# Patient Record
Sex: Male | Born: 1960
Health system: Southern US, Community
[De-identification: ages and names within clinical notes are randomized; demographics above are authoritative.]

## PROBLEM LIST (undated history)

## (undated) DIAGNOSIS — F329 Major depressive disorder, single episode, unspecified: Secondary | ICD-10-CM

## (undated) DIAGNOSIS — E785 Hyperlipidemia, unspecified: Secondary | ICD-10-CM

## (undated) DIAGNOSIS — K2970 Gastritis, unspecified, without bleeding: Secondary | ICD-10-CM

## (undated) DIAGNOSIS — Z8619 Personal history of other infectious and parasitic diseases: Secondary | ICD-10-CM

## (undated) DIAGNOSIS — M7500 Adhesive capsulitis of unspecified shoulder: Secondary | ICD-10-CM

## (undated) DIAGNOSIS — F419 Anxiety disorder, unspecified: Secondary | ICD-10-CM

## (undated) DIAGNOSIS — F32A Depression, unspecified: Secondary | ICD-10-CM

## (undated) DIAGNOSIS — B009 Herpesviral infection, unspecified: Secondary | ICD-10-CM

## (undated) DIAGNOSIS — I10 Essential (primary) hypertension: Secondary | ICD-10-CM

## (undated) DIAGNOSIS — C4491 Basal cell carcinoma of skin, unspecified: Secondary | ICD-10-CM

## (undated) DIAGNOSIS — I5189 Other ill-defined heart diseases: Secondary | ICD-10-CM

## (undated) HISTORY — PX: UPPER GASTROINTESTINAL ENDOSCOPY: SHX188

## (undated) HISTORY — DX: Gastritis, unspecified, without bleeding: K29.70

## (undated) HISTORY — DX: Basal cell carcinoma of skin, unspecified: C44.91

## (undated) HISTORY — DX: Hyperlipidemia, unspecified: E78.5

## (undated) HISTORY — DX: Major depressive disorder, single episode, unspecified: F32.9

## (undated) HISTORY — DX: Herpesviral infection, unspecified: B00.9

## (undated) HISTORY — DX: Depression, unspecified: F32.A

## (undated) HISTORY — DX: Other ill-defined heart diseases: I51.89

## (undated) HISTORY — DX: Personal history of other infectious and parasitic diseases: Z86.19

## (undated) HISTORY — DX: Adhesive capsulitis of unspecified shoulder: M75.00

## (undated) HISTORY — PX: COLONOSCOPY: SHX174

## (undated) HISTORY — DX: Essential (primary) hypertension: I10

## (undated) HISTORY — DX: Anxiety disorder, unspecified: F41.9

---

## 1998-06-12 HISTORY — PX: KNEE SURGERY: SHX244

## 2004-06-12 DIAGNOSIS — C4491 Basal cell carcinoma of skin, unspecified: Secondary | ICD-10-CM

## 2004-06-12 HISTORY — DX: Basal cell carcinoma of skin, unspecified: C44.91

## 2009-05-05 ENCOUNTER — Emergency Department (HOSPITAL_COMMUNITY): Admission: EM | Admit: 2009-05-05 | Discharge: 2009-05-05 | Payer: Self-pay | Admitting: Emergency Medicine

## 2010-09-14 LAB — DIFFERENTIAL
Eosinophils Relative: 1 % (ref 0–5)
Lymphocytes Relative: 37 % (ref 12–46)
Lymphs Abs: 2.6 10*3/uL (ref 0.7–4.0)
Neutro Abs: 3.8 10*3/uL (ref 1.7–7.7)
Neutrophils Relative %: 54 % (ref 43–77)

## 2010-09-14 LAB — CBC
MCHC: 34.8 g/dL (ref 30.0–36.0)
MCV: 90.2 fL (ref 78.0–100.0)
RBC: 5.19 MIL/uL (ref 4.22–5.81)

## 2010-09-14 LAB — COMPREHENSIVE METABOLIC PANEL
AST: 21 U/L (ref 0–37)
BUN: 11 mg/dL (ref 6–23)
CO2: 25 mEq/L (ref 19–32)
Calcium: 9.5 mg/dL (ref 8.4–10.5)
Creatinine, Ser: 1.02 mg/dL (ref 0.4–1.5)
GFR calc Af Amer: >60 mL/min (ref >60)
GFR calc non Af Amer: >60 mL/min (ref >60)

## 2010-09-14 LAB — RAPID URINE DRUG SCREEN, HOSP PERFORMED
Barbiturates: NOT DETECTED
Benzodiazepines: POSITIVE — AB
Cocaine: NOT DETECTED
Opiates: NOT DETECTED

## 2010-09-14 LAB — URINALYSIS, ROUTINE W REFLEX MICROSCOPIC
Glucose, UA: NEGATIVE mg/dL
Nitrite: NEGATIVE
Specific Gravity, Urine: 1.009 (ref 1.005–1.030)
pH: 5.5 (ref 5.0–8.0)

## 2012-10-08 DIAGNOSIS — W57XXXA Bitten or stung by nonvenomous insect and other nonvenomous arthropods, initial encounter: Secondary | ICD-10-CM | POA: Insufficient documentation

## 2013-02-27 DIAGNOSIS — S6000XA Contusion of unspecified finger without damage to nail, initial encounter: Secondary | ICD-10-CM | POA: Insufficient documentation

## 2014-08-05 ENCOUNTER — Encounter: Payer: Self-pay | Admitting: Family

## 2014-08-05 ENCOUNTER — Ambulatory Visit (INDEPENDENT_AMBULATORY_CARE_PROVIDER_SITE_OTHER): Payer: BLUE CROSS/BLUE SHIELD | Admitting: Family

## 2014-08-05 VITALS — BP 142/96 | HR 84 | Temp 98.5°F | Resp 16 | Ht 71.5 in | Wt 202.0 lb

## 2014-08-05 DIAGNOSIS — I10 Essential (primary) hypertension: Secondary | ICD-10-CM

## 2014-08-05 DIAGNOSIS — F32A Depression, unspecified: Secondary | ICD-10-CM

## 2014-08-05 DIAGNOSIS — E785 Hyperlipidemia, unspecified: Secondary | ICD-10-CM

## 2014-08-05 DIAGNOSIS — G4733 Obstructive sleep apnea (adult) (pediatric): Secondary | ICD-10-CM

## 2014-08-05 DIAGNOSIS — R0902 Hypoxemia: Secondary | ICD-10-CM | POA: Insufficient documentation

## 2014-08-05 DIAGNOSIS — F329 Major depressive disorder, single episode, unspecified: Secondary | ICD-10-CM

## 2014-08-05 HISTORY — DX: Essential (primary) hypertension: I10

## 2014-08-05 NOTE — Progress Notes (Signed)
Subjective:    Patient ID: Francisco Lopez, male    DOB: [birth year], 55 y.o.   MRN: [id number]  HPI  Mr.  Lopez is a 54 yr old male who presents today to establish care.   Patient is currently maintained on the following medications for blood pressure: caduet Patient reports good compliance with blood pressure medications. Patient denies chest pain, shortness of breath or swelling. Last 3 blood pressure readings in our office are as follows: BP Readings from Last 3 Encounters:  08/05/14 142/96   Depression- maintained on seroquel/effexor. Reports that when he moved from [place] to [place].  His job was moved and ahe had to drive 297 miles ago. This was 8 years ago.  Had some depression at this time. Reports that he was on ambien for sleep but made him sleep walk.  Did not tolerate the ambien and was placed on seroquel for sleep.  He has been on these meds for about 5 years.  He reports that hs mood is good.  He is working with a Transport planner.  He is in the middle of a divorce after 30 years.  Thinks that his wife is schizophrenic.  Looks forward to doing things.  Brother committed suicide at age 53.  He reports that he had a bad childhood.  Pt left home at 16.  Pt denies past hx of SI.  Denies hx of manic episodes.    Hyperlipidemia- on caduet 5-20.   OSA- on cpap.  Reports that he has used CPAP off/on x 6-7 years. Reports that he had a sleep study and was told that he had some apnea.  Reports that his last sleep study was 10 yrs ago. Reports old machine.     Sees Francisco Lopez for skin screening. Has hx basal cell carcinoma  Review of Systems  Constitutional:       Weight: 202 lb (91.627 kg)  Was 195 1 year ag  HENT: Negative for hearing loss and rhinorrhea.   Eyes: Negative for visual disturbance.  Respiratory: Negative for cough and shortness of breath.   Cardiovascular: Negative for chest pain.  Gastrointestinal: Negative for nausea, vomiting and diarrhea.  Genitourinary: Negative  for dysuria.  Musculoskeletal:       Mild arthritis in hands  Skin: Negative for rash.  Neurological: Negative for headaches.  Hematological: Negative for adenopathy.  Psychiatric/Behavioral:       See HPI       Past Medical History  Diagnosis Date  . History of chicken pox   . Hypertension   . Hyperlipidemia   . Depression   . Basal cell carcinoma 2006    History   Social History  . Marital Status: Married    Spouse Name: N/A  . Number of Children: N/A  . Years of Education: N/A   Occupational History  . Not on file.   Social History Main Topics  . Smoking status: Never Smoker   . Smokeless tobacco: Never Used  . Alcohol Use: No  . Drug Use: No  . Sexual Activity: Not on file   Other Topics Concern  . 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 Knee surgery Left 2000    meninscus repair    Family History  Problem Relation Age  of Onset  . Diabetes Mother     type II  . Hypertension Father     No Known Allergies  No current outpatient prescriptions on file prior to visit.   No current facility-administered medications on file prior to visit.    BP 142/96 mmHg  Pulse 84  Temp(Src) 98.5 F (36.9 C) (Oral)  Resp 16  Ht 5' 11.5" (1.816 m)  Wt 202 lb (91.627 kg)  BMI 27.78 kg/m2  SpO2 98%    Objective:   Physical Exam  Constitutional: He is oriented to person, place, and time. He appears well-developed and well-nourished. No distress.  HENT:  Head: Normocephalic and atraumatic.  Right Ear: Tympanic membrane and ear canal normal.  Left Ear: Tympanic membrane and ear canal normal.  Mouth/Throat: No oropharyngeal exudate, posterior oropharyngeal edema or posterior oropharyngeal erythema.  Eyes: No scleral icterus.  Cardiovascular: Normal rate and regular rhythm.   No  murmur heard. Pulmonary/Chest: Effort normal and breath sounds normal. No respiratory distress. He has no wheezes. He has no rales.  Musculoskeletal: He exhibits no edema.  Lymphadenopathy:    He has no cervical adenopathy.  Neurological: He is alert and oriented to person, place, and time.  Skin: Skin is warm and dry.  Psychiatric: He has a normal mood and affect. His behavior is normal. Thought content normal.          Assessment & Plan:  Will request old records.

## 2014-08-05 NOTE — Assessment & Plan Note (Signed)
BP is slightly elevated. Please continue current dose of caduet. If BP still elevated next visit, consider adjusting BP meds.

## 2014-08-05 NOTE — Assessment & Plan Note (Signed)
Sounds like it was largely situational. He was only using seroquel prn sleep.  He would like to try to come off of seroquel. Advised pt to taper off as outlined in AVS and to contact us if he has any mood changes during/after taper.  Continue effexor for now. He ultimately wants to try to come off effexor as well. Will re-address next visit.

## 2014-08-05 NOTE — Patient Instructions (Addendum)
Decrease seroquel to 25mg  (1/2 tab) once daily for 2 weeks, then every other day for 2 weeks, then stop. Please follow up in 6 weeks for a complete physical.  Come fasting to this appointment.

## 2014-08-05 NOTE — Progress Notes (Signed)
Pre visit review using our clinic review tool, if applicable. No additional management support is needed unless otherwise documented below in the visit note. 

## 2014-08-05 NOTE — Assessment & Plan Note (Signed)
Will refer for sleep study.  Pt wants to know if it is still necessary for him to use cpap.

## 2014-08-05 NOTE — Assessment & Plan Note (Signed)
Brings FLP from his previous provider.  Total cholesterol 175, trigs 257.  Plan to continue caduet, repeat flp next visit.

## 2014-08-27 ENCOUNTER — Telehealth: Payer: Self-pay | Admitting: Family

## 2014-08-27 NOTE — Telephone Encounter (Signed)
Pre Visit letter sent  °

## 2014-09-15 ENCOUNTER — Telehealth: Payer: Self-pay | Admitting: *Deleted

## 2014-09-15 NOTE — Telephone Encounter (Signed)
lmovm

## 2014-09-16 ENCOUNTER — Ambulatory Visit (INDEPENDENT_AMBULATORY_CARE_PROVIDER_SITE_OTHER): Payer: BLUE CROSS/BLUE SHIELD | Admitting: Family

## 2014-09-16 ENCOUNTER — Encounter: Payer: Self-pay | Admitting: Family

## 2014-09-16 VITALS — BP 150/92 | HR 77 | Temp 98.0°F | Resp 16 | Ht 71.5 in | Wt 202.8 lb

## 2014-09-16 DIAGNOSIS — Z0000 Encounter for general adult medical examination without abnormal findings: Secondary | ICD-10-CM | POA: Insufficient documentation

## 2014-09-16 DIAGNOSIS — R3915 Urgency of urination: Secondary | ICD-10-CM

## 2014-09-16 DIAGNOSIS — N529 Male erectile dysfunction, unspecified: Secondary | ICD-10-CM

## 2014-09-16 DIAGNOSIS — I10 Essential (primary) hypertension: Secondary | ICD-10-CM | POA: Diagnosis not present

## 2014-09-16 HISTORY — DX: Male erectile dysfunction, unspecified: N52.9

## 2014-09-16 HISTORY — DX: Encounter for general adult medical examination without abnormal findings: Z00.00

## 2014-09-16 LAB — LIPID PANEL
CHOLESTEROL: 177 mg/dL (ref 0–200)
HDL: 46.2 mg/dL (ref >39.00)
NonHDL: 130.8
TRIGLYCERIDES: 224 mg/dL — AB (ref 0.0–149.0)
Total CHOL/HDL Ratio: 4
VLDL: 44.8 mg/dL — ABNORMAL HIGH (ref 0.0–40.0)

## 2014-09-16 LAB — TSH: TSH: 2.64 u[IU]/mL (ref 0.35–4.50)

## 2014-09-16 LAB — LDL CHOLESTEROL, DIRECT: Direct LDL: 111 mg/dL

## 2014-09-16 MED ORDER — AMLODIPINE-ATORVASTATIN 10-20 MG PO TABS: 1.0000 | ORAL_TABLET | Freq: Every day | ORAL | Status: DC

## 2014-09-16 NOTE — Progress Notes (Signed)
Pre visit review using our clinic review tool, if applicable. No additional management support is needed unless otherwise documented below in the visit note. 

## 2014-09-16 NOTE — Assessment & Plan Note (Signed)
?   BPH, will refer to urology.

## 2014-09-16 NOTE — Progress Notes (Signed)
Subjective:    Patient ID: [name], male    DOB: Jan 30, 1961, 54 y.o.   MRN: [id number]  HPI  Mr. Dunlevy is a 54 yr old male who presents today for cpx.  Patient presents today for complete physical.  Immunizations: up to date Diet: trying to eat a healthy diet Exercise:  Reports that he is trying to walk more.   Colonoscopy: 2013- was told normal.    Trying to wean off of seroquel for sleep.  He is cutting in half, still sleeping ok on this dose.    ED- has tried cialis and levitra without improvement- some issues holding his urine- urgency, then urinates and only has to go "a little bit."    HTN- reports + compliance with caduet BP Readings from Last 3 Encounters:  09/16/14 150/92  08/05/14 142/96      Review of Systems  Constitutional: Negative for unexpected weight change.  HENT: Negative for rhinorrhea.   Respiratory: Negative for cough and shortness of breath.   Cardiovascular: Negative for chest pain and leg swelling.  Gastrointestinal: Negative for diarrhea and constipation.       Occasional gerd  Genitourinary: Negative for dysuria.  Musculoskeletal:       Mild back pain  Skin: Negative for rash.  Neurological: Negative for headaches.  Hematological: Negative for adenopathy.  Psychiatric/Behavioral:       Denies depression/anxiety       Past Medical History  Diagnosis Date  . History of chicken pox   . Hypertension   . Hyperlipidemia   . Depression   . Basal cell carcinoma 2006    History   Social History  . Marital Status: Married    Spouse Name: N/A  . Number of Children: N/A  . Years of Education: N/A   Occupational History  . Not on file.   Social History Main Topics  . Smoking status: Never Smoker   . Smokeless tobacco: Never Used  . Alcohol Use: No  . Drug Use: No  . Sexual Activity: Not on file   Other Topics Concern  . 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 Knee surgery Left 2000    meninscus repair    Family History  Problem Relation Age of Onset  . Diabetes Mother     type II  . Hypertension Father     No Known Allergies  Current Outpatient Prescriptions on File Prior to Visit  Medication Sig Dispense Refill  . QUEtiapine (SEROQUEL) 50 MG tablet Take 25 mg by mouth daily.   2  . venlafaxine (EFFEXOR) 75 MG tablet Take 75 mg by mouth 2 (two) times daily.  12   No current facility-administered medications on file prior to visit.    BP 150/92 mmHg  Pulse 77  Temp(Src) 98 F (36.7 C) (Oral)  Resp 16  Ht 5' 11.5" (1.816 m)  Wt 202 lb 12.8 oz (91.989 kg)  BMI 27.89 kg/m2  SpO2 99%    Objective:   Physical Exam  Constitutional: He is oriented to person, place, and time. He appears well-developed and well-nourished. No distress.  HENT:  Head: Normocephalic and atraumatic.  Right Ear: Tympanic membrane and ear canal normal.  Left Ear: Tympanic membrane and ear canal normal.  Mouth/Throat:  No oropharyngeal exudate or posterior oropharyngeal edema.  Cardiovascular: Normal rate and regular rhythm.   No murmur heard. Pulmonary/Chest: Effort normal and breath sounds normal. No respiratory distress. He has no wheezes. He has no rales.  Abdominal: Soft. Bowel sounds are normal.  Musculoskeletal: He exhibits no edema.  Lymphadenopathy:    He has no cervical adenopathy.  Neurological: He is alert and oriented to person, place, and time.  Reflex Scores:      Patellar reflexes are 2+ on the right side and 2+ on the left side. Skin: Skin is warm and dry.  Psychiatric: He has a normal mood and affect. His behavior is normal. Thought content normal.          Assessment & Plan:

## 2014-09-16 NOTE — Patient Instructions (Addendum)
Complete lab work prior to leaving. Increase caduet dose to 10-20. You will be contacted about your referral to urology.  Please let us know if you have not heard back within 1 week about your referral. Please schedule a follow up appointment in 1 month.

## 2014-09-16 NOTE — Assessment & Plan Note (Addendum)
Discussed healthy diet, exercise.  Obtain routine lab work (had some labs including normal PSA completed 1/16 with another provider and these are reviewed).

## 2014-09-16 NOTE — Assessment & Plan Note (Signed)
Uncontrolled, increase caduet from 5-20 to 10-20.

## 2014-09-16 NOTE — Assessment & Plan Note (Signed)
No improvement with cialis or viagra.  Will refer to urology.

## 2014-09-17 ENCOUNTER — Encounter: Payer: Self-pay | Admitting: Family

## 2014-09-17 NOTE — Addendum Note (Signed)
Addended by: [name] on: 09/17/2014 09:46 PM   Modules accepted: Level of Service, SmartSet

## 2014-10-07 ENCOUNTER — Telehealth: Payer: Self-pay | Admitting: Family

## 2014-10-07 NOTE — Telephone Encounter (Signed)
Francisco Lopez, could you please arrange referral to Dr. Jonette Eva?

## 2014-10-07 NOTE — Telephone Encounter (Signed)
Caller name: Demetries Relation to pt: self Call back number: [phone] Pharmacy:  Reason for call:   Patient does not want to go to Dr. Dorina Hoyer. He is requesting referral to be placed to Dr. Jonette Eva in [facility]

## 2014-10-09 NOTE — Telephone Encounter (Signed)
Pt is schedule with Dr Nevada Crane on 10/19/14 @10am , pt aware

## 2014-10-19 ENCOUNTER — Ambulatory Visit: Payer: BLUE CROSS/BLUE SHIELD | Admitting: Family

## 2014-10-19 ENCOUNTER — Encounter: Payer: Self-pay | Admitting: Family

## 2014-10-19 ENCOUNTER — Ambulatory Visit (INDEPENDENT_AMBULATORY_CARE_PROVIDER_SITE_OTHER): Payer: BLUE CROSS/BLUE SHIELD | Admitting: Family

## 2014-10-19 ENCOUNTER — Telehealth: Payer: Self-pay | Admitting: Family

## 2014-10-19 VITALS — BP 140/86 | HR 87 | Temp 98.5°F | Resp 16 | Ht 71.5 in | Wt 193.8 lb

## 2014-10-19 DIAGNOSIS — I10 Essential (primary) hypertension: Secondary | ICD-10-CM

## 2014-10-19 DIAGNOSIS — N401 Enlarged prostate with lower urinary tract symptoms: Secondary | ICD-10-CM

## 2014-10-19 DIAGNOSIS — Z0000 Encounter for general adult medical examination without abnormal findings: Secondary | ICD-10-CM

## 2014-10-19 DIAGNOSIS — E785 Hyperlipidemia, unspecified: Secondary | ICD-10-CM

## 2014-10-19 DIAGNOSIS — N138 Other obstructive and reflux uropathy: Secondary | ICD-10-CM | POA: Insufficient documentation

## 2014-10-19 DIAGNOSIS — G4733 Obstructive sleep apnea (adult) (pediatric): Secondary | ICD-10-CM | POA: Diagnosis not present

## 2014-10-19 HISTORY — DX: Other obstructive and reflux uropathy: N13.8

## 2014-10-19 MED ORDER — VENLAFAXINE HCL 75 MG PO TABS: 75.0000 mg | ORAL_TABLET | Freq: Two times a day (BID) | ORAL | Status: DC

## 2014-10-19 NOTE — Progress Notes (Signed)
Pre visit review using our clinic review tool, if applicable. No additional management support is needed unless otherwise documented below in the visit note. 

## 2014-10-19 NOTE — Telephone Encounter (Signed)
Pt asked if he could have full panel of blood work done prior to next appt (sched for 01/19/15 2:00pm). Please follow up.

## 2014-10-19 NOTE — Assessment & Plan Note (Signed)
BP improved. Continue current meds.

## 2014-10-19 NOTE — Patient Instructions (Signed)
Continue Caduet 10-20.  Follow up in 3 months.

## 2014-10-19 NOTE — Telephone Encounter (Signed)
Left message for pt to return my call and clarify if there are any specific tests that he is wanting completed.

## 2014-10-19 NOTE — Progress Notes (Signed)
   Subjective:    Patient ID: Francisco Lopez, male    DOB: February 19, 1961, 54 y.o.   MRN: [id number]  HPI   Francisco Lopez is a 54 yr old male who presents today for follow up.  HTN-  On caduet 10-20.  Denies CP/SOB  Or swelling. Tolerating dose increase without difficulty.   BP Readings from Last 3 Encounters:  10/19/14 140/86  09/16/14 150/92  08/05/14 142/96   He is now off of seroquel which he has used for sleep.   Reports that he use nyquil prn. Sleeping ok.  Using a breath right strip which is really helping him. Has a sleep study tomorrow.      Review of Systems See HPI  Past Medical History  Diagnosis Date  . History of chicken pox   . Hypertension   . Hyperlipidemia   . Depression   . Basal cell carcinoma 2006    History   Social History  . Marital Status: Married    Spouse Name: N/A  . Number of Children: N/A  . Years of Education: N/A   Occupational History  . Not on file.   Social History Main Topics  . Smoking status: Never Smoker   . Smokeless tobacco: Never Used  . Alcohol Use: No  . Drug Use: No  . Sexual Activity: Not on file   Other Topics Concern  . 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 Knee surgery Left 2000    meninscus repair    Family History  Problem Relation Age of Onset  . Diabetes Mother     type II  . Hypertension Father     No Known Allergies  Current Outpatient Prescriptions on File Prior to Visit  Medication Sig Dispense Refill  . amlodipine-atorvastatin (CADUET) 10-20 MG per tablet Take 1 tablet by mouth daily. 30 tablet 3  . venlafaxine (EFFEXOR) 75 MG tablet Take 75 mg by mouth 2 (two) times daily.  12   No current facility-administered medications on file prior to visit.    BP 140/86 mmHg  Pulse 87  Temp(Src) 98.5 F (36.9 C) (Oral)   Resp 16  Ht 5' 11.5" (1.816 m)  Wt 193 lb 12.8 oz (87.907 kg)  BMI 26.66 kg/m2  SpO2 98%       Objective:   Physical Exam  Constitutional: He is oriented to person, place, and time. He appears well-developed and well-nourished. No distress.  HENT:  Head: Normocephalic and atraumatic.  Cardiovascular: Normal rate and regular rhythm.   No murmur heard. Pulmonary/Chest: Effort normal and breath sounds normal. No respiratory distress. He has no wheezes. He has no rales. He exhibits no tenderness.  Neurological: He is alert and oriented to person, place, and time.  Psychiatric: He has a normal mood and affect. His behavior is normal. Judgment and thought content normal.          Assessment & Plan:

## 2014-10-19 NOTE — Assessment & Plan Note (Addendum)
Await follow up sleep study. Sleeping ok, despite discontinuation of seroquel.

## 2014-10-20 ENCOUNTER — Ambulatory Visit (HOSPITAL_BASED_OUTPATIENT_CLINIC_OR_DEPARTMENT_OTHER): Payer: BLUE CROSS/BLUE SHIELD | Attending: Family

## 2014-10-20 VITALS — Ht 71.0 in | Wt 193.0 lb

## 2014-10-20 DIAGNOSIS — R5383 Other fatigue: Secondary | ICD-10-CM | POA: Insufficient documentation

## 2014-10-20 DIAGNOSIS — R0683 Snoring: Secondary | ICD-10-CM | POA: Diagnosis not present

## 2014-10-20 DIAGNOSIS — I10 Essential (primary) hypertension: Secondary | ICD-10-CM | POA: Insufficient documentation

## 2014-10-20 DIAGNOSIS — G4733 Obstructive sleep apnea (adult) (pediatric): Secondary | ICD-10-CM

## 2014-10-20 NOTE — Telephone Encounter (Signed)
Pt returned my call. States he is not currently having symptoms. Wants to have screening herpes, HIV and STD testing 1 week prior to his f/u in August. Please advise.

## 2014-10-20 NOTE — Telephone Encounter (Signed)
Orders have been placed.

## 2014-10-26 DIAGNOSIS — R0902 Hypoxemia: Secondary | ICD-10-CM | POA: Diagnosis not present

## 2014-10-27 ENCOUNTER — Telehealth: Payer: Self-pay | Admitting: Family

## 2014-10-27 NOTE — Sleep Study (Signed)
[place]   NAME: Francisco Lopez  DATE OF BIRTH: 1960-08-05  MEDICAL RECORD [id number]  LOCATION: [facility]   PHYSICIAN: Arnette Driggs V.   DATE OF STUDY: 10/20/14   SLEEP STUDY TYPE: Nocturnal Polysomnogram   REFERRING PHYSICIAN: Rigoberto Noel, MD   INDICATION FOR STUDY:  54 year old hypertensive with excessive daytime fatigue, loud snoring and unrefreshing sleep. At the time of this study ,they weighed 200 pounds with a height of 5 ft 11 inches and the BMI of 28, neck size of 16 inches. Epworth sleepiness score was 6   This nocturnal polysomnogram was performed with a sleep technologist in attendance. EEG, EOG,EMG and respiratory parameters recorded. Sleep stages, arousals, limb movements and respiratory data was scored according to criteria laid out by the American Academy of sleep medicine.   SLEEP ARCHITECTURE: Lights out was at 20-26 PM and lights on was at 523 AM. Total sleep time was 379 minutes with a sleep period time of 402 minutes and a sleep efficiency of 91 %. Sleep latency was 15 minutes with latency to REM sleep of 178 minutes and wake after sleep onset of 22 minutes. . Sleep stages as a percentage of total sleep time was N1 -7 %,N2- 82 % and REM sleep 10 % ( 38 minutes) . The longest period of REM sleep was around 4 AM.   AROUSAL DATA : There were 48  arousals with an arousal index of 7.5 events per hour. Most of these were spontaneous & 2 were associated with respiratory events  RESPIRATORY DATA: There were 1 obstructive apneas, 4 central apneas, 0 mixed apneas and 11 hypopneas with apnea -hypopnea index of 2.5 events per hour. There were 6 RERAs with an RDI of 3.5 events per hour. There was no relation to sleep stage or body position. Supine sleep was noted  MOVEMENT/PARASOMNIA: There were 23 PLMS with a PLM index of 3.6 events per hour. The PLM arousal index was 0 per hour.  OXYGEN DATA: The lowest desaturation was 87 %  during REM sleep and the desaturation index was 5 per hour.   CARDIAC DATA: The low heart rate was 32 beats per minute. The high heart rate recorded was an artifact. No arrhythmias were noted   DISCUSSION -Loud snoring was noted . He did not meet criteria for CPAP intervention. He was desensitized with a  medium fullface mask  IMPRESSION :  1.  No evidence of obstructive sleep apnea .  Mild oxygen desaturation  Was noted. 2. No evidence of cardiac arrhythmias,periodic limb movements or behavioral disturbance during sleep.  3. Sleep efficiency was acceptable  RECOMMENDATION:  1. No treatment required for this degree of sleep-disordered breathing. He should be evaluated for underlying cardio pulmonary disease to explain desaturations without respiratory events. 2. Patient should be cautioned against driving when sleepy  3. They should be asked to avoid medications with sedative side effects    Zenab Gronewold V.  Diplomate, [employer] of Sleep Medicine    ELECTRONICALLY SIGNED ON: 10/27/2014  [facility]  PH: [phone] FX: [phone]  [place]

## 2014-10-27 NOTE — Telephone Encounter (Signed)
Please let pt know that sleep study was negative for sleep apnea.

## 2014-10-27 NOTE — Telephone Encounter (Signed)
Left detailed message on pt's cell# re: below information and to call if any questions.

## 2014-12-08 ENCOUNTER — Ambulatory Visit: Payer: BLUE CROSS/BLUE SHIELD | Admitting: Urology

## 2015-01-13 ENCOUNTER — Other Ambulatory Visit: Payer: Self-pay | Admitting: Family

## 2015-01-19 ENCOUNTER — Ambulatory Visit: Payer: BLUE CROSS/BLUE SHIELD | Admitting: Family

## 2015-01-26 ENCOUNTER — Ambulatory Visit (INDEPENDENT_AMBULATORY_CARE_PROVIDER_SITE_OTHER): Payer: BLUE CROSS/BLUE SHIELD | Admitting: Family

## 2015-01-26 ENCOUNTER — Ambulatory Visit (HOSPITAL_BASED_OUTPATIENT_CLINIC_OR_DEPARTMENT_OTHER)
Admission: RE | Admit: 2015-01-26 | Discharge: 2015-01-26 | Disposition: A | Discharge status: Home / Self Care | Payer: BLUE CROSS/BLUE SHIELD | Source: Ambulatory Visit | Attending: Family | Admitting: Family

## 2015-01-26 ENCOUNTER — Encounter: Payer: Self-pay | Admitting: Family

## 2015-01-26 VITALS — BP 140/84 | HR 78 | Temp 99.0°F | Resp 16 | Ht 71.5 in | Wt 193.0 lb

## 2015-01-26 DIAGNOSIS — J029 Acute pharyngitis, unspecified: Secondary | ICD-10-CM

## 2015-01-26 DIAGNOSIS — I10 Essential (primary) hypertension: Secondary | ICD-10-CM | POA: Diagnosis not present

## 2015-01-26 DIAGNOSIS — Z0000 Encounter for general adult medical examination without abnormal findings: Secondary | ICD-10-CM

## 2015-01-26 DIAGNOSIS — R0902 Hypoxemia: Secondary | ICD-10-CM | POA: Diagnosis not present

## 2015-01-26 LAB — BASIC METABOLIC PANEL
BUN: 18 mg/dL (ref 6–23)
CALCIUM: 9.9 mg/dL (ref 8.4–10.5)
CO2: 27 mEq/L (ref 19–32)
Chloride: 105 mEq/L (ref 96–112)
Creatinine, Ser: 0.92 mg/dL (ref 0.40–1.50)
GFR: 91.08 mL/min (ref >60.00)
GLUCOSE: 111 mg/dL — AB (ref 70–99)
Potassium: 3.7 mEq/L (ref 3.5–5.1)
SODIUM: 138 meq/L (ref 135–145)

## 2015-01-26 LAB — POCT RAPID STREP A (OFFICE): Rapid Strep A Screen: NEGATIVE

## 2015-01-26 NOTE — Assessment & Plan Note (Signed)
Patient with mild oxygen desat on sleep study (87%). Will obtain a 2D echo to evaluate cardiac function.  Pt is a non-smoker without any hx of asthma symptoms. Check baseline chest x ray.

## 2015-01-26 NOTE — Patient Instructions (Signed)
Please complete lab work prior to leaving. You may use tylenol or motrin as needed for throat pain. We will call you with the results of your strep test and about scheduling your echocardiogram.

## 2015-01-26 NOTE — Progress Notes (Signed)
Subjective:    Patient ID: Francisco Lopez, male    DOB: 25-Jun-1960, 54 y.o.   MRN: [id number]  HPI  Francisco Lopez is a 54 yr old male who presents today for follow up.  1) HTN- maintained on Caduet.   BP Readings from Last 3 Encounters:  01/26/15 140/84  10/19/14 140/86  09/16/14 150/92   2)  Daytime snoring-  the patient underwent sleep study back in May.  Sleep study did not reveal OSA.  He was noted to have mild oxygen desaturation.  It was recommended that he undergo evaluation for underlying cardio pulmonary disease to discuss his desaturations without respiratory events.  Using breath right strips which help a lot with his breathing and snoring. Recently changed his shift and reports getting more sleep. He reports feeling less tired during the day.   3) Sore throat-  Started yesterday am. Denies fever.  Sore throat is moderate.   Review of Systems See HPI  Past Medical History  Diagnosis Date  . History of chicken pox   . Hypertension   . Hyperlipidemia   . Depression   . Basal cell carcinoma 2006    Social History   Social History  . Marital Status: Married    Spouse Name: N/A  . Number of Children: N/A  . Years of Education: N/A   Occupational History  . Not on file.   Social History Main Topics  . Smoking status: Never Smoker   . Smokeless tobacco: Never Used  . Alcohol Use: No  . Drug Use: No  . Sexual Activity: Not on file   Other Topics Concern  . 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 Knee surgery Left 2000    meninscus repair    Family History  Problem Relation Age of Onset  . Diabetes Mother     type II  . Hypertension Father     No Known Allergies  Current Outpatient Prescriptions on File Prior to Visit  Medication Sig Dispense Refill  .  amlodipine-atorvastatin (CADUET) 10-20 MG per tablet TAKE 1 TABLET BY MOUTH DAILY. 30 tablet 0  . tadalafil (CIALIS) 5 MG tablet Take 5 mg by mouth daily as needed.    . venlafaxine (EFFEXOR) 75 MG tablet Take 1 tablet (75 mg total) by mouth 2 (two) times daily. 60 tablet 5   No current facility-administered medications on file prior to visit.    BP 140/84 mmHg  Pulse 78  Temp(Src) 99 F (37.2 C) (Oral)  Resp 16  Ht 5' 11.5" (1.816 m)  Wt 193 lb (87.544 kg)  BMI 26.55 kg/m2  SpO2 97%       Objective:   Physical Exam  Constitutional: He is oriented to person, place, and time. He appears well-developed and well-nourished. No distress.  HENT:  Head: Normocephalic and atraumatic.  Bilateral TM's occluded by cerumen + oropharyngeal erythema R>L no exudate  Cardiovascular: Normal rate and regular rhythm.   No murmur heard. Pulmonary/Chest: Effort normal and breath sounds normal. No respiratory distress. He has no wheezes. He has no rales.  Musculoskeletal: He exhibits no edema.  Neurological: He is alert and oriented to person, place, and time.  Skin: Skin is warm and dry.  Psychiatric: He has a normal mood  and affect. His behavior is normal. Thought content normal.             Assessment & Plan:  Sore throat- ? Viral pharyngitis. Rapid step is neg. Will send Strep to lab to confirm. Advised supportive measures.

## 2015-01-26 NOTE — Progress Notes (Signed)
Pre visit review using our clinic review tool, if applicable. No additional management support is needed unless otherwise documented below in the visit note. 

## 2015-01-26 NOTE — Assessment & Plan Note (Signed)
BP stable on caduet, obtain bmet today.

## 2015-01-27 ENCOUNTER — Telehealth: Payer: Self-pay | Admitting: Family

## 2015-01-27 LAB — GC/CHLAMYDIA PROBE AMP, URINE
CHLAMYDIA, SWAB/URINE, PCR: NEGATIVE
GC Probe Amp, Urine: NEGATIVE

## 2015-01-27 LAB — STREP A DNA PROBE: GASP: NEGATIVE

## 2015-01-27 LAB — HIV ANTIBODY (ROUTINE TESTING W REFLEX): HIV: NONREACTIVE

## 2015-01-27 LAB — RPR

## 2015-01-27 LAB — HSV 2 ANTIBODY, IGG: HSV 2 Glycoprotein G Ab, IgG: 7.91 IV — ABNORMAL HIGH

## 2015-01-27 LAB — HSV 1 ANTIBODY, IGG: HSV 1 Glycoprotein G Ab, IgG: 6.69 IV — ABNORMAL HIGH

## 2015-01-27 NOTE — Telephone Encounter (Signed)
Relation to pt: self Call back number:[phone]   Reason for call:  Patient inquiring about strep test results taken 01/26/15.

## 2015-01-27 NOTE — Telephone Encounter (Signed)
Notified pt send out was normal. He reports that his throat is still a little sore but feeling better.

## 2015-01-28 ENCOUNTER — Encounter: Payer: Self-pay | Admitting: Family

## 2015-01-28 DIAGNOSIS — B009 Herpesviral infection, unspecified: Secondary | ICD-10-CM | POA: Insufficient documentation

## 2015-01-28 HISTORY — DX: Herpesviral infection, unspecified: B00.9

## 2015-01-28 LAB — HSV(HERPES SIMPLEX VRS) I + II AB-IGM: HERPES SIMPLEX VRS I-IGM AB (EIA): 0.54 {index}

## 2015-02-01 NOTE — Telephone Encounter (Signed)
Relation to pt: self  Call back number:[phone]  Reason for call:  Francisco Lopez he was told to call you Monday morning

## 2015-02-01 NOTE — Telephone Encounter (Signed)
Left a message for call back.  

## 2015-02-01 NOTE — Telephone Encounter (Signed)
Left message for pt to return my call.  Notes Recorded by [name], NP on 01/28/2015 at 5:04 PM Lab work shows + for herpes type 1 (oral) and herpes type 2 (genital) infection. These infections are not new. It is possible to pass to partner even without presence of sores. If he has sores he should let us know and we can send valtrex to clear them up sooner. Gonorrhea/chlamydia screening is negative. Syphilis, HIV and strep testing negative.

## 2015-02-02 NOTE — Telephone Encounter (Signed)
Notified pt and he voices understanding. 

## 2015-02-02 NOTE — Telephone Encounter (Signed)
Left message for pt to return my call and ask staff to place him on hold until I can get to the phone.

## 2015-02-02 NOTE — Telephone Encounter (Signed)
Routing message to Kelle Darting, [employer].

## 2015-02-02 NOTE — Telephone Encounter (Signed)
Pt is returning your call.   CB: [phone]

## 2015-02-03 ENCOUNTER — Ambulatory Visit (HOSPITAL_BASED_OUTPATIENT_CLINIC_OR_DEPARTMENT_OTHER)
Admission: RE | Admit: 2015-02-03 | Discharge: 2015-02-03 | Disposition: A | Discharge status: Home / Self Care | Payer: BLUE CROSS/BLUE SHIELD | Source: Ambulatory Visit | Attending: Family | Admitting: Family

## 2015-02-03 DIAGNOSIS — I071 Rheumatic tricuspid insufficiency: Secondary | ICD-10-CM | POA: Diagnosis not present

## 2015-02-03 DIAGNOSIS — E785 Hyperlipidemia, unspecified: Secondary | ICD-10-CM | POA: Diagnosis present

## 2015-02-03 DIAGNOSIS — R0902 Hypoxemia: Secondary | ICD-10-CM | POA: Diagnosis not present

## 2015-02-03 DIAGNOSIS — I340 Nonrheumatic mitral (valve) insufficiency: Secondary | ICD-10-CM | POA: Diagnosis not present

## 2015-02-03 DIAGNOSIS — I10 Essential (primary) hypertension: Secondary | ICD-10-CM | POA: Diagnosis not present

## 2015-02-03 DIAGNOSIS — I5189 Other ill-defined heart diseases: Secondary | ICD-10-CM | POA: Insufficient documentation

## 2015-02-03 DIAGNOSIS — I517 Cardiomegaly: Secondary | ICD-10-CM | POA: Insufficient documentation

## 2015-02-03 DIAGNOSIS — I358 Other nonrheumatic aortic valve disorders: Secondary | ICD-10-CM | POA: Diagnosis not present

## 2015-02-03 NOTE — Progress Notes (Signed)
  Echocardiogram 2D Echocardiogram has been performed.  Francisco Lopez 02/03/2015, 11:41 AM

## 2015-02-04 ENCOUNTER — Encounter: Payer: Self-pay | Admitting: Family

## 2015-02-04 DIAGNOSIS — I5189 Other ill-defined heart diseases: Secondary | ICD-10-CM

## 2015-02-04 HISTORY — DX: Other ill-defined heart diseases: I51.89

## 2015-02-12 ENCOUNTER — Other Ambulatory Visit: Payer: Self-pay | Admitting: Family

## 2015-02-12 NOTE — Telephone Encounter (Signed)
Pt has f/u 02/24/15. Refill caduet sent to pharmacy.

## 2015-02-24 ENCOUNTER — Ambulatory Visit (INDEPENDENT_AMBULATORY_CARE_PROVIDER_SITE_OTHER): Payer: BLUE CROSS/BLUE SHIELD | Admitting: Family

## 2015-02-24 ENCOUNTER — Telehealth: Payer: Self-pay | Admitting: Family

## 2015-02-24 ENCOUNTER — Encounter: Payer: Self-pay | Admitting: Family

## 2015-02-24 VITALS — BP 144/84 | HR 61 | Temp 98.4°F | Resp 18 | Ht 72.0 in | Wt 193.4 lb

## 2015-02-24 DIAGNOSIS — B009 Herpesviral infection, unspecified: Secondary | ICD-10-CM

## 2015-02-24 DIAGNOSIS — Z23 Encounter for immunization: Secondary | ICD-10-CM

## 2015-02-24 MED ORDER — VALACYCLOVIR HCL 500 MG PO TABS: 500.0000 mg | ORAL_TABLET | Freq: Every day | ORAL | Status: DC

## 2015-02-24 MED ORDER — VALACYCLOVIR HCL 500 MG PO TABS: 500.0000 mg | ORAL_TABLET | Freq: Two times a day (BID) | ORAL | Status: DC

## 2015-02-24 NOTE — Progress Notes (Signed)
   Subjective:    Patient ID: Francisco Lopez, male    DOB: [birth year], 54 y.o.   MRN: [id number]  HPI  Francisco Lopez is a 54 yr old male who presents today to discuss recent result of + HSV 1 and 2 and diastolic dysfunction noted on echo. He reports that he is separated from his wife of 52 years and he has a new sexual partner.    Review of Systems     Past Medical History  Diagnosis Date  . History of chicken pox   . Hypertension   . Hyperlipidemia   . Depression   . Basal cell carcinoma 2006  . Herpes simplex type 1 antibody positive 01/28/2015  . Herpes simplex type 2 infection 01/28/2015  . Diastolic dysfunction 3/73/4287    Per 2D echo 8/16    Social History   Social History  . Marital Status: Legally Separated    Spouse Name: N/A  . Number of Children: N/A  . Years of Education: N/A   Occupational History  . Not on file.   Social History Main Topics  . Smoking status: Never Smoker   . Smokeless tobacco: Never Used  . Alcohol Use: No  . Drug Use: No  . Sexual Activity: Not on file   Other Topics Concern  . 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 Knee surgery Left 2000    meninscus repair    Family History  Problem Relation Age of Onset  . Diabetes Mother     type II  . Hypertension Father     No Known Allergies  Current Outpatient Prescriptions on File Prior to Visit  Medication Sig Dispense Refill  . amlodipine-atorvastatin (CADUET) 10-20 MG per tablet TAKE 1 TABLET BY MOUTH DAILY. 30 tablet 3  . tadalafil (CIALIS) 5 MG tablet Take 5 mg by mouth daily as needed.    . venlafaxine (EFFEXOR) 75 MG tablet Take 1 tablet (75 mg total) by mouth 2 (two) times daily. 60 tablet 5   No current facility-administered medications on file prior to visit.    BP 144/84 mmHg  Pulse 61   Temp(Src) 98.4 F (36.9 C) (Oral)  Resp 18  Ht 6' (1.829 m)  Wt 193 lb 6.4 oz (87.726 kg)  BMI 26.22 kg/m2  SpO2 98%    Objective:   Physical Exam  Constitutional: He is oriented to person, place, and time. He appears well-developed and well-nourished. No distress.  HENT:  Head: Normocephalic and atraumatic.  Neurological: He is alert and oriented to person, place, and time.  Psychiatric: He has a normal mood and affect. His behavior is normal. Thought content normal.          Assessment & Plan:

## 2015-02-24 NOTE — Progress Notes (Signed)
Pre visit review using our clinic review tool, if applicable. No additional management support is needed unless otherwise documented below in the visit note. 

## 2015-02-24 NOTE — Telephone Encounter (Signed)
Left detailed message on cell# and to call and let us know that he received and understands message.

## 2015-02-24 NOTE — Patient Instructions (Signed)
Please start valtrex 500mg  once daily. Follow up in 6 months.

## 2015-02-24 NOTE — Telephone Encounter (Signed)
Please let pt know that rx for valtrex reads BID however he should only take once daily.  Thanks

## 2015-02-25 NOTE — Assessment & Plan Note (Addendum)
20 min spent with pt today.  >50% of this time was spent counseling pt on herpes, treatment, transmission suppression, and discussion of diastolic dysfunction. Discussed importance of safe sex.  Add valtrex 500mg  once daily for suppression/reduction of transmission.

## 2015-02-25 NOTE — Telephone Encounter (Signed)
Patient wanted to notify PCP he received message

## 2015-06-05 ENCOUNTER — Other Ambulatory Visit: Payer: Self-pay | Admitting: Family

## 2015-06-10 ENCOUNTER — Telehealth: Payer: Self-pay | Admitting: Family

## 2015-06-10 NOTE — Telephone Encounter (Signed)
Relation to [id number] Call back number:[phone] Pharmacy:  Reason for call:  Patient requesting a report stating he is no longer using cpap machine to submit to the [facility].. Patient is going to the [facility] tomorrow and would like to pick up letter no later then 1:30pm tomorrow. (Advised patient NP is on vacation).

## 2015-06-18 ENCOUNTER — Encounter: Payer: Self-pay | Admitting: Physician Assistant

## 2015-06-18 ENCOUNTER — Ambulatory Visit (INDEPENDENT_AMBULATORY_CARE_PROVIDER_SITE_OTHER): Payer: BLUE CROSS/BLUE SHIELD | Admitting: Physician Assistant

## 2015-06-18 VITALS — BP 138/82 | HR 87 | Temp 98.4°F | Ht 72.0 in | Wt 198.4 lb

## 2015-06-18 DIAGNOSIS — J029 Acute pharyngitis, unspecified: Secondary | ICD-10-CM | POA: Insufficient documentation

## 2015-06-18 LAB — POCT RAPID STREP A (OFFICE): RAPID STREP A SCREEN: NEGATIVE

## 2015-06-18 MED ORDER — AMOXICILLIN 500 MG PO CAPS: 500.0000 mg | ORAL_CAPSULE | Freq: Two times a day (BID) | ORAL | Status: DC

## 2015-06-18 NOTE — Progress Notes (Signed)
Patient presents to clinic today c/o 3 days of significant sore throat, bilaterally. Endorses PND but denies nasal congestion or sinus pressure. Denies cough or chest congestion. Endorses one night of low-grade fever. Denies recent travel or sick contact but has been around his grandchildren.  Past Medical History  Diagnosis Date  . History of chicken pox   . Hypertension   . Hyperlipidemia   . Depression   . Basal cell carcinoma 2006  . Herpes simplex type 1 antibody positive 01/28/2015  . Herpes simplex type 2 infection 01/28/2015  . Diastolic dysfunction 123456    Per 2D echo 8/16    Current Outpatient Prescriptions on File Prior to Visit  Medication Sig Dispense Refill  . amlodipine-atorvastatin (CADUET) 10-20 MG tablet TAKE 1 TABLET BY MOUTH DAILY. 30 tablet 2  . tadalafil (CIALIS) 5 MG tablet Take 5 mg by mouth daily as needed.    . valACYclovir (VALTREX) 500 MG tablet Take 1 tablet (500 mg total) by mouth daily. 30 tablet 5  . venlafaxine (EFFEXOR) 75 MG tablet Take 1 tablet (75 mg total) by mouth 2 (two) times daily. 60 tablet 5   No current facility-administered medications on file prior to visit.    No Known Allergies  Family History  Problem Relation Age of Onset  . Diabetes Mother     type II  . Hypertension Father     Social History   Social History  . Marital Status: Legally Separated    Spouse Name: N/A  . Number of Children: N/A  . Years of Education: N/A   Social History Main Topics  . Smoking status: Never Smoker   . Smokeless tobacco: Never Used  . Alcohol Use: No  . Drug Use: No  . Sexual Activity: Not Asked   Other Topics Concern  . 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  All other ROS are negative.  BP 138/82 mmHg  Pulse 87  Temp(Src) 98.4 F (36.9 C) (Oral)  Ht 6' (1.829 m)  Wt  198 lb 6.4 oz (89.994 kg)  BMI 26.90 kg/m2  SpO2 98%  Physical Exam  Constitutional: He is oriented to person, place, and time and well-developed, well-nourished, and in no distress.  HENT:  Head: Normocephalic and atraumatic.  Right Ear: Tympanic membrane normal.  Left Ear: Tympanic membrane normal.  Nose: Nose normal.  Mouth/Throat: Uvula is midline and mucous membranes are normal. Oropharyngeal exudate and posterior oropharyngeal erythema present.  Eyes: Conjunctivae are normal.  Cardiovascular: Normal rate, regular rhythm, normal heart sounds and intact distal pulses.   Pulmonary/Chest: Effort normal and breath sounds normal. No respiratory distress. He has no wheezes. He has no rales. He exhibits no tenderness.  Lymphadenopathy:       Head (right side): Tonsillar adenopathy present.       Head (left side): Tonsillar adenopathy present.    He has cervical adenopathy.       Right cervical: Superficial cervical adenopathy present.       Left cervical: Superficial cervical adenopathy present.  Neurological: He is alert and oriented to person, place, and time.  Skin: Skin is warm and dry. No rash noted.  Vitals reviewed.   Recent Results (from the past 2160 hour(s))  POCT rapid strep A     Status:  Normal   Collection Time: 06/18/15  2:07 PM  Result Value Ref Range   Rapid Strep A Screen Negative Negative    Assessment/Plan: Sore throat Rapid strep negative. However patient has had low-grade fever, adenopathy, exudate and tonsillar adenopathy and absence of cough. Will empirically treat for strep. Rx Amoxicillin. Supportive measures reviewed. Follow-up if symptoms are not resolving.

## 2015-06-18 NOTE — Patient Instructions (Signed)
Your rapid test is negative. However on your exam there are signs of strep throat. I am treating you empirically for this with Amoxicillin. Please take as directed with food. Stay well hydrated. Use tylenol for fever or throat pain. Salt-water gargles will be beneficial.  After you have been on medication for a few days, switch out your toothbrush.  Call or return to clinic if symptoms are not resolving.  Strep Throat Strep throat is an infection of the throat. It is caused by germs. Strep throat spreads from person to person because of coughing, sneezing, or close contact. HOME CARE Medicines  Take over-the-counter and prescription medicines only as told by your doctor.  Take your antibiotic medicine as told by your doctor. Do not stop taking the medicine even if you feel better.  Have family members who also have a sore throat or fever go to a doctor. Eating and Drinking  Do not share food, drinking cups, or personal items.  Try eating soft foods until your sore throat feels better.  Drink enough fluid to keep your pee (urine) clear or pale yellow. General Instructions  Rinse your mouth (gargle) with a salt-water mixture 3-4 times per day or as needed. To make a salt-water mixture, stir -1 tsp of salt into 1 cup of warm water.  Make sure that all people in your house wash their hands well.  Rest.  Stay home from school or work until you have been taking antibiotics for 24 hours.  Keep all follow-up visits as told by your doctor. This is important. GET HELP IF:  Your neck keeps getting bigger.  You get a rash, cough, or earache.  You cough up thick liquid that is green, yellow-brown, or bloody.  You have pain that does not get better with medicine.  Your problems get worse instead of getting better.  You have a fever. GET HELP RIGHT AWAY IF:  You throw up (vomit).  You get a very bad headache.  You neck hurts or it feels stiff.  You have chest pain or you are  short of breath.  You have drooling, very bad throat pain, or changes in your voice.  Your neck is swollen or the skin gets red and tender.  Your mouth is dry or you are peeing less than normal.  You keep feeling more tired or it is hard to wake up.  Your joints are red or they hurt.   This information is not intended to replace advice given to you by your health care provider. Make sure you discuss any questions you have with your health care provider.   Document Released: 11/15/2007 Document Revised: 02/17/2015 Document Reviewed: 09/21/2014 Elsevier Interactive Patient Education [employer].

## 2015-06-18 NOTE — Progress Notes (Signed)
Pre visit review using our clinic review tool, if applicable. No additional management support is needed unless otherwise documented below in the visit note. 

## 2015-06-18 NOTE — Assessment & Plan Note (Signed)
Rapid strep negative. However patient has had low-grade fever, adenopathy, exudate and tonsillar adenopathy and absence of cough. Will empirically treat for strep. Rx Amoxicillin. Supportive measures reviewed. Follow-up if symptoms are not resolving.

## 2015-06-23 ENCOUNTER — Encounter: Payer: Self-pay | Admitting: Family

## 2015-06-23 NOTE — Telephone Encounter (Signed)
See letter.

## 2015-06-23 NOTE — Telephone Encounter (Signed)
Left detailed message for pt to return my call and let us know if he is going to pick it up or does he have a fax # for me to send it to?

## 2015-06-30 ENCOUNTER — Encounter: Payer: Self-pay | Admitting: Physician Assistant

## 2015-06-30 ENCOUNTER — Ambulatory Visit (HOSPITAL_BASED_OUTPATIENT_CLINIC_OR_DEPARTMENT_OTHER)
Admission: RE | Admit: 2015-06-30 | Discharge: 2015-06-30 | Disposition: A | Discharge status: Home / Self Care | Payer: BLUE CROSS/BLUE SHIELD | Source: Ambulatory Visit | Attending: Physician Assistant | Admitting: Physician Assistant

## 2015-06-30 ENCOUNTER — Other Ambulatory Visit: Payer: Self-pay | Admitting: Family

## 2015-06-30 ENCOUNTER — Ambulatory Visit (INDEPENDENT_AMBULATORY_CARE_PROVIDER_SITE_OTHER): Payer: BLUE CROSS/BLUE SHIELD | Admitting: Physician Assistant

## 2015-06-30 VITALS — BP 156/91 | HR 78 | Temp 98.2°F | Ht 72.0 in | Wt 195.6 lb

## 2015-06-30 DIAGNOSIS — J00 Acute nasopharyngitis [common cold]: Secondary | ICD-10-CM | POA: Diagnosis not present

## 2015-06-30 DIAGNOSIS — B9689 Other specified bacterial agents as the cause of diseases classified elsewhere: Secondary | ICD-10-CM

## 2015-06-30 DIAGNOSIS — J208 Acute bronchitis due to other specified organisms: Principal | ICD-10-CM

## 2015-06-30 MED ORDER — LEVOFLOXACIN 750 MG PO TABS: 750.0000 mg | ORAL_TABLET | Freq: Every day | ORAL | Status: DC

## 2015-06-30 MED ORDER — BENZONATATE 100 MG PO CAPS: 100.0000 mg | ORAL_CAPSULE | Freq: Two times a day (BID) | ORAL | Status: DC | PRN

## 2015-06-30 NOTE — Assessment & Plan Note (Signed)
Giving lung rales and significant symptoms, concern for pneumonia. CXR will be obtained today. Giving recent antibiotic use and concern for pneumonia, will Rx Levaquin once daily for 7 days. Supportive measures reviewed. Rx Tessalon per cough. Follow-up 1 week.

## 2015-06-30 NOTE — Progress Notes (Signed)
Patient presents to clinic today c/o 10 days of gradually worsening chest congestion, productive cough and fatigue. Denies chest pain or SOB. Denies sinus pressure or sinus pain. Denies recent travel or sick contact.  Past Medical History  Diagnosis Date  . History of chicken pox   . Hypertension   . Hyperlipidemia   . Depression   . Basal cell carcinoma 2006  . Herpes simplex type 1 antibody positive 01/28/2015  . Herpes simplex type 2 infection 01/28/2015  . Diastolic dysfunction 123456    Per 2D echo 8/16    Current Outpatient Prescriptions on File Prior to Visit  Medication Sig Dispense Refill  . amlodipine-atorvastatin (CADUET) 10-20 MG tablet TAKE 1 TABLET BY MOUTH DAILY. 30 tablet 2  . tadalafil (CIALIS) 5 MG tablet Take 5 mg by mouth daily as needed.    . valACYclovir (VALTREX) 500 MG tablet Take 1 tablet (500 mg total) by mouth daily. 30 tablet 5  . venlafaxine (EFFEXOR) 75 MG tablet Take 1 tablet (75 mg total) by mouth 2 (two) times daily. 60 tablet 5   No current facility-administered medications on file prior to visit.    No Known Allergies  Family History  Problem Relation Age of Onset  . Diabetes Mother     type II  . Hypertension Father     Social History   Social History  . Marital Status: Legally Separated    Spouse Name: N/A  . Number of Children: N/A  . Years of Education: N/A   Social History Main Topics  . Smoking status: Never Smoker   . Smokeless tobacco: Never Used  . Alcohol Use: No  . Drug Use: No  . Sexual Activity: Not Asked   Other Topics Concern  . 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  All other ROS are negative.  BP 156/91 mmHg  Pulse 78  Temp(Src) 98.2 F (36.8 C) (Oral)  Ht 6' (1.829 m)  Wt 195 lb 9.6 oz (88.724 kg)  BMI 26.52 kg/m2  SpO2 97%  Physical Exam    Constitutional: He is oriented to person, place, and time and well-developed, well-nourished, and in no distress.  HENT:  Head: Normocephalic and atraumatic.  Right Ear: External ear normal.  Left Ear: External ear normal.  Mouth/Throat: Oropharynx is clear and moist. No oropharyngeal exudate.  Eyes: Conjunctivae are normal. Pupils are equal, round, and reactive to light.  Neck: Neck supple.  Cardiovascular: Normal rate, regular rhythm, normal heart sounds and intact distal pulses.   Pulmonary/Chest: Effort normal. No respiratory distress. He has no wheezes. He has rales. He exhibits no tenderness.  Neurological: He is alert and oriented to person, place, and time.  Skin: Skin is warm and dry.  Vitals reviewed.   Recent Results (from the past 2160 hour(s))  POCT rapid strep A     Status: Normal   Collection Time: 06/18/15  2:07 PM  Result Value Ref Range   Rapid Strep A Screen Negative Negative    Assessment/Plan: Acute bacterial bronchitis Giving lung rales and significant symptoms, concern for pneumonia. CXR will be obtained today. Giving recent antibiotic use and concern for pneumonia, will Rx Levaquin once daily for 7 days. Supportive measures reviewed. Rx Tessalon per cough. Follow-up 1 week.

## 2015-06-30 NOTE — Progress Notes (Signed)
Pre visit review using our clinic review tool, if applicable. No additional management support is needed unless otherwise documented below in the visit note. 

## 2015-06-30 NOTE — Patient Instructions (Signed)
Please go to the imaging department for an x-ray. I will call you with your results.  Take antibiotic (Levaquin) as directed.  Increase fluids.  Get plenty of rest. Use Mucinex for congestion. Tessalon as directed for cough. Take a daily probiotic (I recommend Align or Culturelle, but even Activia Yogurt may be beneficial).  A humidifier placed in the bedroom may offer some relief for a dry, scratchy throat of nasal irritation.  Read information below on acute bronchitis. Please call or return to clinic if symptoms are not improving.  Acute Bronchitis Bronchitis is when the airways that extend from the windpipe into the lungs get red, puffy, and painful (inflamed). Bronchitis often causes thick spit (mucus) to develop. This leads to a cough. A cough is the most common symptom of bronchitis. In acute bronchitis, the condition usually begins suddenly and goes away over time (usually in 2 weeks). Smoking, allergies, and asthma can make bronchitis worse. Repeated episodes of bronchitis may cause more lung problems.  HOME CARE  Rest.  Drink enough fluids to keep your pee (urine) clear or pale yellow (unless you need to limit fluids as told by your doctor).  Only take over-the-counter or prescription medicines as told by your doctor.  Avoid smoking and secondhand smoke. These can make bronchitis worse. If you are a smoker, think about using nicotine gum or skin patches. Quitting smoking will help your lungs heal faster.  Reduce the chance of getting bronchitis again by:  Washing your hands often.  Avoiding people with cold symptoms.  Trying not to touch your hands to your mouth, nose, or eyes.  Follow up with your doctor as told.  GET HELP IF: Your symptoms do not improve after 1 week of treatment. Symptoms include:  Cough.  Fever.  Coughing up thick spit.  Body aches.  Chest congestion.  Chills.  Shortness of breath.  Sore throat.  GET HELP RIGHT AWAY IF:   You have an  increased fever.  You have chills.  You have severe shortness of breath.  You have bloody thick spit (sputum).  You throw up (vomit) often.  You lose too much body fluid (dehydration).  You have a severe headache.  You faint.  MAKE SURE YOU:   Understand these instructions.  Will watch your condition.  Will get help right away if you are not doing well or get worse. Document Released: 11/15/2007 Document Revised: 01/29/2013 Document Reviewed: 11/19/2012 [facility] Patient Information 2015 [place], [place]. This information is not intended to replace advice given to you by your health care provider. Make sure you discuss any questions you have with your health care provider.

## 2015-09-21 ENCOUNTER — Other Ambulatory Visit: Payer: Self-pay

## 2015-09-21 MED ORDER — AMLODIPINE-ATORVASTATIN 10-20 MG PO TABS: 1.0000 | ORAL_TABLET | Freq: Every day | ORAL | Status: DC

## 2015-10-05 ENCOUNTER — Other Ambulatory Visit: Payer: Self-pay | Admitting: Family

## 2015-10-06 NOTE — Telephone Encounter (Signed)
30 day supply of effexor sent to pharmacy. Pt was due for 6 month follow up in March with Melissa. Please call pt to schedule f/u within 30 days.  Thanks!

## 2015-10-13 NOTE — Telephone Encounter (Signed)
LVM informing pt of the below. ALSO advised pt to schedule a follow up with in 30 days.

## 2015-10-22 DIAGNOSIS — L739 Follicular disorder, unspecified: Secondary | ICD-10-CM | POA: Diagnosis not present

## 2015-10-22 DIAGNOSIS — L570 Actinic keratosis: Secondary | ICD-10-CM | POA: Diagnosis not present

## 2015-10-22 DIAGNOSIS — L565 Disseminated superficial actinic porokeratosis (DSAP): Secondary | ICD-10-CM | POA: Diagnosis not present

## 2015-10-22 DIAGNOSIS — L821 Other seborrheic keratosis: Secondary | ICD-10-CM | POA: Diagnosis not present

## 2015-10-22 DIAGNOSIS — L738 Other specified follicular disorders: Secondary | ICD-10-CM | POA: Diagnosis not present

## 2015-10-22 DIAGNOSIS — Z85828 Personal history of other malignant neoplasm of skin: Secondary | ICD-10-CM | POA: Diagnosis not present

## 2015-10-25 ENCOUNTER — Encounter: Payer: Self-pay | Admitting: Family

## 2015-10-25 ENCOUNTER — Ambulatory Visit (INDEPENDENT_AMBULATORY_CARE_PROVIDER_SITE_OTHER): Payer: BLUE CROSS/BLUE SHIELD | Admitting: Family

## 2015-10-25 VITALS — BP 148/88 | HR 74 | Temp 98.7°F | Resp 16 | Ht 71.5 in | Wt 193.2 lb

## 2015-10-25 DIAGNOSIS — F329 Major depressive disorder, single episode, unspecified: Secondary | ICD-10-CM

## 2015-10-25 DIAGNOSIS — I10 Essential (primary) hypertension: Secondary | ICD-10-CM

## 2015-10-25 DIAGNOSIS — Z1159 Encounter for screening for other viral diseases: Secondary | ICD-10-CM | POA: Diagnosis not present

## 2015-10-25 DIAGNOSIS — E785 Hyperlipidemia, unspecified: Secondary | ICD-10-CM | POA: Diagnosis not present

## 2015-10-25 DIAGNOSIS — G4733 Obstructive sleep apnea (adult) (pediatric): Secondary | ICD-10-CM | POA: Diagnosis not present

## 2015-10-25 DIAGNOSIS — F32A Depression, unspecified: Secondary | ICD-10-CM

## 2015-10-25 DIAGNOSIS — Z8669 Personal history of other diseases of the nervous system and sense organs: Secondary | ICD-10-CM

## 2015-10-25 MED ORDER — VENLAFAXINE HCL 37.5 MG PO TABS: ORAL_TABLET | ORAL | Status: DC

## 2015-10-25 NOTE — Progress Notes (Signed)
Subjective:    Patient ID: Francisco Lopez, male    DOB: Oct 16, 1960, 55 y.o.   MRN: [id number]  HPI  Francisco Lopez is a 55 yr old male who presents today for follow up.  1) HTN- maintained on Caduet. Reports better bp at home.    BP Readings from Last 3 Encounters:  10/25/15 148/88  06/30/15 156/91  06/18/15 138/82   2) OSA- no longer uses CPAP since most recent sleep study showed no need for CPAP 10/27/14.  Feels good during the day.    3) Depression-  Maintained on effexor.  Reports that mood is good. Has been on for years and does not think he still needs the medication.  Would like trial off of medication.   Review of Systems See HPI  Past Medical History  Diagnosis Date  . History of chicken pox   . Hypertension   . Hyperlipidemia   . Depression   . Basal cell carcinoma 2006  . Herpes simplex type 1 antibody positive 01/28/2015  . Herpes simplex type 2 infection 01/28/2015  . Diastolic dysfunction 123456    Per 2D echo 8/16     Social History   Social History  . Marital Status: Legally Separated    Spouse Name: N/A  . Number of Children: N/A  . Years of Education: N/A   Occupational History  . Not on file.   Social History Main Topics  . Smoking status: Never Smoker   . Smokeless tobacco: Never Used  . Alcohol Use: No  . Drug Use: No  . Sexual Activity: Not on file   Other Topics Concern  . 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 Knee surgery Left 2000    meninscus repair    Family History  Problem Relation Age of Onset  . Diabetes Mother     type II  . Hypertension Father     No Known Allergies  Current Outpatient Prescriptions on File Prior to Visit  Medication Sig Dispense Refill  . amlodipine-atorvastatin (CADUET) 10-20 MG tablet Take 1 tablet by mouth daily. 30  tablet 0  . tadalafil (CIALIS) 5 MG tablet Take 5 mg by mouth daily as needed.    . valACYclovir (VALTREX) 500 MG tablet Take 1 tablet (500 mg total) by mouth daily. 30 tablet 5  . venlafaxine (EFFEXOR) 75 MG tablet TAKE 1 TABLET BY MOUTH TWICE A DAY 60 tablet 0   No current facility-administered medications on file prior to visit.    BP 148/88 mmHg  Pulse 74  Temp(Src) 98.7 F (37.1 C) (Oral)  Resp 16  Ht 5' 11.5" (1.816 m)  Wt 193 lb 3.2 oz (87.635 kg)  BMI 26.57 kg/m2  SpO2 99%       Objective:   Physical Exam  Constitutional: He is oriented to person, place, and time. He appears well-developed and well-nourished. No distress.  HENT:  Head: Normocephalic and atraumatic.  Eyes: No scleral icterus.  Cardiovascular: Normal rate and regular rhythm.   No murmur heard. Pulmonary/Chest: Effort normal and breath sounds normal. No respiratory distress. He has no wheezes. He has no rales.  Musculoskeletal: He exhibits no edema.  Neurological: He is alert and oriented to person, place, and time.  Skin: Skin is warm  and dry.  Psychiatric: He has a normal mood and affect. His behavior is normal. Thought content normal.          Assessment & Plan:

## 2015-10-25 NOTE — Progress Notes (Signed)
Pre visit review using our clinic review tool, if applicable. No additional management support is needed unless otherwise documented below in the visit note. 

## 2015-10-25 NOTE — Patient Instructions (Addendum)
Please schedule lab draw at the front desk. Schedule complete physical at the front desk.   Taper effexor dose per instructions. Call if you develop side effects/mood problems with taper.

## 2015-10-27 ENCOUNTER — Other Ambulatory Visit: Payer: Self-pay | Admitting: Family

## 2015-10-29 DIAGNOSIS — Z8669 Personal history of other diseases of the nervous system and sense organs: Secondary | ICD-10-CM

## 2015-10-29 HISTORY — DX: Personal history of other diseases of the nervous system and sense organs: Z86.69

## 2015-10-29 NOTE — Assessment & Plan Note (Signed)
Stable. Would like trial off of effexor. Advised pt on slow taper and follow up as outlined in AVS.

## 2015-10-29 NOTE — Assessment & Plan Note (Signed)
5/16 sleep study neg for sleep apnea.

## 2015-10-29 NOTE — Assessment & Plan Note (Signed)
BP stable on current medications.  Continue same. Obtain bmet.

## 2015-11-07 ENCOUNTER — Other Ambulatory Visit: Payer: Self-pay | Admitting: Family

## 2015-11-12 ENCOUNTER — Other Ambulatory Visit: Payer: Self-pay

## 2015-11-14 NOTE — Telephone Encounter (Signed)
Which medication ?

## 2015-11-15 ENCOUNTER — Other Ambulatory Visit: Payer: Self-pay

## 2015-11-16 DIAGNOSIS — H1013 Acute atopic conjunctivitis, bilateral: Secondary | ICD-10-CM | POA: Diagnosis not present

## 2015-11-16 DIAGNOSIS — H40033 Anatomical narrow angle, bilateral: Secondary | ICD-10-CM | POA: Diagnosis not present

## 2015-11-16 MED ORDER — VENLAFAXINE HCL 37.5 MG PO TABS: 37.5000 mg | ORAL_TABLET | Freq: Two times a day (BID) | ORAL | Status: DC

## 2015-11-16 NOTE — Telephone Encounter (Signed)
Effexor

## 2015-12-04 ENCOUNTER — Other Ambulatory Visit: Payer: Self-pay | Admitting: Family

## 2015-12-06 NOTE — Telephone Encounter (Signed)
He is supposed to be tapered off of the effexor, (see refill request) is he still taking? How is the taper going?

## 2015-12-07 NOTE — Telephone Encounter (Signed)
Left detailed message on cell# and call me back with response to below questions.

## 2015-12-07 NOTE — Telephone Encounter (Signed)
Spoke with pt. He states he just completed the 1 tablet twice a day for 2 weeks and needs quantity to complete the taper. Gave verbal to pharmacist, Threasa Beards for #10 tablets, Take 1 tablet daily for 1 week, then 1 tablet every other day for 1 week then stop. Pharmacy will discard previous rx for #15 (1 tablet twice a day with meal).  Notified pt.

## 2015-12-20 ENCOUNTER — Other Ambulatory Visit: Payer: Self-pay | Admitting: Family

## 2015-12-20 NOTE — Telephone Encounter (Signed)
Last filled: 02/24/15 Amt: 30, 5 Last OV:  10/25/15  Med filled.

## 2015-12-27 ENCOUNTER — Ambulatory Visit (INDEPENDENT_AMBULATORY_CARE_PROVIDER_SITE_OTHER): Payer: BLUE CROSS/BLUE SHIELD | Admitting: Family

## 2015-12-27 ENCOUNTER — Encounter: Payer: Self-pay | Admitting: Family

## 2015-12-27 VITALS — BP 122/86 | HR 78 | Temp 98.1°F | Resp 18 | Ht 72.0 in | Wt 197.0 lb

## 2015-12-27 DIAGNOSIS — E785 Hyperlipidemia, unspecified: Secondary | ICD-10-CM | POA: Diagnosis not present

## 2015-12-27 DIAGNOSIS — F32A Depression, unspecified: Secondary | ICD-10-CM

## 2015-12-27 DIAGNOSIS — Z1159 Encounter for screening for other viral diseases: Secondary | ICD-10-CM

## 2015-12-27 DIAGNOSIS — K219 Gastro-esophageal reflux disease without esophagitis: Secondary | ICD-10-CM | POA: Diagnosis not present

## 2015-12-27 DIAGNOSIS — I10 Essential (primary) hypertension: Secondary | ICD-10-CM

## 2015-12-27 DIAGNOSIS — F329 Major depressive disorder, single episode, unspecified: Secondary | ICD-10-CM

## 2015-12-27 HISTORY — DX: Gastro-esophageal reflux disease without esophagitis: K21.9

## 2015-12-27 LAB — HEPATITIS C ANTIBODY: HCV AB: NEGATIVE

## 2015-12-27 MED ORDER — OMEPRAZOLE 40 MG PO CPDR: 40.0000 mg | DELAYED_RELEASE_CAPSULE | Freq: Every day | ORAL | Status: DC

## 2015-12-27 NOTE — Assessment & Plan Note (Signed)
I suspect this is cause for cough. Trial of omeprazole. Counseled pt on GERD diet.

## 2015-12-27 NOTE — Progress Notes (Signed)
Subjective:    Patient ID: Francisco Lopez, male    DOB: 08/15/1960, 55 y.o.   MRN: [id number]  HPI  Francisco Lopez is a 55 yr old male who presents today for follow up.  1) Depression- pt weaned off of effexor. Reports that he is more irritable, but does not wish to restart the medication.  Last dose was 1 week ago.    2) HTN- maintained on caduet.  BP Readings from Last 3 Encounters:  12/27/15 122/86  10/25/15 148/88  06/30/15 156/91   3) Cough- reports intermittent coughing spells x 1.5 weeks.  He denies drainage.  Cough is dry. Does not feel sick.  He has had some heart burn, has used nexium prn.     Review of Systems    see HPI  Past Medical History  Diagnosis Date  . History of chicken pox   . Hypertension   . Hyperlipidemia   . Depression   . Basal cell carcinoma 2006  . Herpes simplex type 1 antibody positive 01/28/2015  . Herpes simplex type 2 infection 01/28/2015  . Diastolic dysfunction 123456    Per 2D echo 8/16     Social History   Social History  . Marital Status: Legally Separated    Spouse Name: N/A  . Number of Children: N/A  . Years of Education: N/A   Occupational History  . Not on file.   Social History Main Topics  . Smoking status: Never Smoker   . Smokeless tobacco: Never Used  . Alcohol Use: No  . Drug Use: No  . Sexual Activity: Not on file   Other Topics Concern  . 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 Knee surgery Left 2000    meninscus repair    Family History  Problem Relation Age of Onset  . Diabetes Mother     type II  . Hypertension Father     No Known Allergies  Current Outpatient Prescriptions on File Prior to Visit  Medication Sig Dispense Refill  . amlodipine-atorvastatin (CADUET) 10-20 MG tablet Take 1 tablet by mouth daily. 30  tablet 1  . tadalafil (CIALIS) 5 MG tablet Take 5 mg by mouth daily as needed.    . valACYclovir (VALTREX) 500 MG tablet Take 1 tablet (500 mg total) by mouth daily. 30 tablet 5  . valACYclovir (VALTREX) 500 MG tablet TAKE 1 TABLET TWICE A DAY 30 tablet 2   No current facility-administered medications on file prior to visit.    BP 122/86 mmHg  Pulse 78  Temp(Src) 98.1 F (36.7 C) (Oral)  Resp 18  Ht 6' (1.829 m)  Wt 197 lb (89.359 kg)  BMI 26.71 kg/m2  SpO2 97%    Objective:   Physical Exam  Constitutional: He is oriented to person, place, and time. He appears well-developed and well-nourished. No distress.  HENT:  Head: Normocephalic and atraumatic.  Cardiovascular: Normal rate and regular rhythm.   No murmur heard. Pulmonary/Chest: Effort normal and breath sounds normal. No respiratory distress. He has no wheezes. He has no rales.  Musculoskeletal: He exhibits no edema.  Neurological: He is alert and oriented to person, place, and time.  Skin: Skin is warm and dry.  Psychiatric: He has a normal mood and affect.  His behavior is normal. Thought content normal.          Assessment & Plan:

## 2015-12-27 NOTE — Patient Instructions (Signed)
Please complete lab work prior to leaving. Begin omeprazole (antacid) for reflux and to help your cough.

## 2015-12-27 NOTE — Addendum Note (Signed)
Addended by: Caffie Pinto on: 12/27/2015 04:51 PM   Modules accepted: Orders

## 2015-12-27 NOTE — Progress Notes (Signed)
Pre visit review using our clinic review tool, if applicable. No additional management support is needed unless otherwise documented below in the visit note. 

## 2015-12-27 NOTE — Assessment & Plan Note (Signed)
BP stable on caduet, continue same, obtain bmet

## 2015-12-27 NOTE — Assessment & Plan Note (Signed)
Mild irritability. He wishes to remain off of medication. Advised pt that hopefully his irritability will improve in the next few weeks.

## 2015-12-28 LAB — BASIC METABOLIC PANEL
BUN: 18 mg/dL (ref 6–23)
CALCIUM: 9.8 mg/dL (ref 8.4–10.5)
CO2: 29 mEq/L (ref 19–32)
Chloride: 104 mEq/L (ref 96–112)
Creatinine, Ser: 0.98 mg/dL (ref 0.40–1.50)
GFR: 84.39 mL/min (ref >60.00)
Glucose, Bld: 109 mg/dL — ABNORMAL HIGH (ref 70–99)
Potassium: 4.2 mEq/L (ref 3.5–5.1)
SODIUM: 140 meq/L (ref 135–145)

## 2015-12-28 LAB — LIPID PANEL
Cholesterol: 156 mg/dL (ref 0–200)
HDL: 35.9 mg/dL — AB (ref >39.00)
NonHDL: 119.95
TRIGLYCERIDES: 363 mg/dL — AB (ref 0.0–149.0)
Total CHOL/HDL Ratio: 4
VLDL: 72.6 mg/dL — ABNORMAL HIGH (ref 0.0–40.0)

## 2015-12-28 LAB — LDL CHOLESTEROL, DIRECT: Direct LDL: 97 mg/dL

## 2015-12-29 ENCOUNTER — Telehealth: Payer: Self-pay | Admitting: Family

## 2015-12-29 DIAGNOSIS — R739 Hyperglycemia, unspecified: Secondary | ICD-10-CM

## 2015-12-29 DIAGNOSIS — E785 Hyperlipidemia, unspecified: Secondary | ICD-10-CM

## 2015-12-29 MED ORDER — FENOFIBRATE 145 MG PO TABS: 145.0000 mg | ORAL_TABLET | Freq: Every day | ORAL | Status: DC

## 2015-12-29 NOTE — Telephone Encounter (Signed)
Please let pt know that hep c testing is negative. Sugar is mildly elevated (could you please ask lab to add on A1C- dx hyperglycemia?) Your triglycerides are mildly elevated. Please work on avoiding concentrated sweets, and limiting white carbs (rice/bread/pasta/potatoes). Instead substitute whole grain versions with reasonable portions. Also add fenofibrate once daily. Repeat FLP in 3 months.

## 2015-12-30 NOTE — Telephone Encounter (Signed)
Melissa- hgb a1c cannot be added. Do I need to bring him in soon for lab appt?

## 2015-12-30 NOTE — Telephone Encounter (Signed)
Yes please

## 2015-12-30 NOTE — Telephone Encounter (Signed)
Notified pt and he voices understanding. He scheduled lab appt for 01/05/16 at 1:30pm for hgb a1c. Pt states he will schedule 1 month f/u with PCP and 3 month lab appt after next week when he will know his schedule further. Future lab orders entered.

## 2015-12-30 NOTE — Telephone Encounter (Signed)
Left message for pt to return my call.

## 2015-12-30 NOTE — Telephone Encounter (Signed)
Patient returning your call best [phone]

## 2016-01-05 ENCOUNTER — Telehealth: Payer: Self-pay | Admitting: Family

## 2016-01-05 ENCOUNTER — Other Ambulatory Visit: Payer: Self-pay | Admitting: Family

## 2016-01-05 ENCOUNTER — Other Ambulatory Visit (INDEPENDENT_AMBULATORY_CARE_PROVIDER_SITE_OTHER): Payer: BLUE CROSS/BLUE SHIELD

## 2016-01-05 DIAGNOSIS — R739 Hyperglycemia, unspecified: Secondary | ICD-10-CM

## 2016-01-05 LAB — HEMOGLOBIN A1C: Hgb A1c MFr Bld: 5.5 % (ref 4.6–6.5)

## 2016-01-05 NOTE — Telephone Encounter (Signed)
Please see below BP readings and advise:  11/05/15 8am  126/73     5pm    134 70  8pm 127/79  11/06/15  7:30pm 124/73  HR 93  11/07/15  9am  113/73  HR 67  11/08/15  8:15pm 119/74  HR 73  11/09/15  3am  134/82  HR 74    5pm  136/83  HR 65  11/10/15  2:30am 139/91  HR 72   7:30pm 121/77  HR 78  11/11/15  2:30am 128/84  HR 69  5pm  136/77  HR 73  11/12/15  8pm  125/76  HR 74  11/13/15  8pm  134/74  HR 69  11/14/15     133/80    11/15/15    130/76  HR 60

## 2016-01-05 NOTE — Telephone Encounter (Signed)
BP and HR look good. Continue current medication.

## 2016-01-05 NOTE — Telephone Encounter (Signed)
Pt dropped off two small sheets with BP information for PCP to see.

## 2016-01-06 NOTE — Telephone Encounter (Signed)
Called pt, no answer. Sent MyChart message to pt and left message for patient to check MyChart for PCP feedback.

## 2016-02-07 ENCOUNTER — Ambulatory Visit (INDEPENDENT_AMBULATORY_CARE_PROVIDER_SITE_OTHER): Payer: BLUE CROSS/BLUE SHIELD | Admitting: Family

## 2016-02-07 ENCOUNTER — Encounter: Payer: Self-pay | Admitting: Family

## 2016-02-07 DIAGNOSIS — G5603 Carpal tunnel syndrome, bilateral upper limbs: Secondary | ICD-10-CM

## 2016-02-07 DIAGNOSIS — E785 Hyperlipidemia, unspecified: Secondary | ICD-10-CM | POA: Diagnosis not present

## 2016-02-07 DIAGNOSIS — G5600 Carpal tunnel syndrome, unspecified upper limb: Secondary | ICD-10-CM

## 2016-02-07 HISTORY — DX: Carpal tunnel syndrome, unspecified upper limb: G56.00

## 2016-02-07 MED ORDER — MELOXICAM 7.5 MG PO TABS: 7.5000 mg | ORAL_TABLET | Freq: Every day | ORAL | 0 refills | Status: DC

## 2016-02-07 NOTE — Assessment & Plan Note (Signed)
Advised pt on use of meloxicam and wrist splints hs and as able during the day.

## 2016-02-07 NOTE — Assessment & Plan Note (Signed)
BP mildly elevated however home readings are much better.  Continue caduet.

## 2016-02-07 NOTE — Progress Notes (Signed)
Pre visit review using our clinic review tool, if applicable. No additional management support is needed unless otherwise documented below in the visit note. 

## 2016-02-07 NOTE — Patient Instructions (Addendum)
Please complete purchase over the counter wrist braces to wear at night and as able while at home.  Begin meloxicam once daily for carpal tunnel syndrome in your arms/hands. Let me know if your symptoms worsen or if they do not improve.  Please return fasting for follow up triglyceride level.

## 2016-02-07 NOTE — Progress Notes (Signed)
Subjective:    Patient ID: Francisco Lopez, male    DOB: Dec 03, 1960, 55 y.o.   MRN: [id number]  HPI  Francisco Lopez is a 55 yr old male who presents today for follow up.  Cough- last visit we added trial of omeprazole for possible GERD related cough. Reports near resolution of cough- much better.   Hypertriglyceridemia- fenofibrate was began 1 month ago.  Lab Results  Component Value Date   CHOL 156 12/27/2015   HDL 35.90 (L) 12/27/2015   LDLDIRECT 97.0 12/27/2015   TRIG 363.0 (H) 12/27/2015   CHOLHDL 4 12/27/2015    Notes that he has some forearm aching, and tingling.  Only occurs after 10-15 minutes of walking.  Forearms get "cold to the touch." + Wrist soreness. 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     Review of Systems    see HPI  Past Medical History:  Diagnosis Date  . Basal cell carcinoma 2006  . Depression   . Diastolic dysfunction 123456   Per 2D echo 8/16  . Herpes simplex type 1 antibody positive 01/28/2015  . Herpes simplex type 2 infection 01/28/2015  . History of chicken pox   . Hyperlipidemia   . Hypertension      Social History   Social History  . Marital status: Legally Separated    Spouse name: N/A  . Number of children: N/A  . Years of education: N/A   Occupational History  . Not on file.   Social History Main Topics  . Smoking status: Never Smoker  . Smokeless tobacco: Never Used  . Alcohol use No  . Drug use: No  . Sexual activity: Not on file   Other Topics Concern  . 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 KNEE SURGERY Left 2000   meninscus repair    Family History  Problem Relation Age of Onset  . Diabetes Mother     type II  . Hypertension Father     No Known Allergies  Current Outpatient Prescriptions on File Prior to Visit  Medication Sig  Dispense Refill  . amlodipine-atorvastatin (CADUET) 10-20 MG tablet TAKE 1 TABLET BY MOUTH EVERY DAY 30 tablet 2  . fenofibrate (TRICOR) 145 MG tablet Take 1 tablet (145 mg total) by mouth daily. 30 tablet 2  . omeprazole (PRILOSEC) 40 MG capsule Take 1 capsule (40 mg total) by mouth daily. 30 capsule 3  . tadalafil (CIALIS) 5 MG tablet Take 5 mg by mouth daily as needed.    . valACYclovir (VALTREX) 500 MG tablet Take 1 tablet (500 mg total) by mouth daily. 30 tablet 5   No current facility-administered medications on file prior to visit.     BP (!) 145/75 (BP Location: Right Arm, Patient Position: Sitting, Cuff Size: Normal)   Pulse 71   Temp 98.7 F (37.1 C) (Oral)   Resp 16   Ht 6\' 2"  (1.88 m)   Wt 198 lb 6.4 oz (90 kg)   SpO2 98%   BMI 25.47 kg/m    Objective:   Physical Exam  Constitutional: He is oriented to person, place, and time. He appears well-developed and well-nourished. No distress.  HENT:  Head: Normocephalic and atraumatic.  Cardiovascular: Normal rate and regular rhythm.   No  murmur heard. Pulses:      Radial pulses are 2+ on the right side, and 2+ on the left side.  Pulmonary/Chest: Effort normal and breath sounds normal. No respiratory distress. He has no wheezes. He has no rales.  Musculoskeletal: He exhibits no edema.  Neurological: He is alert and oriented to person, place, and time.  + phalans bilaterally  Skin: Skin is warm and dry.  Psychiatric: He has a normal mood and affect. His behavior is normal. Thought content normal.          Assessment & Plan:

## 2016-02-07 NOTE — Assessment & Plan Note (Signed)
Asked pt to return fasting for lipid panel to re-assess trigs.

## 2016-03-18 ENCOUNTER — Other Ambulatory Visit: Payer: Self-pay | Admitting: Family

## 2016-04-15 ENCOUNTER — Other Ambulatory Visit: Payer: Self-pay | Admitting: Family

## 2016-04-16 ENCOUNTER — Other Ambulatory Visit: Payer: Self-pay | Admitting: Family

## 2016-04-17 MED ORDER — VALACYCLOVIR HCL 500 MG PO TABS: 500.0000 mg | ORAL_TABLET | Freq: Every day | ORAL | 5 refills | Status: DC

## 2016-04-17 NOTE — Telephone Encounter (Signed)
Received refill request of valtrex 500mg  twice a day. Current med list is for 1 tablet by mouth daily. Request denied and refill sent for once daily dosing.

## 2016-04-17 NOTE — Telephone Encounter (Signed)
Medication filled to pharmacy as requested.   

## 2016-04-21 ENCOUNTER — Other Ambulatory Visit: Payer: Self-pay | Admitting: Family

## 2016-05-23 ENCOUNTER — Other Ambulatory Visit: Payer: Self-pay | Admitting: Family

## 2016-05-31 ENCOUNTER — Ambulatory Visit (INDEPENDENT_AMBULATORY_CARE_PROVIDER_SITE_OTHER): Payer: BLUE CROSS/BLUE SHIELD | Admitting: Medical

## 2016-05-31 ENCOUNTER — Encounter: Payer: Self-pay | Admitting: Medical

## 2016-05-31 VITALS — BP 144/92 | HR 58 | Temp 97.8°F | Resp 16 | Ht 74.0 in | Wt 195.5 lb

## 2016-05-31 DIAGNOSIS — Z23 Encounter for immunization: Secondary | ICD-10-CM | POA: Diagnosis not present

## 2016-05-31 DIAGNOSIS — R040 Epistaxis: Secondary | ICD-10-CM | POA: Diagnosis not present

## 2016-05-31 DIAGNOSIS — J0100 Acute maxillary sinusitis, unspecified: Secondary | ICD-10-CM

## 2016-05-31 MED ORDER — BENZONATATE 100 MG PO CAPS: 100.0000 mg | ORAL_CAPSULE | Freq: Three times a day (TID) | ORAL | 0 refills | Status: DC | PRN

## 2016-05-31 MED ORDER — DOXYCYCLINE HYCLATE 100 MG PO TABS: 100.0000 mg | ORAL_TABLET | Freq: Two times a day (BID) | ORAL | 0 refills | Status: DC

## 2016-05-31 MED ORDER — FLUTICASONE PROPIONATE 50 MCG/ACT NA SUSP: 2.0000 | Freq: Every day | NASAL | 1 refills | Status: DC

## 2016-05-31 NOTE — Progress Notes (Signed)
Pre visit review using our clinic review tool, if applicable. No additional management support is needed unless otherwise documented below in the visit note/SLS  

## 2016-05-31 NOTE — Patient Instructions (Addendum)
You appear to have a sinus infection. I am prescribing doxycycline  antibiotic for the infection. To help with the nasal congestion I prescribed flonase nasal steroid. For any associated cough, I prescribed cough medicine.  Rest, hydrate, tylenol for fever.  I do want you to follow up to make sure  You are completely better and to check inside of nose once inflammation and infection resolved due to report of intermittent bleeding.  Follow up in 10-14  days or as needed.

## 2016-05-31 NOTE — Progress Notes (Signed)
Subjective:    Patient ID: Francisco Lopez, male    DOB: Mar 10, 1961, 55 y.o.   MRN: [id number]  HPI   Pt in with one month or so of nasal and chest congestion. He blows his nose and will get colored mucous with blood tinge to nose. Some intermittent nose bleed. Pt has used otc nasal spray. But not more than 2 days.   No fever, no chills, or sweats.  He get rare sinus infection.  Not chest congested.   Pt states entire month sinus pressure has been daily.  Review of Systems  Constitutional: Negative for chills, fatigue and fever.  HENT: Positive for congestion, nosebleeds, postnasal drip, sinus pain and sinus pressure. Negative for sore throat.        Rare nose bleed. Mostly blood tinged mucous.  Respiratory: Negative for cough, chest tightness, shortness of breath and wheezing.   Cardiovascular: Negative for chest pain and palpitations.  Musculoskeletal: Negative for back pain.  Skin: Negative for rash.  Neurological: Negative for dizziness, seizures, syncope, facial asymmetry, weakness and light-headedness.  Hematological: Negative for adenopathy. Does not bruise/bleed easily.  Psychiatric/Behavioral: Negative for behavioral problems and confusion. The patient is not nervous/anxious.     Past Medical History:  Diagnosis Date  . Basal cell carcinoma 2006  . Depression   . Diastolic dysfunction 123456   Per 2D echo 8/16  . Herpes simplex type 1 antibody positive 01/28/2015  . Herpes simplex type 2 infection 01/28/2015  . History of chicken pox   . Hyperlipidemia   . Hypertension      Social History   Social History  . Marital status: Legally Separated    Spouse name: N/A  . Number of children: N/A  . Years of education: N/A   Occupational History  . Not on file.   Social History Main Topics  . Smoking status: Never Smoker  . Smokeless tobacco: Never Used  . Alcohol use No  . Drug use: No  . Sexual activity: Not on file   Other Topics Concern  . 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 KNEE SURGERY Left 2000   meninscus repair    Family History  Problem Relation Age of Onset  . Diabetes Mother     type II  . Hypertension Father     No Known Allergies  Current Outpatient Prescriptions on File Prior to Visit  Medication Sig Dispense Refill  . amlodipine-atorvastatin (CADUET) 10-20 MG tablet TAKE 1 TABLET BY MOUTH EVERY DAY 90 tablet 0  . fenofibrate (TRICOR) 145 MG tablet TAKE 1 TABLET (145 MG TOTAL) BY MOUTH DAILY. 30 tablet 2  . meloxicam (MOBIC) 7.5 MG tablet Take 1 tablet (7.5 mg total) by mouth daily. 14 tablet 0  . omeprazole (PRILOSEC) 40 MG capsule TAKE 1 CAPSULE (40 MG TOTAL) BY MOUTH DAILY. 30 capsule 3  . tadalafil (CIALIS) 5 MG tablet Take 5 mg by mouth daily as needed.    . valACYclovir (VALTREX) 500 MG tablet Take 1 tablet (500 mg total) by mouth daily. 30 tablet 5   No current facility-administered medications on file prior to visit.     BP (!) 144/92 (BP Location: Left Arm, Patient Position: Sitting, Cuff Size: Large)   Pulse (!) 58  Temp 97.8 F (36.6 C) (Oral)   Resp 16   Ht 6\' 2"  (1.88 m)   Wt 195 lb 8 oz (88.7 kg)   SpO2 98%   BMI 25.10 kg/m       Objective:   Physical Exam   General  Mental Status - Alert. General Appearance - Well groomed. Not in acute distress.  Skin Rashes- No Rashes.  HEENT Head- Normal. Ear Auditory Canal - Left- Normal. Right - Normal.Tympanic Membrane- Left- Normal. Right- Normal. Eye Sclera/Conjunctiva- Left- Normal. Right- Normal. Nose & Sinuses Nasal Mucosa- Left-  Boggy and Congested. Right-  Boggy and  Congested.(no bleeding/blood seen)Bilateral maxillary/ethmoid pressure  But no  frontal sinus pressure. Mouth & Throat Lips: Upper Lip- Normal: no dryness, cracking, pallor, cyanosis,  or vesicular eruption. Lower Lip-Normal: no dryness, cracking, pallor, cyanosis or vesicular eruption. Buccal Mucosa- Bilateral- No Aphthous ulcers. Oropharynx- No Discharge or Erythema. Tonsils: Characteristics- Bilateral- No Erythema or Congestion. Size/Enlargement- Bilateral- No enlargement. Discharge- bilateral-None.  Neck Neck- Supple. No Masses.   Chest and Lung Exam Auscultation: Breath Sounds:-Clear even and unlabored.  Cardiovascular Auscultation:Rythm- Regular, rate and rhythm. Murmurs & Other Heart Sounds:Ausculatation of the heart reveal- No Murmurs.  Lymphatic Head & Neck General Head & Neck Lymphatics: Bilateral: Description- No Localized lymphadenopathy.       Assessment & Plan:  You appear to have a sinus infection. I am prescribing doxycycline  antibiotic for the infection. To help with the nasal congestion I prescribed flonase nasal steroid. For any associated cough, I prescribed cough medicine.  Rest, hydrate, tylenol for fever.   I do want you to follow up to make sure  You are completely better and to check inside of nose once inflammation and infection resolved due to report of intermittent bleeding.  Follow up in 10-14 days or as needed.

## 2016-06-12 DIAGNOSIS — M7500 Adhesive capsulitis of unspecified shoulder: Secondary | ICD-10-CM

## 2016-06-12 HISTORY — DX: Adhesive capsulitis of unspecified shoulder: M75.00

## 2016-06-18 ENCOUNTER — Other Ambulatory Visit: Payer: Self-pay | Admitting: Family

## 2016-06-22 ENCOUNTER — Encounter: Payer: Self-pay | Admitting: Medical

## 2016-06-22 ENCOUNTER — Ambulatory Visit (INDEPENDENT_AMBULATORY_CARE_PROVIDER_SITE_OTHER): Payer: BLUE CROSS/BLUE SHIELD | Admitting: Medical

## 2016-06-22 VITALS — BP 132/80 | HR 82 | Temp 98.8°F | Resp 14 | Ht 74.0 in | Wt 194.1 lb

## 2016-06-22 DIAGNOSIS — R0981 Nasal congestion: Secondary | ICD-10-CM | POA: Diagnosis not present

## 2016-06-22 DIAGNOSIS — J3489 Other specified disorders of nose and nasal sinuses: Secondary | ICD-10-CM

## 2016-06-22 DIAGNOSIS — J0101 Acute recurrent maxillary sinusitis: Secondary | ICD-10-CM

## 2016-06-22 MED ORDER — AMOXICILLIN-POT CLAVULANATE 875-125 MG PO TABS: 1.0000 | ORAL_TABLET | Freq: Two times a day (BID) | ORAL | 0 refills | Status: DC

## 2016-06-22 NOTE — Progress Notes (Signed)
Pre visit review using our clinic review tool, if applicable. No additional management support is needed unless otherwise documented below in the visit note. 

## 2016-06-22 NOTE — Progress Notes (Signed)
Subjective:    Patient ID: Francisco Lopez, male    DOB: 1960/12/14, 56 y.o.   MRN: [id number]  HPI  Pt in states his sinus pressure and nasal congestion got better(responded well to antibiotic). But about 4-5 days ago got runny nose. Pt rt sinus is little tender on rt maxillary and some mild tenderness. Pt states with extreme temperature change he seemed to have got nasal congested. Some mucous when blows nose  Pt when he blows his nose will see some watery bloody drainage and he can feel small scab inside his nose rt side nares.  The scab area in side rt nostril has been present now for 6 weeks and has not resolved with treatment. Still tender and seems to bleed some and drain as he describes serosanguinous drainage when he picks at area  Review of Systems  Constitutional: Negative for chills, fatigue and fever.  HENT: Positive for congestion, sinus pain and sinus pressure. Negative for mouth sores, nosebleeds, postnasal drip, rhinorrhea and sore throat.   Cardiovascular: Negative for chest pain and palpitations.  Gastrointestinal: Negative for abdominal pain.  Musculoskeletal: Negative for back pain.  Skin:       Rt narea nasal lesion.  Neurological: Negative for dizziness and headaches.  Hematological: Negative for adenopathy. Does not bruise/bleed easily.  Psychiatric/Behavioral: Negative for behavioral problems and confusion.    Past Medical History:  Diagnosis Date  . Basal cell carcinoma 2006  . Depression   . Diastolic dysfunction 123456   Per 2D echo 8/16  . Herpes simplex type 1 antibody positive 01/28/2015  . Herpes simplex type 2 infection 01/28/2015  . History of chicken pox   . Hyperlipidemia   . Hypertension      Social History   Social History  . Marital status: Legally Separated    Spouse name: N/A  . Number of children: N/A  . Years of education: N/A   Occupational History  . Not on file.   Social History Main Topics  . Smoking status: Never Smoker    . Smokeless tobacco: Never Used  . Alcohol use No  . Drug use: No  . Sexual activity: Not on file   Other Topics Concern  . 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 KNEE SURGERY Left 2000   meninscus repair    Family History  Problem Relation Age of Onset  . Diabetes Mother     type II  . Hypertension Father     No Known Allergies  Current Outpatient Prescriptions on File Prior to Visit  Medication Sig Dispense Refill  . amlodipine-atorvastatin (CADUET) 10-20 MG tablet TAKE 1 TABLET BY MOUTH EVERY DAY 90 tablet 0  . Calcium Carb-Cholecalciferol (CALCIUM-VITAMIN D) 500-200 MG-UNIT tablet Take 1 tablet by mouth daily.    . fenofibrate (TRICOR) 145 MG tablet TAKE 1 TABLET (145 MG TOTAL) BY MOUTH DAILY. 30 tablet 5  . fluticasone (FLONASE) 50 MCG/ACT nasal spray Place 2 sprays into both nostrils daily. 16 g 1  . Krill Oil 1000 MG CAPS Take by mouth.    . meloxicam (MOBIC) 7.5 MG tablet Take 1 tablet (7.5 mg total) by mouth daily. 14 tablet 0  . Multiple Vitamins-Minerals (MENS MULTIVITAMIN PLUS PO) Take by mouth.    Marland Kitchen  omeprazole (PRILOSEC) 40 MG capsule TAKE 1 CAPSULE (40 MG TOTAL) BY MOUTH DAILY. 30 capsule 3  . tadalafil (CIALIS) 5 MG tablet Take 5 mg by mouth daily as needed.    . valACYclovir (VALTREX) 500 MG tablet Take 1 tablet (500 mg total) by mouth daily. 30 tablet 5  . vitamin B-12 (CYANOCOBALAMIN) 100 MCG tablet Take 100 mcg by mouth daily.    . vitamin E 400 UNIT capsule Take 400 Units by mouth daily.    Marland Kitchen zinc gluconate 50 MG tablet Take 50 mg by mouth daily.    . benzonatate (TESSALON) 100 MG capsule Take 1 capsule (100 mg total) by mouth 3 (three) times daily as needed for cough. (Patient not taking: Reported on 06/22/2016) 21 capsule 0  . doxycycline (VIBRA-TABS) 100 MG tablet Take 1  tablet (100 mg total) by mouth 2 (two) times daily. Can give caps or generic. (Patient not taking: Reported on 06/22/2016) 20 tablet 0   No current facility-administered medications on file prior to visit.     BP 132/80 (BP Location: Right Arm, Patient Position: Sitting, Cuff Size: Small)   Pulse 82   Temp 98.8 F (37.1 C) (Oral)   Resp 14   Ht 6\' 2"  (1.88 m)   Wt 194 lb 2 oz (88.1 kg)   SpO2 97%   BMI 24.92 kg/m       Objective:   Physical Exam  General  Mental Status - Alert. General Appearance - Well groomed. Not in acute distress.nasal congested  Skin Rashes- No Rashes.  HEENT Head- Normal. Ear Auditory Canal - Left- Normal. Right - Normal.Tympanic Membrane- Left- Normal. Right- Normal. Eye Sclera/Conjunctiva- Left- Normal. Right- Normal. Nose & Sinuses Nasal Mucosa- Left-  Boggy and Congested. Right-  Boggy and  Congested.rt side maxillary sinus pressure but no frontal sinus pressure. On inspetion high up inside rt nostril some mucous seen but could not directly visualize scab lesion. Mouth & Throat Lips: Upper Lip- Normal: no dryness, cracking, pallor, cyanosis, or vesicular eruption. Lower Lip-Normal: no dryness, cracking, pallor, cyanosis or vesicular eruption. Buccal Mucosa- Bilateral- No Aphthous ulcers. Oropharynx- No Discharge or Erythema. Tonsils: Characteristics- Bilateral- No Erythema or Congestion. Size/Enlargement- Bilateral- No enlargement. Discharge- bilateral-None.  Neck Neck- Supple. No Masses.   Chest and Lung Exam Auscultation: Breath Sounds:-Clear even and unlabored.  Cardiovascular Auscultation:Rythm- Regular, rate and rhythm. Murmurs & Other Heart Sounds:Ausculatation of the heart reveal- No Murmurs.  Lymphatic Head & Neck General Head & Neck Lymphatics: Bilateral: Description- No Localized lymphadenopathy.       Assessment & Plan:  For probable early recurrent sinus infection rx augmentin.  For nasal congestion can use saline  nasal sprays. Avoid steroid nasal sprays presently in light of your nasal lesion and occasional bleeding.  Not to pick inside nare or rub nose.  Will refer to ENT to evaluate area in about 2-3 weeks. You need directly visualization of the area and maybe study of tissue.  Follow up with [place] in 3-4 weeks or sooner if needed prior to ENT visit.  Allyiah Gartner, Percell Miller, PA-C

## 2016-06-22 NOTE — Patient Instructions (Addendum)
For probable early recurrent sinus infection rx augmentin.  For nasal congestion can use saline nasal sprays. Avoid steroid nasal sprays presently in light of your nasal lesion and occasional bleeding.  Not to pick inside nare or rub nose.  Will refer to ENT to evaluate area in about 2-3 weeks. You need directly visualization of the area and maybe study of tissue.  Follow up with [place] in 3-4 weeks or sooner if needed prior to ENT visit.

## 2016-06-23 DIAGNOSIS — F4323 Adjustment disorder with mixed anxiety and depressed mood: Secondary | ICD-10-CM | POA: Diagnosis not present

## 2016-07-13 DIAGNOSIS — N400 Enlarged prostate without lower urinary tract symptoms: Secondary | ICD-10-CM | POA: Diagnosis not present

## 2016-07-14 DIAGNOSIS — F4323 Adjustment disorder with mixed anxiety and depressed mood: Secondary | ICD-10-CM | POA: Diagnosis not present

## 2016-07-21 ENCOUNTER — Other Ambulatory Visit: Payer: Self-pay | Admitting: Family

## 2016-07-24 DIAGNOSIS — K219 Gastro-esophageal reflux disease without esophagitis: Secondary | ICD-10-CM | POA: Diagnosis not present

## 2016-07-24 DIAGNOSIS — N401 Enlarged prostate with lower urinary tract symptoms: Secondary | ICD-10-CM | POA: Diagnosis not present

## 2016-07-24 DIAGNOSIS — J310 Chronic rhinitis: Secondary | ICD-10-CM

## 2016-07-24 DIAGNOSIS — Z6828 Body mass index (BMI) 28.0-28.9, adult: Secondary | ICD-10-CM | POA: Diagnosis not present

## 2016-07-24 DIAGNOSIS — N138 Other obstructive and reflux uropathy: Secondary | ICD-10-CM | POA: Diagnosis not present

## 2016-07-24 DIAGNOSIS — N529 Male erectile dysfunction, unspecified: Secondary | ICD-10-CM | POA: Diagnosis not present

## 2016-07-24 DIAGNOSIS — J3489 Other specified disorders of nose and nasal sinuses: Secondary | ICD-10-CM | POA: Diagnosis not present

## 2016-07-24 DIAGNOSIS — J343 Hypertrophy of nasal turbinates: Secondary | ICD-10-CM | POA: Diagnosis not present

## 2016-07-24 HISTORY — DX: Chronic rhinitis: J31.0

## 2016-08-09 ENCOUNTER — Ambulatory Visit: Payer: BLUE CROSS/BLUE SHIELD | Admitting: Family

## 2016-08-14 ENCOUNTER — Encounter: Payer: Self-pay | Admitting: Family

## 2016-08-14 ENCOUNTER — Ambulatory Visit (INDEPENDENT_AMBULATORY_CARE_PROVIDER_SITE_OTHER): Payer: BLUE CROSS/BLUE SHIELD | Admitting: Family

## 2016-08-14 VITALS — BP 136/87 | HR 66 | Temp 98.6°F | Ht 74.0 in | Wt 189.5 lb

## 2016-08-14 DIAGNOSIS — I10 Essential (primary) hypertension: Secondary | ICD-10-CM

## 2016-08-14 DIAGNOSIS — B009 Herpesviral infection, unspecified: Secondary | ICD-10-CM

## 2016-08-14 DIAGNOSIS — S060X0A Concussion without loss of consciousness, initial encounter: Secondary | ICD-10-CM | POA: Diagnosis not present

## 2016-08-14 DIAGNOSIS — E785 Hyperlipidemia, unspecified: Secondary | ICD-10-CM | POA: Diagnosis not present

## 2016-08-14 LAB — BASIC METABOLIC PANEL
BUN: 15 mg/dL (ref 6–23)
CO2: 23 mEq/L (ref 19–32)
CREATININE: 1.04 mg/dL (ref 0.40–1.50)
Calcium: 9.6 mg/dL (ref 8.4–10.5)
Chloride: 107 mEq/L (ref 96–112)
GFR: 78.61 mL/min (ref >60.00)
Glucose, Bld: 105 mg/dL — ABNORMAL HIGH (ref 70–99)
POTASSIUM: 3.8 meq/L (ref 3.5–5.1)
Sodium: 140 mEq/L (ref 135–145)

## 2016-08-14 LAB — LIPID PANEL
CHOLESTEROL: 155 mg/dL (ref 0–200)
HDL: 40.7 mg/dL (ref >39.00)
LDL Cholesterol: 93 mg/dL (ref 0–99)
NonHDL: 114.49
Total CHOL/HDL Ratio: 4
Triglycerides: 107 mg/dL (ref 0.0–149.0)
VLDL: 21.4 mg/dL (ref 0.0–40.0)

## 2016-08-14 NOTE — Patient Instructions (Signed)
Please complete lab work prior to leaving.   

## 2016-08-14 NOTE — Assessment & Plan Note (Signed)
BP stable, continue caduet.  Obtain follow up bmet.

## 2016-08-14 NOTE — Assessment & Plan Note (Signed)
Stable on suppressive valtrex. cotinue same.

## 2016-08-14 NOTE — Progress Notes (Signed)
Subjective:    Patient ID: Francisco Lopez, male    DOB: 03-Dec-1960, 56 y.o.   MRN: [id number]  HPI  Francisco Lopez is a 56 yr old male who presents today for follow up.   Reports that he slipped while ice skating about 8 days ago.  Slipped and hit his head on the ice.  Reports that he walked to the car with his girlfriend and went to [place]. Girlfriend drove. Does not remember getting to [employer] or the drive over. Denies any headache, nausea vomiting. Notes an area of abdominal tenderness.  HTN- maintained on caduet. He denies CP/SOB or swelling.  BP Readings from Last 3 Encounters:  08/14/16 136/87  06/22/16 132/80  05/31/16 (!) 144/92   Depression-  Reports that his mood ha been good.    HSV1 and 2- maintained on suppressive valtrex. No outbreaks.   Hyperlipidemia-  mantained on caduet and fenofibrate. Reports that he has been trying to limit his carbs.  Lab Results  Component Value Date   CHOL 156 12/27/2015   HDL 35.90 (L) 12/27/2015   LDLDIRECT 97.0 12/27/2015   TRIG 363.0 (H) 12/27/2015   CHOLHDL 4 12/27/2015   Reports recent sinus symptoms have resolved.    Review of Systems See HPI  Past Medical History:  Diagnosis Date  . Basal cell carcinoma 2006  . Depression   . Diastolic dysfunction 123456   Per 2D echo 8/16  . Herpes simplex type 1 antibody positive 01/28/2015  . Herpes simplex type 2 infection 01/28/2015  . History of chicken pox   . Hyperlipidemia   . Hypertension      Social History   Social History  . Marital status: Legally Separated    Spouse name: N/A  . Number of children: N/A  . Years of education: N/A   Occupational History  . Not on file.   Social History Main Topics  . Smoking status: Never Smoker  . Smokeless tobacco: Never Used  . Alcohol use No  . Drug use: No  . Sexual activity: Not on file   Other Topics Concern  . 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 KNEE SURGERY Left 2000   meninscus repair    Family History  Problem Relation Age of Onset  . Diabetes Mother     type II  . Hypertension Father     No Known Allergies  Current Outpatient Prescriptions on File Prior to Visit  Medication Sig Dispense Refill  . amlodipine-atorvastatin (CADUET) 10-20 MG tablet TAKE 1 TABLET BY MOUTH EVERY DAY 90 tablet 1  . amoxicillin-clavulanate (AUGMENTIN) 875-125 MG tablet Take 1 tablet by mouth 2 (two) times daily. 20 tablet 0  . Calcium Carb-Cholecalciferol (CALCIUM-VITAMIN D) 500-200 MG-UNIT tablet Take 1 tablet by mouth daily.    . fenofibrate (TRICOR) 145 MG tablet TAKE 1 TABLET (145 MG TOTAL) BY MOUTH DAILY. 30 tablet 5  . fluticasone (FLONASE) 50 MCG/ACT nasal spray Place 2 sprays into both nostrils daily. 16 g 1  . Krill Oil 1000 MG CAPS Take by mouth.    . meloxicam (MOBIC) 7.5 MG tablet Take 1 tablet (7.5 mg total) by mouth daily. 14 tablet 0  . Multiple Vitamins-Minerals (MENS MULTIVITAMIN PLUS PO) Take by mouth.    Marland Kitchen  omeprazole (PRILOSEC) 40 MG capsule TAKE 1 CAPSULE (40 MG TOTAL) BY MOUTH DAILY. 30 capsule 3  . tadalafil (CIALIS) 5 MG tablet Take 5 mg by mouth daily as needed.    . valACYclovir (VALTREX) 500 MG tablet Take 1 tablet (500 mg total) by mouth daily. 30 tablet 5  . vitamin B-12 (CYANOCOBALAMIN) 100 MCG tablet Take 100 mcg by mouth daily.    . vitamin E 400 UNIT capsule Take 400 Units by mouth daily.    Marland Kitchen zinc gluconate 50 MG tablet Take 50 mg by mouth daily.     No current facility-administered medications on file prior to visit.     BP 136/87 (BP Location: Left Arm, Patient Position: Sitting, Cuff Size: Normal)   Pulse 66   Temp 98.6 F (37 C) (Oral)   Ht 6\' 2"  (1.88 m)   Wt 189 lb 8 oz (86 kg)   SpO2 99%   BMI 24.33 kg/m       Objective:   Physical Exam  Constitutional: He is oriented to person,  place, and time. He appears well-developed and well-nourished. No distress.  HENT:  Head: Normocephalic and atraumatic.  Eyes: Pupils are equal, round, and reactive to light.  Cardiovascular: Normal rate and regular rhythm.   No murmur heard. Pulmonary/Chest: Effort normal and breath sounds normal. No respiratory distress. He has no wheezes. He has no rales.  Abdominal: Soft. He exhibits no distension. There is no rebound and no guarding.  Mild area of abdominal wall tenderness, RUQ without guarding, mass or obvious hernia.   Musculoskeletal: He exhibits no edema.  Neurological: He is alert and oriented to person, place, and time. He displays normal reflexes. He exhibits normal muscle tone. Coordination normal.  Skin: Skin is warm and dry.  Psychiatric: He has a normal mood and affect. His behavior is normal. Thought content normal.          Assessment & Plan:  Concussion- history consistent with concussion. He is not having any residual effects and his neuro exam is normal. Advised patient to call if headache, nausea/vomitting.

## 2016-08-14 NOTE — Progress Notes (Signed)
Pre visit review using our clinic review tool, if applicable. No additional management support is needed unless otherwise documented below in the visit note. 

## 2016-08-14 NOTE — Assessment & Plan Note (Signed)
Stable off of meds.  Monitor.  

## 2016-08-14 NOTE — Assessment & Plan Note (Signed)
Reinforced importance of diet.  Obtain lipid panel.

## 2016-08-22 ENCOUNTER — Other Ambulatory Visit: Payer: Self-pay | Admitting: Physician Assistant

## 2016-08-23 DIAGNOSIS — F4323 Adjustment disorder with mixed anxiety and depressed mood: Secondary | ICD-10-CM | POA: Diagnosis not present

## 2016-08-23 MED ORDER — VALACYCLOVIR HCL 500 MG PO TABS: 500.0000 mg | ORAL_TABLET | Freq: Every day | ORAL | 5 refills | Status: DC

## 2016-08-25 ENCOUNTER — Encounter: Payer: Self-pay | Admitting: Family

## 2016-08-25 ENCOUNTER — Ambulatory Visit (INDEPENDENT_AMBULATORY_CARE_PROVIDER_SITE_OTHER): Payer: BLUE CROSS/BLUE SHIELD | Admitting: Family

## 2016-08-25 DIAGNOSIS — F3112 Bipolar disorder, current episode manic without psychotic features, moderate: Secondary | ICD-10-CM

## 2016-08-25 DIAGNOSIS — F39 Unspecified mood [affective] disorder: Secondary | ICD-10-CM

## 2016-08-25 HISTORY — DX: Unspecified mood (affective) disorder: F39

## 2016-08-25 MED ORDER — QUETIAPINE FUMARATE 50 MG PO TABS: ORAL_TABLET | ORAL | 0 refills | Status: DC

## 2016-08-25 NOTE — Progress Notes (Signed)
Subjective:    Patient ID: Francisco Lopez, male    DOB: 13-Jan-1961, 56 y.o.   MRN: [id number]  HPI  Francisco Lopez is a 56 yr old male who presents today to discuss mood swings. Reports that his mood swings are "very high and low."  Gets fixated on things.  Mind races, then becomes very irritable.  Not sleeping well at night  Reports that he feels "like I can do anything I want to, anything."  He is only sleeping 2-3 hours a night for the last several months.  Very talkative.  + thoughts racing.  He is easily distracted, trouble staying on subject when carrying a conversation.  Bought a personal truck which was $41K that he did not need because he had 2 trucks at home that were fully paid for.  Has had increased interest in sex.  Obsesses over thought that are not ture, ie. That his girlfriend had dated a guy she had not dated.  Denies SI/HI.  Feels like his brain is firing off "all the time."  Reports some hopelessness, no SI or plan.  Has a brother who committed suicide, another brother murdered his wife.  Sister has mental health issues, "hateful 24-7"and she has mood swings.    He has been on seroquel in the past. Was only on 25mg .  Patient stated that he was on seroquel for "sleep" previously. He denied hx of bipolar disorder.  He is accompanied by his girlfriend today who supports the history.   Review of Systems See HPI  Past Medical History:  Diagnosis Date  . Basal cell carcinoma 2006  . Depression   . Diastolic dysfunction 7/32/2025   Per 2D echo 8/16  . Herpes simplex type 1 antibody positive 01/28/2015  . Herpes simplex type 2 infection 01/28/2015  . History of chicken pox   . Hyperlipidemia   . Hypertension      Social History   Social History  . Marital status: Legally Separated    Spouse name: N/A  . Number of children: N/A  . Years of education: N/A   Occupational History  . Not on file.   Social History Main Topics  . Smoking status: Never Smoker  . Smokeless  tobacco: Never Used  . Alcohol use No  . Drug use: No  . Sexual activity: Not on file   Other Topics Concern  . 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 KNEE SURGERY Left 2000   meninscus repair    Family History  Problem Relation Age of Onset  . Diabetes Mother     type II  . Hypertension Father     No Known Allergies  Current Outpatient Prescriptions on File Prior to Visit  Medication Sig Dispense Refill  . amlodipine-atorvastatin (CADUET) 10-20 MG tablet TAKE 1 TABLET BY MOUTH EVERY DAY 90 tablet 1  . Calcium Carb-Cholecalciferol (CALCIUM-VITAMIN D) 500-200 MG-UNIT tablet Take 1 tablet by mouth daily.    . fenofibrate (TRICOR) 145 MG tablet TAKE 1 TABLET (145 MG TOTAL) BY MOUTH DAILY. 30 tablet 5  . fluticasone (FLONASE) 50 MCG/ACT nasal spray Place 2 sprays into both nostrils daily. 16 g 1  . Krill Oil 1000 MG CAPS Take by mouth.    . meloxicam (MOBIC) 7.5  MG tablet Take 1 tablet (7.5 mg total) by mouth daily. 14 tablet 0  . Multiple Vitamins-Minerals (MENS MULTIVITAMIN PLUS PO) Take by mouth.    Marland Kitchen omeprazole (PRILOSEC) 40 MG capsule TAKE 1 CAPSULE (40 MG TOTAL) BY MOUTH DAILY. 30 capsule 3  . tadalafil (CIALIS) 5 MG tablet Take 5 mg by mouth daily as needed.    . valACYclovir (VALTREX) 500 MG tablet Take 1 tablet (500 mg total) by mouth daily. 30 tablet 5  . vitamin B-12 (CYANOCOBALAMIN) 100 MCG tablet Take 100 mcg by mouth daily.    . vitamin E 400 UNIT capsule Take 400 Units by mouth daily.    Marland Kitchen zinc gluconate 50 MG tablet Take 50 mg by mouth daily.     No current facility-administered medications on file prior to visit.     BP 126/82 (BP Location: Right Arm, Patient Position: Sitting, Cuff Size: Large)   Pulse 74   Temp 98.5 F (36.9 C) (Oral)   Resp 16   Ht 6\' 2"  (1.88 m)   Wt 184  lb 3.2 oz (83.6 kg)   SpO2 98%   BMI 23.65 kg/m       Objective:   Physical Exam  Constitutional: He is oriented to person, place, and time. He appears well-developed and well-nourished. No distress.  HENT:  Head: Normocephalic and atraumatic.  Musculoskeletal: He exhibits no edema.  Lymphadenopathy:    He has no cervical adenopathy.  Neurological: He is alert and oriented to person, place, and time.  Skin: Skin is warm and dry.  Psychiatric: Thought content normal.  Calm at today's visit. Briefly tearful. Slightly flattened affect          Assessment & Plan:  Seeing Pennelope Bracken [phone] for counseling.

## 2016-08-25 NOTE — Progress Notes (Signed)
Pre visit review using our clinic review tool, if applicable. No additional management support is needed unless otherwise documented below in the visit note. 

## 2016-08-25 NOTE — Assessment & Plan Note (Signed)
35 minutes spent with pt today.  >50% of this time was spent counseling patient on bipolar disorder/treatment.  Advised pt as follows:  Please begin seroquel 50mg  today, 100mg  tomorrow, then 150mg  (3 tabs).   Continue your work with your therapist.  Contact psychiatry to schedule an appointment. Go to the ER if you develop thoughts of hurting yourself or others.  Follow up in 2 weeks.

## 2016-08-25 NOTE — Patient Instructions (Addendum)
Please begin seroquel 50mg  today, 100mg  tomorrow, then 150mg  (3 tabs).   Continue your work with your therapist.  Contact psychiatry to schedule an appointment. Go to the ER if you develop thoughts of hurting yourself or others.   Psychiatric Services:  [facility], [place] [address] [place], [place] [facility] [place], [place], [facility], [address], [place], [place]

## 2016-08-27 NOTE — Progress Notes (Signed)
I think Seroquel was a good choice, sounds like bipolar d/o most likely

## 2016-08-28 ENCOUNTER — Encounter: Payer: Self-pay | Admitting: Family

## 2016-08-30 DIAGNOSIS — F3112 Bipolar disorder, current episode manic without psychotic features, moderate: Secondary | ICD-10-CM | POA: Diagnosis not present

## 2016-08-30 DIAGNOSIS — F4323 Adjustment disorder with mixed anxiety and depressed mood: Secondary | ICD-10-CM | POA: Diagnosis not present

## 2016-09-01 ENCOUNTER — Encounter: Payer: Self-pay | Admitting: Family

## 2016-09-01 NOTE — Telephone Encounter (Signed)
Patient request call back to find out if he is going to be referred to another doctor for counseling

## 2016-09-04 ENCOUNTER — Telehealth: Payer: Self-pay | Admitting: Family

## 2016-09-04 NOTE — Telephone Encounter (Signed)
Caller name: Relationship to patient: Self Can be reached: [phone] Pharmacy:  Reason for call: Patient states that when he takes Seroquel at night it ware off about  1:00 PM each day and he gets agitated and irritable. Wants to know if he needs to increase the dosage. Plse adv

## 2016-09-05 ENCOUNTER — Telehealth: Payer: Self-pay | Admitting: *Deleted

## 2016-09-05 DIAGNOSIS — J310 Chronic rhinitis: Secondary | ICD-10-CM | POA: Diagnosis not present

## 2016-09-05 DIAGNOSIS — J3489 Other specified disorders of nose and nasal sinuses: Secondary | ICD-10-CM | POA: Diagnosis not present

## 2016-09-05 MED ORDER — QUETIAPINE FUMARATE ER 150 MG PO TB24: 150.0000 mg | ORAL_TABLET | Freq: Every day | ORAL | 0 refills | Status: DC

## 2016-09-05 NOTE — Telephone Encounter (Signed)
Left message for pt to return my call.

## 2016-09-05 NOTE — Telephone Encounter (Signed)
Received email that Seroquel XR will require prior auth. PA completed via covermymeds and forwarded to [facility] for decision. Sent mychart message to pt.  Awaiting determination.

## 2016-09-05 NOTE — Telephone Encounter (Signed)
I sent an Rx for 150mg  seroquel extended release.  He should begin tonight. Follow up as scheduled on 4/2/218.

## 2016-09-05 NOTE — Telephone Encounter (Signed)
Notified pt Rx was sent to pharmacy and he should start taking it tonight.Pt voiced understanding.

## 2016-09-07 NOTE — Telephone Encounter (Addendum)
Relation to XA:FHSV  Call back number:[phone]   Reason for call:  Patient checking on the status of PA,

## 2016-09-11 ENCOUNTER — Ambulatory Visit: Payer: BLUE CROSS/BLUE SHIELD | Admitting: Family

## 2016-09-11 NOTE — Telephone Encounter (Signed)
Received approval via covermymeds on 09/07/16. Mychart message sent to pt.

## 2016-09-13 DIAGNOSIS — F3112 Bipolar disorder, current episode manic without psychotic features, moderate: Secondary | ICD-10-CM | POA: Diagnosis not present

## 2016-09-13 DIAGNOSIS — F4323 Adjustment disorder with mixed anxiety and depressed mood: Secondary | ICD-10-CM | POA: Diagnosis not present

## 2016-09-18 ENCOUNTER — Encounter: Payer: Self-pay | Admitting: Family

## 2016-09-18 ENCOUNTER — Ambulatory Visit (INDEPENDENT_AMBULATORY_CARE_PROVIDER_SITE_OTHER): Payer: BLUE CROSS/BLUE SHIELD | Admitting: Family

## 2016-09-18 VITALS — BP 138/78 | HR 76 | Temp 98.5°F | Resp 16 | Ht 74.0 in | Wt 186.6 lb

## 2016-09-18 DIAGNOSIS — F3173 Bipolar disorder, in partial remission, most recent episode manic: Secondary | ICD-10-CM | POA: Diagnosis not present

## 2016-09-18 MED ORDER — QUETIAPINE FUMARATE ER 150 MG PO TB24
150.0000 mg | ORAL_TABLET | Freq: Every day | ORAL | 3 refills | Status: DC
Start: 2016-09-18 — End: 2017-04-13

## 2016-09-18 NOTE — Patient Instructions (Addendum)
Please continue your current dose of seroquel.  Call if you have any issues with mood swings/depression.

## 2016-09-18 NOTE — Progress Notes (Signed)
Subjective:    Patient ID: Francisco Lopez, male    DOB: 08/29/1960, 56 y.o.   MRN: [id number]  HPI   Francisco Lopez is a 56 yr old male who presents today for follow up of his Bipolar/mania. Last visit he was placed on seroquel.   Reports that he is no longer feeling like he can "do anything I want to."  Thoughts are no longer racing as much. He reports that his focus has improved.  Less obsessive thoughts.  He has had a few mood swings but much better.  Attitude is more positive.  Since he transitioned to the extended release seroquel he notes that he has had more trouble falling asleep.     Review of Systems See HPI  Past Medical History:  Diagnosis Date  . Basal cell carcinoma 2006  . Depression   . Diastolic dysfunction 7/90/2409   Per 2D echo 8/16  . Herpes simplex type 1 antibody positive 01/28/2015  . Herpes simplex type 2 infection 01/28/2015  . History of chicken pox   . Hyperlipidemia   . Hypertension      Social History   Social History  . Marital status: Legally Separated    Spouse name: N/A  . Number of children: N/A  . Years of education: N/A   Occupational History  . Not on file.   Social History Main Topics  . Smoking status: Never Smoker  . Smokeless tobacco: Never Used  . Alcohol use No  . Drug use: No  . Sexual activity: Not on file   Other Topics Concern  . 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 KNEE SURGERY Left 2000   meninscus repair    Family History  Problem Relation Age of Onset  . Diabetes Mother     type II  . Hypertension Father     No Known Allergies  Current Outpatient Prescriptions on File Prior to Visit  Medication Sig Dispense Refill  . amlodipine-atorvastatin (CADUET) 10-20 MG tablet TAKE 1 TABLET BY MOUTH EVERY DAY 90 tablet 1  . fenofibrate  (TRICOR) 145 MG tablet TAKE 1 TABLET (145 MG TOTAL) BY MOUTH DAILY. 30 tablet 5  . fluticasone (FLONASE) 50 MCG/ACT nasal spray Place 2 sprays into both nostrils daily. 16 g 1  . Krill Oil 1000 MG CAPS Take by mouth.    . Multiple Vitamins-Minerals (MENS MULTIVITAMIN PLUS PO) Take by mouth.    Marland Kitchen omeprazole (PRILOSEC) 40 MG capsule TAKE 1 CAPSULE (40 MG TOTAL) BY MOUTH DAILY. 30 capsule 3  . tadalafil (CIALIS) 5 MG tablet Take 5 mg by mouth daily as needed.    . valACYclovir (VALTREX) 500 MG tablet Take 1 tablet (500 mg total) by mouth daily. 30 tablet 5   No current facility-administered medications on file prior to visit.     BP 138/78 (BP Location: Left Arm, Cuff Size: Normal)   Pulse 76   Temp 98.5 F (36.9 C) (Oral)   Resp 16   Ht 6\' 2"  (1.88 m)   Wt 186 lb 9.6 oz (84.6 kg)   SpO2 99% Comment: room air  BMI 23.96 kg/m       Objective:   Physical Exam  Constitutional: He is oriented to person, place, and time. He  appears well-developed and well-nourished. No distress.  Neurological: He is alert and oriented to person, place, and time.  Psychiatric: He has a normal mood and affect. His behavior is normal. Judgment and thought content normal.          Assessment & Plan:  Bipolar disorder- Manic symptoms/irritability/depression improved. Tolerating seroquel. Continue same.  He is advised to keep his upcoming appointment with psychiatry and continues to work with a therapist. In regards to his insomnia, he wishes to avoid controlled substances. We discussed melatonin, but this causes him HA. We discussed L- Tryptophan or prn benadryl which he will consider. 15 min spent counseling patient today.

## 2016-09-18 NOTE — Progress Notes (Signed)
Pre visit review using our clinic review tool, if applicable. No additional management support is needed unless otherwise documented below in the visit note. 

## 2016-10-04 DIAGNOSIS — F3112 Bipolar disorder, current episode manic without psychotic features, moderate: Secondary | ICD-10-CM | POA: Diagnosis not present

## 2016-10-25 DIAGNOSIS — F3112 Bipolar disorder, current episode manic without psychotic features, moderate: Secondary | ICD-10-CM | POA: Diagnosis not present

## 2016-10-25 DIAGNOSIS — F4323 Adjustment disorder with mixed anxiety and depressed mood: Secondary | ICD-10-CM | POA: Diagnosis not present

## 2016-11-14 ENCOUNTER — Ambulatory Visit: Payer: BLUE CROSS/BLUE SHIELD | Admitting: Family

## 2016-11-15 ENCOUNTER — Ambulatory Visit (INDEPENDENT_AMBULATORY_CARE_PROVIDER_SITE_OTHER): Payer: BLUE CROSS/BLUE SHIELD | Admitting: Family

## 2016-11-15 VITALS — BP 140/75 | HR 72 | Temp 98.3°F | Resp 16 | Wt 185.4 lb

## 2016-11-15 DIAGNOSIS — E785 Hyperlipidemia, unspecified: Secondary | ICD-10-CM

## 2016-11-15 DIAGNOSIS — F3111 Bipolar disorder, current episode manic without psychotic features, mild: Secondary | ICD-10-CM | POA: Diagnosis not present

## 2016-11-15 DIAGNOSIS — F3112 Bipolar disorder, current episode manic without psychotic features, moderate: Secondary | ICD-10-CM | POA: Diagnosis not present

## 2016-11-15 DIAGNOSIS — I10 Essential (primary) hypertension: Secondary | ICD-10-CM | POA: Diagnosis not present

## 2016-11-15 DIAGNOSIS — F4323 Adjustment disorder with mixed anxiety and depressed mood: Secondary | ICD-10-CM | POA: Diagnosis not present

## 2016-11-15 NOTE — Assessment & Plan Note (Signed)
Mood is stable on seroquel

## 2016-11-15 NOTE — Progress Notes (Signed)
Subjective:    Patient ID: Francisco Lopez, male    DOB: 1960/09/03, 56 y.o.   MRN: [id number]  HPI   Mr. Googe is a 56 yr old male who presents today for follow up.    Bipolar disorder- he continues seroquel. Reports that he is sleeping better.  Takes seroquel at night.  Thoughts are no longer racing. Work is going well. Mood is good. Has been more active.  Denies any manic behavior.   HTN- maintained on caduet.  BP Readings from Last 3 Encounters:  11/15/16 140/75  09/18/16 138/78  08/25/16 126/82   Hyperlipidemia- maintianed on caduet.   Lab Results  Component Value Date   CHOL 155 08/14/2016   HDL 40.70 08/14/2016   LDLCALC 93 08/14/2016   LDLDIRECT 97.0 12/27/2015   TRIG 107.0 08/14/2016   CHOLHDL 4 08/14/2016    Review of Systems    see HPI  Past Medical History:  Diagnosis Date  . Basal cell carcinoma 2006  . Depression   . Diastolic dysfunction 12/07/3149   Per 2D echo 8/16  . Herpes simplex type 1 antibody positive 01/28/2015  . Herpes simplex type 2 infection 01/28/2015  . History of chicken pox   . Hyperlipidemia   . Hypertension      Social History   Social History  . Marital status: Legally Separated    Spouse name: N/A  . Number of children: N/A  . Years of education: N/A   Occupational History  . Not on file.   Social History Main Topics  . Smoking status: Never Smoker  . Smokeless tobacco: Never Used  . Alcohol use No  . Drug use: No  . Sexual activity: Not on file   Other Topics Concern  . 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 KNEE SURGERY Left 2000   meninscus repair    Family History  Problem Relation Age of Onset  . Diabetes Mother        type II  . Hypertension Father     No Known Allergies  Current Outpatient Prescriptions on File Prior  to Visit  Medication Sig Dispense Refill  . amlodipine-atorvastatin (CADUET) 10-20 MG tablet TAKE 1 TABLET BY MOUTH EVERY DAY 90 tablet 1  . fenofibrate (TRICOR) 145 MG tablet TAKE 1 TABLET (145 MG TOTAL) BY MOUTH DAILY. 30 tablet 5  . fluticasone (FLONASE) 50 MCG/ACT nasal spray Place 2 sprays into both nostrils daily. 16 g 1  . Krill Oil 1000 MG CAPS Take by mouth.    . Multiple Vitamins-Minerals (MENS MULTIVITAMIN PLUS PO) Take by mouth.    Marland Kitchen omeprazole (PRILOSEC) 40 MG capsule TAKE 1 CAPSULE (40 MG TOTAL) BY MOUTH DAILY. 30 capsule 3  . QUEtiapine Fumarate (SEROQUEL XR) 150 MG 24 hr tablet Take 1 tablet (150 mg total) by mouth at bedtime. 30 tablet 3  . tadalafil (CIALIS) 5 MG tablet Take 5 mg by mouth daily as needed.    . valACYclovir (VALTREX) 500 MG tablet Take 1 tablet (500 mg total) by mouth daily. 30 tablet 5   No current facility-administered medications on file prior to visit.     BP 140/75   Pulse 72   Temp 98.3 F (36.8 C)   Resp 16  Wt 185 lb 6.4 oz (84.1 kg)   BMI 23.80 kg/m    Objective:   Physical Exam  Constitutional: He is oriented to person, place, and time. He appears well-developed and well-nourished. No distress.  HENT:  Head: Normocephalic and atraumatic.  Cardiovascular: Normal rate and regular rhythm.   No murmur heard. Pulmonary/Chest: Effort normal and breath sounds normal. No respiratory distress. He has no wheezes. He has no rales.  Musculoskeletal: He exhibits no edema.  Neurological: He is alert and oriented to person, place, and time.  Skin: Skin is warm and dry.  Psychiatric: He has a normal mood and affect. His behavior is normal. Thought content normal.          Assessment & Plan:

## 2016-11-15 NOTE — Assessment & Plan Note (Signed)
BP Is acceptable on amlodipine. Continue same.

## 2016-11-15 NOTE — Assessment & Plan Note (Signed)
Stable on statin. Continue same.  

## 2016-12-18 DIAGNOSIS — F411 Generalized anxiety disorder: Secondary | ICD-10-CM | POA: Diagnosis not present

## 2016-12-27 DIAGNOSIS — F4323 Adjustment disorder with mixed anxiety and depressed mood: Secondary | ICD-10-CM | POA: Diagnosis not present

## 2017-01-10 ENCOUNTER — Other Ambulatory Visit: Payer: Self-pay | Admitting: Medical

## 2017-01-10 DIAGNOSIS — F4323 Adjustment disorder with mixed anxiety and depressed mood: Secondary | ICD-10-CM | POA: Diagnosis not present

## 2017-01-10 DIAGNOSIS — F3112 Bipolar disorder, current episode manic without psychotic features, moderate: Secondary | ICD-10-CM | POA: Diagnosis not present

## 2017-01-16 ENCOUNTER — Other Ambulatory Visit: Payer: Self-pay | Admitting: Family

## 2017-01-17 DIAGNOSIS — F3112 Bipolar disorder, current episode manic without psychotic features, moderate: Secondary | ICD-10-CM | POA: Diagnosis not present

## 2017-01-17 DIAGNOSIS — F4323 Adjustment disorder with mixed anxiety and depressed mood: Secondary | ICD-10-CM | POA: Diagnosis not present

## 2017-01-18 ENCOUNTER — Other Ambulatory Visit (HOSPITAL_COMMUNITY): Payer: BLUE CROSS/BLUE SHIELD | Attending: Psychiatry | Admitting: Psychiatry

## 2017-01-18 ENCOUNTER — Encounter (HOSPITAL_COMMUNITY): Payer: Self-pay | Admitting: Psychiatry

## 2017-01-18 DIAGNOSIS — E785 Hyperlipidemia, unspecified: Secondary | ICD-10-CM | POA: Diagnosis not present

## 2017-01-18 DIAGNOSIS — F418 Other specified anxiety disorders: Secondary | ICD-10-CM | POA: Diagnosis not present

## 2017-01-18 DIAGNOSIS — Z79899 Other long term (current) drug therapy: Secondary | ICD-10-CM | POA: Diagnosis not present

## 2017-01-18 DIAGNOSIS — I10 Essential (primary) hypertension: Secondary | ICD-10-CM | POA: Diagnosis not present

## 2017-01-18 DIAGNOSIS — F411 Generalized anxiety disorder: Secondary | ICD-10-CM | POA: Diagnosis not present

## 2017-01-18 DIAGNOSIS — F3131 Bipolar disorder, current episode depressed, mild: Secondary | ICD-10-CM

## 2017-01-18 DIAGNOSIS — F329 Major depressive disorder, single episode, unspecified: Secondary | ICD-10-CM | POA: Diagnosis not present

## 2017-01-18 NOTE — Progress Notes (Signed)
Comprehensive Clinical Assessment (CCA) Note  01/18/2017 Francisco Lopez [id number]  Visit Diagnosis:      ICD-10-CM   1. Bipolar affective disorder, currently depressed, mild ([place]) F31.31       CCA Part One  Part One has been completed on paper by the patient.  (See scanned document in Chart Review)  CCA Part Two A  Intake/Chief Complaint:  CCA Intake With Chief Complaint CCA Part Two Date: 01/18/17 CCA Part Two Time: 1600 Chief Complaint/Presenting Problem: This is a 56 yr old, divorced, employed, Caucasian male who was referred per Pennelope Bracken, LCSW; treatment for worsening anxiety symptoms; with distorted thinking.  Denies SI/HI or A/V hallucinations.  Stressor:  1) Conflictual relationships:  According to pt his distorted thinking is affecting his relationship of yrs with his girlfriend.  Pt has trust issues steming from his childhood.  States he continues to have mistrust issues in all his relationships.  Reports he has the tendency to overanalyze things and the other person gets very frustrated with him until it causes a lot of problems.  No prior inpt psychiatric hospitalizations.  Denies any suicide attempts or gestures.  Currently under the services of Dr. Toy Care and Pennelope Bracken, LCSW.  Family Hx:  Deceased Father (ETOH) and Deceased Mother (Anxiety).                                                                                             Patients Currently Reported Symptoms/Problems: Sadness, loss of motivation, decreased appetite and sleep, anxious Collateral Involvement: Daughter is very supportive. Individual's Strengths: Pt is motivated for treatment. Individual's Abilities: Self starter Type of Services Patient Feels Are Needed: MH-IOP  Mental Health Symptoms Depression:  Depression: Change in energy/activity, Increase/decrease in appetite, Sleep (too much or little), Irritability  Mania:  Mania: N/A  Anxiety:   Anxiety: Worrying  Psychosis:  Psychosis: N/A  Trauma:  Trauma:  N/A  Obsessions:  Obsessions: Intrusive/time consuming, Cause anxiety  Compulsions:  Compulsions: N/A  Inattention:  Inattention: N/A  Hyperactivity/Impulsivity:  Hyperactivity/Impulsivity: N/A  Oppositional/Defiant Behaviors:  Oppositional/Defiant Behaviors: N/A  Borderline Personality:  Emotional Irregularity: N/A  Other Mood/Personality Symptoms:      Mental Status Exam Appearance and self-care  Stature:  Stature: Average  Weight:  Weight: Average weight  Clothing:  Clothing: Casual  Grooming:  Grooming: Normal  Cosmetic use:  Cosmetic Use: None  Posture/gait:  Posture/Gait: Normal  Motor activity:  Motor Activity: Not Remarkable  Sensorium  Attention:  Attention: Normal  Concentration:  Concentration: Normal  Orientation:  Orientation: X5  Recall/memory:  Recall/Memory: Normal  Affect and Mood  Affect:  Affect: Appropriate  Mood:  Mood: Anxious  Relating  Eye contact:  Eye Contact: Normal  Facial expression:  Facial Expression: Responsive  Attitude toward examiner:  Attitude Toward Examiner: Cooperative  Thought and Language  Speech flow: Speech Flow: Normal  Thought content:  Thought Content: Appropriate to mood and circumstances  Preoccupation:     Hallucinations:     Organization:     Transport planner of Knowledge:  Fund of Knowledge: Average  Intelligence:  Intelligence: Average  Abstraction:  Abstraction: Normal  Judgement:  Judgement: Normal  Reality Testing:  Reality Testing: Distorted  Insight:  Insight: Good  Decision Making:  Decision Making: Normal  Social Functioning  Social Maturity:  Social Maturity: Responsible  Social Judgement:  Social Judgement: Normal  Stress  Stressors:  Stressors: Family conflict  Coping Ability:  Coping Ability: Resilient  Skill Deficits:     Supports:      Family and Psychosocial History: Family history Marital status: Divorced Divorced, when?: Was married to paranoid-schizophrenic wife for 30 yrs; divorced  2014. What types of issues is patient dealing with in the relationship?: distorted trust issues Are you sexually active?: Yes What is your sexual orientation?: heterosexual Does patient have children?: Yes How many children?: 1 How is patient's relationship with their children?: Daughter was conceived and born when pt was age 84.  She is 56 yrs old and very close to her.  Childhood History:  Childhood History By whom was/is the patient raised?: Both parents Additional childhood history information: Born in [place], [place].  Father was alcoholic.  Pt states he left home at age 23.  States childhood was dysfunctional.  "My mother did the best she could for [place].  She had diabetes and died at age 65 and my father died at age 9."  According to pt, he disconnected from friends at school.  "I was a daydreamer."                                    Does patient have siblings?: Yes Number of Siblings: 4 Description of patient's current relationship with siblings: Has 2 sisters and 2 brothers.  States he's not close to them. 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 has been impacted by current illness: No Has patient ever been in the [employer]?: No Has patient ever served in combat?: No Did You Receive Any Psychiatric Treatment/Services While in Passenger transport manager?: No Are There Guns or Other Weapons in [place]?: No  Education: Education Last Grade  Completed: 6 Did Teacher, adult education From [employer]?: No Did You Nutritional therapist?: No Did Heritage manager?: No Did You Have An Individualized Education Program (IIEP): No Did You Have Any Difficulty At [employer]?: Yes (only went to 6th grade) Were Any Medications Ever Prescribed For These Difficulties?: No  Religion: Religion/Spirituality Are You A Religious Person?: Yes What is Your Religious Affiliation?: Christian  Leisure/Recreation: Leisure / Recreation Leisure and Hobbies: Therapist, music Work  Exercise/Diet: Exercise/Diet Do You Exercise?: Yes What Type of Exercise Do You Do?: Bike How Many Times a Week Do You Exercise?: 1-3 times a week Have You Gained or Lost A Significant Amount of Weight in the Past Six Months?: No Do You Follow a Special Diet?: No Do You Have Any Trouble Sleeping?: Yes Explanation of Sleeping Difficulties: decreased sleep d/t ruminating thoughts  CCA Part Two C  Alcohol/Drug Use: Alcohol / Drug Use History of alcohol /  drug use?: No history of alcohol / drug abuse                      CCA Part Three  ASAM's:  Six Dimensions of Multidimensional Assessment  Dimension 1:  Acute Intoxication and/or Withdrawal Potential:     Dimension 2:  Biomedical Conditions and Complications:     Dimension 3:  Emotional, Behavioral, or Cognitive Conditions and Complications:     Dimension 4:  Readiness to Change:     Dimension 5:  Relapse, Continued use, or Continued Problem Potential:     Dimension 6:  Recovery/Living Environment:      Substance use Disorder (SUD)    Social Function:  Social Functioning Social Maturity: Responsible Social Judgement: Normal  Stress:  Stress Stressors: Family conflict Coping Ability: Resilient Patient Takes Medications The Way The Doctor Instructed?: Yes Priority Risk: Moderate Risk  Risk Assessment- Self-Harm Potential: Risk Assessment For Self-Harm Potential Thoughts of Self-Harm: No current  thoughts Method: No plan Availability of Means: No access/NA  Risk Assessment -Dangerous to Others Potential: Risk Assessment For Dangerous to Others Potential Method: No Plan Availability of Means: No access or NA Intent: Vague intent or NA  DSM5 Diagnoses: Patient Active Problem List   Diagnosis Date Noted  . Bipolar disorder ([place]) 08/25/2016  . Carpal tunnel syndrome 02/07/2016  . GERD (gastroesophageal reflux disease) 12/27/2015  . History of obstructive sleep apnea 10/29/2015  . Diastolic dysfunction 67/20/9470  . Herpes simplex type 1 antibody positive 01/28/2015  . Herpes simplex type 2 infection 01/28/2015  . Preventative health care 09/16/2014  . Erectile dysfunction 09/16/2014  . Depression 08/05/2014  . HTN (hypertension) 08/05/2014  . Hyperlipidemia 08/05/2014    Patient Centered Plan: Patient is on the following Treatment Plan(s):  Anxiety and Low Self-Esteem  Recommendations for Services/Supports/Treatments: Recommendations for Services/Supports/Treatments Recommendations For Services/Supports/Treatments: IOP (Intensive Outpatient Program)  Treatment Plan Summary:  Oriented pt to MH-IOP.  Provided orientation folder.  Encouraged support groups.  Informed Dr. Toy Care and Pennelope Bracken, LCSW of admit.  Referrals to Alternative Service(s): Referred to Alternative Service(s):   Place:   Date:   Time:    Referred to Alternative Service(s):   Place:   Date:   Time:    Referred to Alternative Service(s):   Place:   Date:   Time:    Referred to Alternative Service(s):   Place:   Date:   Time:     CLARK, RITA, M.Ed, CNA

## 2017-01-18 NOTE — Progress Notes (Signed)
    Daily Group Progress Note  Program: IOP  Group Time: 9:00-12:00  Participation Level: Active  Behavioral Response: Appropriate  Type of Therapy:  Group Therapy  Summary of Progress: Pt.'s first day in group. Pt. Met with psychiatrist and case manager to complete intake paperwork. Pt. Introduced himself to the group and stated that he wanted to work on his pattern of cognitive distortions that were affecting his relationships. Pt. Watched [employer] video and discussed the importance of vulnerability to developing resilience to shame.     Nancie Neas, LPC

## 2017-01-18 NOTE — Progress Notes (Signed)
Psychiatric Initial Adult Assessment   Patient Identification: Francisco Lopez MRN:  [id number] Date of Evaluation:  01/18/2017 Referral Source: his therapist Chief Complaint:   Chief Complaint    Depression; Anxiety; Stress     Visit Diagnosis: generalized anxiety with secondary depression  History of Present Illness:  Francisco Lopez is anxious about his relationship with his girlfriend of several years.  His former wife suffered from increasingly worse schizophrenia leading to divorce.  In new relationships he finds himself worrying about the relationship and mistrusting the partner for no apparent reason than his general mistrust of others.  The worry leads him to over analyze his fears which frustrates the other person and jeopardizes the relationship.  He then worries he caused the strain but does not know how to stop himself.  He had a very dysfunctional childhood with a severely alcoholic father who was unpredictable and who was verbally abusive to Francisco Lopez' Lopez.  They never knew when they would have to flee the house or where they would be staying or going to school or even if he would go to school at all.  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  He was the youngest of 6 children all of whom left as soon as they could and they are not close as adults.  He does get depressed at times because he feels stuck in not being able to relax and trust his girlfriend.  She is a very positive influence on him he says.  Associated Signs/Symptoms: Depression Symptoms:  depressed mood, anxiety, loss of energy/fatigue, (Hypo) Manic Symptoms:  Irritable Mood, Anxiety Symptoms:  Excessive Worry, Psychotic Symptoms:  none PTSD Symptoms: Negative  Past Psychiatric History: none  Previous Psychotropic Medications: No   Substance Abuse History in the last 12 months:  No.  Consequences of Substance Abuse: Negative  Past Medical History:  Past Medical History:  Diagnosis  Date  . Anxiety   . Basal cell carcinoma 2006  . Depression   . Diastolic dysfunction 5/63/1497   Per 2D echo 8/16  . Herpes simplex type 1 antibody positive 01/28/2015  . Herpes simplex type 2 infection 01/28/2015  . History of chicken pox   . Hyperlipidemia   . Hypertension     Past Surgical History:  Procedure Laterality Date  . KNEE SURGERY Left 2000   meninscus repair    Family Psychiatric History: depression in Lopez and father and alcoholism in father  Family History:  Family History  Problem Relation Age of Onset  . Diabetes Lopez        type II  . Anxiety disorder Lopez   . Hypertension Father   . Alcohol abuse Father     Social History:   Social History   Social History  . Marital status: Legally Separated    Spouse name: N/A  . Number of children: N/A  . Years of education: N/A   Social History Main Topics  . Smoking status: Never Smoker  . Smokeless tobacco: Never Used  . Alcohol use No  . Drug use: No  . Sexual activity: Yes   Other Topics Concern  . 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  Self taught in most areas.  Turned out to be very  resourceful  Allergies:  No Known Allergies  Metabolic Disorder Labs: Lab Results  Component Value Date   HGBA1C 5.5 01/05/2016   No results found for: PROLACTIN Lab Results  Component Value Date   CHOL 155 08/14/2016   TRIG 107.0 08/14/2016   HDL 40.70 08/14/2016   CHOLHDL 4 08/14/2016   VLDL 21.4 08/14/2016   LDLCALC 93 08/14/2016     Current Medications: Current Outpatient Prescriptions  Medication Sig Dispense Refill  . amlodipine-atorvastatin (CADUET) 10-20 MG tablet TAKE 1 TABLET BY MOUTH EVERY DAY 90 tablet 0  . fenofibrate (TRICOR) 145 MG tablet TAKE 1 TABLET (145 MG TOTAL) BY MOUTH DAILY. 30 tablet 5  .  fluticasone (FLONASE) 50 MCG/ACT nasal spray PLACE 2 SPRAYS INTO BOTH NOSTRILS DAILY. 16 g 1  . Krill Oil 1000 MG CAPS Take by mouth.    . Multiple Vitamins-Minerals (MENS MULTIVITAMIN PLUS PO) Take by mouth.    Marland Kitchen omeprazole (PRILOSEC) 40 MG capsule TAKE 1 CAPSULE (40 MG TOTAL) BY MOUTH DAILY. 30 capsule 3  . QUEtiapine Fumarate (SEROQUEL XR) 150 MG 24 hr tablet Take 1 tablet (150 mg total) by mouth at bedtime. 30 tablet 3  . tadalafil (CIALIS) 5 MG tablet Take 5 mg by mouth daily as needed.    . valACYclovir (VALTREX) 500 MG tablet TAKE 1 TABLET (500 MG TOTAL) BY MOUTH DAILY. 30 tablet 0   No current facility-administered medications for this visit.     Neurologic: Headache: Negative Seizure: Negative Paresthesias:Negative  Musculoskeletal: Strength & Muscle Tone: within normal limits Gait & Station: normal Patient leans: N/A  Psychiatric Specialty Exam: ROS  There were no vitals taken for this visit.There is no height or weight on file to calculate BMI.  General Appearance: Well Groomed  Eye Contact:  Good  Speech:  Clear and Coherent  Volume:  Normal  Mood:  Anxious  Affect:  Congruent  Thought Process:  Coherent and Goal Directed  Orientation:  Full (Time, Place, and Person)  Thought Content:  Logical  Suicidal Thoughts:  No  Homicidal Thoughts:  No  Memory:  Immediate;   Good Recent;   Good Remote;   Good  Judgement:  Good  Insight:  Fair  Psychomotor Activity:  Normal  Concentration:  Concentration: Good and Attention Span: Good  Recall:  Good  Fund of Knowledge:Good  Language: Good  Akathisia:  Negative  Handed:  Right  AIMS (if indicated):  0  Assets:  Communication Skills Desire for Improvement Financial Resources/Insurance Housing Intimacy Leisure Time Physical Health Resilience Social Support Talents/Skills Transportation Vocational/Educational  ADL's:  Intact  Cognition: WNL  Sleep:  adequate    Treatment Plan Summary: Admit to IOP with  daily group therapy   Donnelly Angelica, MD 8/9/20182:18 PM

## 2017-01-19 ENCOUNTER — Other Ambulatory Visit (HOSPITAL_COMMUNITY): Payer: BLUE CROSS/BLUE SHIELD | Attending: Psychiatry | Admitting: Psychiatry

## 2017-01-19 DIAGNOSIS — F3131 Bipolar disorder, current episode depressed, mild: Secondary | ICD-10-CM

## 2017-01-19 DIAGNOSIS — F411 Generalized anxiety disorder: Secondary | ICD-10-CM | POA: Insufficient documentation

## 2017-01-19 DIAGNOSIS — F329 Major depressive disorder, single episode, unspecified: Secondary | ICD-10-CM | POA: Insufficient documentation

## 2017-01-19 NOTE — Progress Notes (Signed)
    Daily Group Progress Note  Program: IOP  Group Time: 9:00-12:00  Participation Level: Active  Behavioral Response: Appropriate  Type of Therapy:  Group Therapy  Summary of Progress: Pt. Presents as calm, talkative, smiles appropriately. Pt. Discussed history of having trust issues and problems that lack of trust have cause in relationship with his girlfriend, and growing up in a chaotic household. Pt. Discussed his plans for a bike ride over the weekend. Pt. Participated in discussion about reducing feelings of judgment about self, resisting patterns of all-or-nothing thinking.     Nancie Neas, LPC

## 2017-01-22 ENCOUNTER — Other Ambulatory Visit (HOSPITAL_COMMUNITY): Payer: BLUE CROSS/BLUE SHIELD | Admitting: Psychiatry

## 2017-01-22 DIAGNOSIS — F411 Generalized anxiety disorder: Secondary | ICD-10-CM | POA: Diagnosis not present

## 2017-01-22 DIAGNOSIS — F329 Major depressive disorder, single episode, unspecified: Secondary | ICD-10-CM | POA: Diagnosis not present

## 2017-01-22 DIAGNOSIS — F3131 Bipolar disorder, current episode depressed, mild: Secondary | ICD-10-CM

## 2017-01-22 NOTE — Progress Notes (Signed)
    Daily Group Progress Note  Program: IOP  Group Time: 9:00-12:00  Participation Level: Active  Behavioral Response: Appropriate  Type of Therapy:  Group Therapy  Summary of Progress: Pt. Presents as calm, talkative, engaged in the group process. Pt. Continues to be generally supportive of other group members. Pt. Participated in medication management group with the pharmacist and stated that it was very helpful for him. Pt. Stated that he had a very good weekend and was able to manage his thoughts better this week by compartmentalizing his thoughts into "useful and effective" and "not useful and not supported by the fact".       Nancie Neas, LPC

## 2017-01-23 ENCOUNTER — Other Ambulatory Visit (HOSPITAL_COMMUNITY): Payer: BLUE CROSS/BLUE SHIELD | Admitting: Psychiatry

## 2017-01-23 DIAGNOSIS — F3131 Bipolar disorder, current episode depressed, mild: Secondary | ICD-10-CM

## 2017-01-23 DIAGNOSIS — F329 Major depressive disorder, single episode, unspecified: Secondary | ICD-10-CM | POA: Diagnosis not present

## 2017-01-23 DIAGNOSIS — F411 Generalized anxiety disorder: Secondary | ICD-10-CM | POA: Diagnosis not present

## 2017-01-23 NOTE — Progress Notes (Signed)
    Daily Group Progress Note  Program: IOP  Group Time: 9:00-12:00  Participation Level: Active  Behavioral Response: Appropriate  Type of Therapy:  Group Therapy  Summary of Progress: Pt. Presents as talkative, engaged in the group process. Pt. Reported that he was doing ok, and that he was "feeling pretty good". Pt. Participated in discussion about understanding intentional and unintentional cognitive models. Pt. Discussed situation with his girlfriend and brother-in-law that makes him feel powerless and angry. Pt. Participated in grief and loss group with the chaplain.     Nancie Neas, LPC

## 2017-01-24 ENCOUNTER — Telehealth: Payer: Self-pay | Admitting: *Deleted

## 2017-01-24 ENCOUNTER — Other Ambulatory Visit (HOSPITAL_COMMUNITY): Payer: BLUE CROSS/BLUE SHIELD | Admitting: Psychiatry

## 2017-01-24 DIAGNOSIS — F329 Major depressive disorder, single episode, unspecified: Secondary | ICD-10-CM | POA: Diagnosis not present

## 2017-01-24 DIAGNOSIS — F3131 Bipolar disorder, current episode depressed, mild: Secondary | ICD-10-CM

## 2017-01-24 DIAGNOSIS — F411 Generalized anxiety disorder: Secondary | ICD-10-CM | POA: Diagnosis not present

## 2017-01-24 MED ORDER — OMEPRAZOLE 40 MG PO CPDR: 40.0000 mg | DELAYED_RELEASE_CAPSULE | Freq: Every day | ORAL | 3 refills | Status: DC

## 2017-01-24 NOTE — Telephone Encounter (Signed)
Refill sent per [facility] refill protocol/SLS  

## 2017-01-25 ENCOUNTER — Other Ambulatory Visit (HOSPITAL_COMMUNITY): Payer: BLUE CROSS/BLUE SHIELD | Admitting: Psychiatry

## 2017-01-25 DIAGNOSIS — F3131 Bipolar disorder, current episode depressed, mild: Secondary | ICD-10-CM

## 2017-01-25 DIAGNOSIS — F411 Generalized anxiety disorder: Secondary | ICD-10-CM | POA: Diagnosis not present

## 2017-01-25 DIAGNOSIS — F329 Major depressive disorder, single episode, unspecified: Secondary | ICD-10-CM | POA: Diagnosis not present

## 2017-01-26 ENCOUNTER — Other Ambulatory Visit (HOSPITAL_COMMUNITY): Payer: BLUE CROSS/BLUE SHIELD | Admitting: Psychiatry

## 2017-01-26 DIAGNOSIS — F411 Generalized anxiety disorder: Secondary | ICD-10-CM | POA: Diagnosis not present

## 2017-01-26 DIAGNOSIS — F3131 Bipolar disorder, current episode depressed, mild: Secondary | ICD-10-CM

## 2017-01-26 DIAGNOSIS — F329 Major depressive disorder, single episode, unspecified: Secondary | ICD-10-CM | POA: Diagnosis not present

## 2017-01-26 NOTE — Progress Notes (Signed)
    Daily Group Progress Note  Program: IOP  Group Time: 9:00-12:00  Participation Level: Active  Behavioral Response: Appropriate  Type of Therapy:  Group Therapy  Summary of Progress: Pt. Presented with bright affect, talkative, engaged in the group process. Pt. 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 Counselor reviewed the grounding series and directed the group in a brief breath focused meditation to help with building distress tolerance. Pt. Indicated that it helped him to relax.     Nancie Neas, LPC

## 2017-01-26 NOTE — Progress Notes (Signed)
    Daily Group Progress Note  Program: IOP  Group Time: 9:00-12:00  Participation Level: Active  Behavioral Response: Appropriate  Type of Therapy:  Group Therapy  Summary of Progress: Pt. Continues to present as talkative, engaged in the group process. Pt. Participated in the second day of discussion of the cognitive models. Pt. Was able to identify his unintentional model of feeling thinking that there is nothing that there is nothing that he can do when his brother-in-law's behavior become's out of control which leaves him with a feeling of powerlessness. Pt. Was able to recognize that the feeling of powerlessness makes him feel  "down" and ultimately more "drained" and depressed. Pt. Was able to develop an intentional model with the thought "I am in control of the situation and I am not a victim to my brother in law".       Nancie Neas, LPC

## 2017-01-29 ENCOUNTER — Other Ambulatory Visit (HOSPITAL_COMMUNITY): Payer: BLUE CROSS/BLUE SHIELD | Admitting: Psychiatry

## 2017-01-29 DIAGNOSIS — F411 Generalized anxiety disorder: Secondary | ICD-10-CM | POA: Diagnosis not present

## 2017-01-29 DIAGNOSIS — F3131 Bipolar disorder, current episode depressed, mild: Secondary | ICD-10-CM

## 2017-01-29 DIAGNOSIS — F329 Major depressive disorder, single episode, unspecified: Secondary | ICD-10-CM | POA: Diagnosis not present

## 2017-01-29 NOTE — Progress Notes (Signed)
    Daily Group Progress Note  Program: IOP   Group Time: 9:00-12:00   Participation Level: Active   Behavioral Response:  Appropriate, Motivated   Type of Therapy:  Group Therapy   Summary of Progress: Pt. presented as bright and engaged.  Pt. asked for ideas around how to not react in anger in the moment of personal situations.  Counselor and group suggested strategies, such as taking time and space to process a situation before responding immediately.  Pt. watched Ted Talk about positive psychology and engaged in group discussion regarding incorporating healthy habits, such as gratitude, journaling, acts of kindness, exercise and meditation into daily life.  Counselor presented RAIN model of mindfulness.  Pt. engaged in discussion of the utility of emotions, like anger, and the importance of acknowledging those feelings.  Nancie Neas, LPC

## 2017-01-30 ENCOUNTER — Other Ambulatory Visit (HOSPITAL_COMMUNITY): Payer: BLUE CROSS/BLUE SHIELD | Admitting: Psychiatry

## 2017-01-30 DIAGNOSIS — F329 Major depressive disorder, single episode, unspecified: Secondary | ICD-10-CM | POA: Diagnosis not present

## 2017-01-30 DIAGNOSIS — F411 Generalized anxiety disorder: Secondary | ICD-10-CM | POA: Diagnosis not present

## 2017-01-30 DIAGNOSIS — F3131 Bipolar disorder, current episode depressed, mild: Secondary | ICD-10-CM

## 2017-01-30 NOTE — Progress Notes (Signed)
    Daily Group Progress Note  Program: IOP    Group Time: 9:00-12:00   Participation Level: Active   Behavioral Response:  Appropriate, Motivated   Type of Therapy:  Group Therapy   Summary of Progress: Pt presented as bright and engaged.  Pt actively listened to other group members and applied related issues to his own situation.  Pt shared that his girlfriend did not want to be "friends" with him on Facebook, which caused him to feel concerned about their relationship.  Pt asked for group feedback surrounding the issue and engaged in conversation about the benefits and disadvantages of social media.  Pt expressed that he and his girlfriend have gone back and forth in their relationship, perhaps in part because they were both hurt by previous partners.  Counselor shared passage related to mindfulness and letting things go that hold [place] back from moving forward.  Nancie Neas, LPC

## 2017-01-31 ENCOUNTER — Other Ambulatory Visit (HOSPITAL_COMMUNITY): Payer: BLUE CROSS/BLUE SHIELD | Admitting: Psychiatry

## 2017-01-31 DIAGNOSIS — F3131 Bipolar disorder, current episode depressed, mild: Secondary | ICD-10-CM

## 2017-01-31 DIAGNOSIS — F329 Major depressive disorder, single episode, unspecified: Secondary | ICD-10-CM | POA: Diagnosis not present

## 2017-01-31 DIAGNOSIS — F411 Generalized anxiety disorder: Secondary | ICD-10-CM | POA: Diagnosis not present

## 2017-02-01 ENCOUNTER — Other Ambulatory Visit (HOSPITAL_COMMUNITY): Payer: BLUE CROSS/BLUE SHIELD

## 2017-02-01 NOTE — Progress Notes (Signed)
    Daily Group Progress Note  Program: IOP  Group Time: 9:00-12:00   Participation Level: Active   Behavioral Response:  Appropriate, Motivated   Type of Therapy:  Group Therapy   Summary of Progress: Pt presented as bright and engaged.  Pt said he was debating whether he wanted to continue with IOP, as he has a few more days off of work.  Pt expressed that he has had a positive experience in group.  Counselor encouraged pt to consider the tools that he has learned and how he will continue to implement those moving forward-for example, by continuing with an individual therapist.  Pt said he will come back to group on August 30.  Pt listened to and engaged in presentation about nutrition, sleep and exercise provided by representative from [facility].  Nancie Neas, LPC

## 2017-02-01 NOTE — Progress Notes (Signed)
    Daily Group Progress Note  Program: IOP    Group Time: 9:00-12:00   Participation Level: Active   Behavioral Response:  Appropriate, Motivated   Type of Therapy:  Group Therapy   Summary of Progress: Pt presented as bright and engaged.  Pt. watched "The Power of Vulnerability" Ted Talk by Jerelyn Scott, and engaged in group discussion about vulnerability.  Pt shared that one of his brothers killed pt's sister-in-law and another one of his brothers killed himself.  Counselor acknowledged pt's history of traumatic experiences and affirmed client's resilience and strength in showing emotion.  Counselor provided psychoeducation around emotions-that emotions are neither "good" or "bad," but rather they are indicators and signs of what is going on in our lives.  Nancie Neas, LPC

## 2017-02-02 ENCOUNTER — Other Ambulatory Visit (HOSPITAL_COMMUNITY): Payer: BLUE CROSS/BLUE SHIELD

## 2017-02-05 ENCOUNTER — Other Ambulatory Visit (HOSPITAL_COMMUNITY): Payer: BLUE CROSS/BLUE SHIELD

## 2017-02-06 ENCOUNTER — Other Ambulatory Visit (HOSPITAL_COMMUNITY): Payer: BLUE CROSS/BLUE SHIELD

## 2017-02-07 ENCOUNTER — Other Ambulatory Visit (HOSPITAL_COMMUNITY): Payer: BLUE CROSS/BLUE SHIELD | Admitting: Psychiatry

## 2017-02-07 DIAGNOSIS — F329 Major depressive disorder, single episode, unspecified: Secondary | ICD-10-CM | POA: Diagnosis not present

## 2017-02-07 DIAGNOSIS — F411 Generalized anxiety disorder: Secondary | ICD-10-CM | POA: Diagnosis not present

## 2017-02-07 DIAGNOSIS — F3131 Bipolar disorder, current episode depressed, mild: Secondary | ICD-10-CM

## 2017-02-07 NOTE — Progress Notes (Signed)
Francisco Lopez is a 56 y.o. , divorced, employed, Caucasian male who was referred per Pennelope Bracken, LCSW; treatment for worsening anxiety symptoms; with distorted thinking.  Continues to deny SI/HI or A/V hallucinations.  Stressor:  1) Conflictual relationships:  According to pt his distorted thinking is affecting his relationship of yrs with his girlfriend.  Pt has trust issues steming from his childhood.  States he continues to have mistrust issues in all his relationships.  Reports he has the tendency to overanalyze things and the other person gets very frustrated with him until it causes a lot of problems.  No prior inpt psychiatric hospitalizations.  Denied any suicide attempts or gestures.  Currently under the services of Dr. Toy Care and Pennelope Bracken, LCSW.  Family Hx:  Deceased Father (ETOH) and Deceased Mother (Anxiety).  Pt successfully completed MH-IOP today.  Pt returned to work on 02-01-17; states he is glad to be back at work.  Reports he met his goal to learn ways to cope with his relationship issues and putting his distorted thoughts in check.  Pt is exploring the fact that he and girlfriend may not be compatible.  Pt states that having a network of friends has helped. Scored 7 on PHQ-9 on admission and 0 on discharge.  A:  D/C today.  F/U with Dr. Toy Care and Pennelope Bracken, LCSW.  Encouraged support groups.  R:  Pt receptive.                                                                                             Francisco Lopez, RITA, M.Ed, CNA

## 2017-02-07 NOTE — Patient Instructions (Signed)
D:  Pt successfully completed MH-IOP today.  A:  Follow up with Dr. Toy Care and Pennelope Bracken, LCSW.  Encouraged support.  R:  Pt receptive.

## 2017-02-07 NOTE — Progress Notes (Signed)
Patient ID: Francisco Lopez, male   DOB: 1961-01-28, 56 y.o.   MRN: [id number]  [facility] IOP DISCHARGE NOTE  Patient:  Francisco Lopez DOB:  [birth year]  Date of Admission: 01/18/2017  Date of Discharge: 02/07/2017  Reason for Admission:anxiety and depression  IOP Course:attended and participated.  Made good progress.  Anxiety and depression both decreased significantly  Mental Status at Discharge:no suicidal ideation and optimistic about his future  Diagnosis: generalized anxiety disorder  Level of Care:  IOP  Discharge destination: has appointments with therapist and provider     Comments:  Very motivated to change and used the treatment well  The patient received suicide prevention pamphlet:  Yes   Donnelly Angelica, MD

## 2017-02-08 ENCOUNTER — Other Ambulatory Visit (HOSPITAL_COMMUNITY): Payer: BLUE CROSS/BLUE SHIELD

## 2017-02-13 NOTE — Progress Notes (Signed)
    Daily Group Progress Note  Program: IOP  Group Time: 9:00-12:00   Participation Level: Active   Behavioral Response: Appropriate   Type of Therapy:  Group Therapy   Summary of Progress: Pt presented as quiet but engaged.  Counselors and group members offered pt words of affirmation and gratitude, as it was pt's last day in IOP.  Pt said that the group benefited him greatly and expressed gratitude for counselors and other members.  Nancie Neas, LPC

## 2017-02-14 ENCOUNTER — Other Ambulatory Visit: Payer: Self-pay | Admitting: Family

## 2017-02-16 ENCOUNTER — Telehealth: Payer: Self-pay | Admitting: Family

## 2017-02-16 ENCOUNTER — Encounter: Payer: Self-pay | Admitting: Family

## 2017-02-16 ENCOUNTER — Ambulatory Visit (INDEPENDENT_AMBULATORY_CARE_PROVIDER_SITE_OTHER): Payer: BLUE CROSS/BLUE SHIELD | Admitting: Family

## 2017-02-16 VITALS — BP 121/66 | HR 80 | Temp 98.7°F | Ht 71.0 in | Wt 168.0 lb

## 2017-02-16 DIAGNOSIS — M25512 Pain in left shoulder: Secondary | ICD-10-CM

## 2017-02-16 DIAGNOSIS — Z23 Encounter for immunization: Secondary | ICD-10-CM

## 2017-02-16 DIAGNOSIS — F319 Bipolar disorder, unspecified: Secondary | ICD-10-CM | POA: Diagnosis not present

## 2017-02-16 MED ORDER — MELOXICAM 7.5 MG PO TABS: 7.5000 mg | ORAL_TABLET | Freq: Every day | ORAL | 0 refills | Status: DC

## 2017-02-16 NOTE — Progress Notes (Signed)
Subjective:    Patient ID: Francisco Lopez, male    DOB: 1961-04-18, 56 y.o.   MRN: [id number]  HPI  Francisco Lopez is a 56 yr old male who presents today with chief complaint of shoulder pain.  Reports that he developed a left shoulder ache x 6 weeks. Reports that the past 1 week he has had more pain, "like toothache in my arm."Thinks that it occurred while pulling hoses at work." Reports that ibuprofen helped at first, but no longer helping.   Seeing Dr. Toy Care- reports that he weaned off of seroquel.  "I had a bad episode."  He was placed on depakote and was placed back on seroquel.  Reports mood is good on this combination.  Mind is not racing.    Review of Systems See HPI  Past Medical History:  Diagnosis Date  . Anxiety   . Basal cell carcinoma 2006  . Depression   . Diastolic dysfunction 1/44/3154   Per 2D echo 8/16  . Herpes simplex type 1 antibody positive 01/28/2015  . Herpes simplex type 2 infection 01/28/2015  . History of chicken pox   . Hyperlipidemia   . Hypertension      Social History   Social History  . Marital status: Legally Separated    Spouse name: N/A  . Number of children: N/A  . Years of education: N/A   Occupational History  . Not on file.   Social History Main Topics  . Smoking status: Never Smoker  . Smokeless tobacco: Never Used  . Alcohol use No  . Drug use: No  . Sexual activity: Yes   Other Topics Concern  . 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 KNEE SURGERY Left 2000   meninscus repair    Family History  Problem Relation Age of Onset  . Diabetes Mother        type II  . Anxiety disorder Mother   . Hypertension Father   . Alcohol abuse Father     No Known Allergies  Current Outpatient Prescriptions on File Prior to Visit  Medication Sig Dispense  Refill  . amlodipine-atorvastatin (CADUET) 10-20 MG tablet TAKE 1 TABLET BY MOUTH EVERY DAY 90 tablet 0  . fenofibrate (TRICOR) 145 MG tablet TAKE 1 TABLET (145 MG TOTAL) BY MOUTH DAILY. 30 tablet 5  . fluticasone (FLONASE) 50 MCG/ACT nasal spray PLACE 2 SPRAYS INTO BOTH NOSTRILS DAILY. 16 g 1  . Krill Oil 1000 MG CAPS Take by mouth.    . Multiple Vitamins-Minerals (MENS MULTIVITAMIN PLUS PO) Take by mouth.    Marland Kitchen omeprazole (PRILOSEC) 40 MG capsule Take 1 capsule (40 mg total) by mouth daily. 30 capsule 3  . QUEtiapine Fumarate (SEROQUEL XR) 150 MG 24 hr tablet Take 1 tablet (150 mg total) by mouth at bedtime. 30 tablet 3  . tadalafil (CIALIS) 5 MG tablet Take 5 mg by mouth daily as needed.    . valACYclovir (VALTREX) 500 MG tablet TAKE 1 TABLET (500 MG TOTAL) BY MOUTH DAILY. 30 tablet 5   No current facility-administered medications on file prior to visit.     BP 121/66   Pulse 80   Temp 98.7 F (37.1 C) (Oral)   Ht 5\' 11"  (1.803 m)   Wt  168 lb (76.2 kg)   SpO2 99%   BMI 23.43 kg/m       Objective:   Physical Exam  Constitutional: He is oriented to person, place, and time. He appears well-developed and well-nourished. No distress.  HENT:  Head: Normocephalic and atraumatic.  Cardiovascular: Normal rate and regular rhythm.   No murmur heard. Pulmonary/Chest: Effort normal and breath sounds normal. No respiratory distress. He has no wheezes. He has no rales.  Musculoskeletal: He exhibits no edema.  Left shoulder is without swelling or tenderness to palpation. Unable to fully extend left shoulder.  + pain left shoulder with "empty can"  Neurological: He is alert and oriented to person, place, and time.  Skin: Skin is warm and dry.  Psychiatric: He has a normal mood and affect. His behavior is normal. Thought content normal.          Assessment & Plan:  Left shoulder pain- suspect rotator cuff strain. New.  Will rx with meloxicam once daily and refer to sports medicine for  further evaluation.   Bipolar disorder- currently stable. Following with psychiatry and has started some counseling which he is finding very helpful.

## 2017-02-16 NOTE — Telephone Encounter (Signed)
Pt says that his AVS states that he got his flu shot, he said that he didn't actually get it. He would just like to be sure that the his chart is reflecting correctly.

## 2017-02-16 NOTE — Patient Instructions (Signed)
Please begin meloxicam (anti-inflammatory) once daily. You will be contacted about your referral to Sports medicine.

## 2017-02-16 NOTE — Telephone Encounter (Signed)
Called Pt back to ask him if he'd like to return to the office. I ordered his vaccine however I wasn't able to administer during his visit.

## 2017-02-19 ENCOUNTER — Telehealth: Payer: Self-pay | Admitting: Family Medicine

## 2017-02-19 ENCOUNTER — Encounter: Payer: Self-pay | Admitting: *Deleted

## 2017-02-19 ENCOUNTER — Ambulatory Visit (INDEPENDENT_AMBULATORY_CARE_PROVIDER_SITE_OTHER): Payer: BLUE CROSS/BLUE SHIELD | Admitting: Family Medicine

## 2017-02-19 ENCOUNTER — Encounter: Payer: Self-pay | Admitting: Family Medicine

## 2017-02-19 DIAGNOSIS — M25512 Pain in left shoulder: Secondary | ICD-10-CM | POA: Diagnosis not present

## 2017-02-19 MED ORDER — METHYLPREDNISOLONE ACETATE 40 MG/ML IJ SUSP
40.0000 mg | Freq: Once | INTRAMUSCULAR | Status: AC
  Administered 2017-02-19: 40 mg via INTRA_ARTICULAR

## 2017-02-19 NOTE — Telephone Encounter (Signed)
Does it need to have the specific date?  The letter we wrote says he can return to work full duty without restrictions in 1 week.

## 2017-02-19 NOTE — Patient Instructions (Signed)
You have rotator cuff impingement Try to avoid painful activities (overhead activities, lifting with extended arm) as much as possible. Meloxicam 15mg  daily with food for pain and inflammation. Can take tylenol in addition to this. Subacromial injection may be beneficial to help with pain and to decrease inflammation - you were given this today. Start physical therapy with transition to home exercise program. Do home exercise program with theraband and scapular stabilization exercises daily 3 sets of 10 once a day. If not improving at follow-up we will consider further imaging, injection, physical therapy, and/or nitro patches. Follow up with me in 6 weeks (call sooner if you're struggling though).

## 2017-02-19 NOTE — Telephone Encounter (Signed)
Letter written

## 2017-02-19 NOTE — Telephone Encounter (Signed)
Patient states his company does not have light duty work so he will be out of work for a week. Patient will need a letter stating he can return to work with no restrictions on 9/18

## 2017-02-20 DIAGNOSIS — M25512 Pain in left shoulder: Secondary | ICD-10-CM

## 2017-02-20 HISTORY — DX: Pain in left shoulder: M25.512

## 2017-02-20 NOTE — Progress Notes (Signed)
PCP and consultation requested by: [name], NP  Subjective:   HPI: Patient is a 56 y.o. male here for left shoulder pain.  Patient reports about 1 1/2 months ago he started to get pain in lateral left shoulder. No acute injury or trauma. 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 While doing this he gets worse pain in lateral left shoulder. Pain level up to 5/10 and like a toothache but sharp with reaching, overhead motion. Taking meloxicam from PCP which has helped some. No skin changes, numbness.  Past Medical History:  Diagnosis Date  . Anxiety   . Basal cell carcinoma 2006  . Depression   . Diastolic dysfunction 9/93/7169   Per 2D echo 8/16  . Herpes simplex type 1 antibody positive 01/28/2015  . Herpes simplex type 2 infection 01/28/2015  . History of chicken pox   . Hyperlipidemia   . Hypertension     Current Outpatient Prescriptions on File Prior to Visit  Medication Sig Dispense Refill  . amlodipine-atorvastatin (CADUET) 10-20 MG tablet TAKE 1 TABLET BY MOUTH EVERY DAY 90 tablet 0  . Divalproex Sodium (DEPAKOTE PO) Take by mouth.    . fenofibrate (TRICOR) 145 MG tablet TAKE 1 TABLET (145 MG TOTAL) BY MOUTH DAILY. 30 tablet 5  . fluticasone (FLONASE) 50 MCG/ACT nasal spray PLACE 2 SPRAYS INTO BOTH NOSTRILS DAILY. 16 g 1  . Krill Oil 1000 MG CAPS Take by mouth.    . meloxicam (MOBIC) 7.5 MG tablet Take 1 tablet (7.5 mg total) by mouth daily. 14 tablet 0  . Multiple Vitamins-Minerals (MENS MULTIVITAMIN PLUS PO) Take by mouth.    Marland Kitchen omeprazole (PRILOSEC) 40 MG capsule Take 1 capsule (40 mg total) by mouth daily. 30 capsule 3  . QUEtiapine Fumarate (SEROQUEL XR) 150 MG 24 hr tablet Take 1 tablet (150 mg total) by mouth at bedtime. 30 tablet 3  . tadalafil (CIALIS) 5 MG tablet Take 5 mg by mouth daily as needed.    . valACYclovir (VALTREX) 500 MG tablet TAKE 1 TABLET (500 MG TOTAL) BY MOUTH DAILY. 30 tablet 5   No current  facility-administered medications on file prior to visit.     Past Surgical History:  Procedure Laterality Date  . KNEE SURGERY Left 2000   meninscus repair    No Known Allergies  Social History   Social History  . Marital status: Legally Separated    Spouse name: N/A  . Number of children: N/A  . Years of education: N/A   Occupational History  . Not on file.   Social History Main Topics  . Smoking status: Never Smoker  . Smokeless tobacco: Never Used  . Alcohol use No  . Drug use: No  . Sexual activity: Yes   Other Topics Concern  . 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 Diabetes Mother        type II  . Anxiety disorder Mother   . Hypertension Father   . Alcohol abuse Father     BP 126/89   Pulse (!) 114   Ht 5\' 11"  (1.803 m)   Wt 168 lb (76.2 kg)   BMI 23.43 kg/m   Review of Systems: See HPI above.  Objective:  Physical Exam:  Gen: NAD, comfortable in exam room  Left shoulder: No swelling, ecchymoses.  No gross deformity. No TTP. FROM with painful arc. Positive Hawkins, Neers. Negative Yergasons. Strength 5/5 with empty can and resisted internal/external rotation.  Pain empty can. Negative apprehension. NV intact distally.  Right shoulder: No swelling, ecchymoses.  No gross deformity. No TTP. FROM. Strength 5/5 with empty can and resisted internal/external rotation. NV intact distally.   Assessment & Plan:  1. Left shoulder pain - 2/2 rotator cuff impingement.  Subacromial injection given today.  Meloxicam for pain and inflammation.  Start physical therapy and home exercises.  F/u in 6 weeks.  Consider imaging, physical therapy, nitro patches if not improving.  After informed written consent timeout was performed, patient was seated on exam table. Left  shoulder was prepped with alcohol swab and utilizing posterior approach, patient's left subacromial space was injected with 3:1 bupivicaine: depomedrol. Patient tolerated the procedure well without immediate complications.

## 2017-02-20 NOTE — Assessment & Plan Note (Signed)
2/2 rotator cuff impingement.  Subacromial injection given today.  Meloxicam for pain and inflammation.  Start physical therapy and home exercises.  F/u in 6 weeks.  Consider imaging, physical therapy, nitro patches if not improving.  After informed written consent timeout was performed, patient was seated on exam table. Left shoulder was prepped with alcohol swab and utilizing posterior approach, patient's left subacromial space was injected with 3:1 bupivicaine: depomedrol. Patient tolerated the procedure well without immediate complications.

## 2017-02-28 ENCOUNTER — Ambulatory Visit: Payer: BLUE CROSS/BLUE SHIELD | Attending: Family Medicine | Admitting: Physical Therapy

## 2017-02-28 ENCOUNTER — Ambulatory Visit (INDEPENDENT_AMBULATORY_CARE_PROVIDER_SITE_OTHER): Payer: BLUE CROSS/BLUE SHIELD

## 2017-02-28 DIAGNOSIS — R293 Abnormal posture: Secondary | ICD-10-CM | POA: Insufficient documentation

## 2017-02-28 DIAGNOSIS — R29898 Other symptoms and signs involving the musculoskeletal system: Secondary | ICD-10-CM | POA: Insufficient documentation

## 2017-02-28 DIAGNOSIS — M25512 Pain in left shoulder: Secondary | ICD-10-CM

## 2017-02-28 DIAGNOSIS — Z23 Encounter for immunization: Secondary | ICD-10-CM

## 2017-02-28 NOTE — Patient Instructions (Signed)
Cane Exercise: Flexion   Lie on back, holding cane above chest. Keeping arms as straight as possible, lower cane toward floor beyond head. Hold __5-10__ seconds. Repeat __10-15__ times. Do __1-2__ sessions per day.  Cane Exercise: Abduction   Hold cane with right hand over end, palm-up, with other hand palm-down. Move arm out from side and up by pushing with other arm. Hold __5-10__ seconds. Repeat __10-15__ times. Do _1-2___ sessions per day.  Scapular Retraction (Standing)   With arms at sides, pinch shoulder blades together. Repeat __15__ times per set. Do __2__ sets per session. Do __2-3__ sessions per day.  Resistive Band Rowing   With resistive band anchored in door, grasp both ends. Keeping elbows bent, pull back, squeezing shoulder blades together. Hold __5__ seconds. Repeat _15___ times. Do __2__ sessions per day.  Resisted Horizontal Abduction: Bilateral   Sit or stand, tubing in both hands, arms out in front. Keeping arms straight, pinch shoulder blades together and stretch arms out. Repeat _10-15___ times per set. Do _2___ sets per session.   Resisted External Rotation: in Neutral - Bilateral   Sit or stand, tubing in both hands, elbows at sides, bent to 90, forearms forward. Pinch shoulder blades together and rotate forearms out. Keep elbows at sides. Repeat __15__ times per set. Do _2___ sets per session.

## 2017-02-28 NOTE — Progress Notes (Signed)
Pre visit review using our clinic tool,if applicable. No additional management support is needed unless otherwise documented below in the visit note.  Patient in for Influenza vaccination per order from M. O'sullivan.  Given 0.5 ml  Fluvarix Quadrivalent IM Right deltoid.  Patient tolerated well.

## 2017-02-28 NOTE — Therapy (Signed)
[facility] [address]  [place], [place], [address] Phone: [phone]   Fax:  [phone]  Physical Therapy Treatment  Patient Details  Name: Francisco Lopez MRN: [id number] Date of Birth: 1961-04-15 Referring Provider: Dr. Karlton Lemon  Encounter Date: 02/28/2017      PT End of Session - 02/28/17 1524    Visit Number 1   Number of Visits 12   Date for PT Re-Evaluation 04/11/17   PT Start Time 6962   PT Stop Time 1524   PT Time Calculation (min) 39 min   Activity Tolerance Patient tolerated treatment well   Behavior During Therapy [name] for tasks assessed/performed      Past Medical History:  Diagnosis Date  . Anxiety   . Basal cell carcinoma 2006  . Depression   . Diastolic dysfunction 9/52/8413   Per 2D echo 8/16  . Herpes simplex type 1 antibody positive 01/28/2015  . Herpes simplex type 2 infection 01/28/2015  . History of chicken pox   . Hyperlipidemia   . Hypertension     Past Surgical History:  Procedure Laterality Date  . KNEE SURGERY Left 2000   meninscus repair    There were no vitals filed for this visit.      Subjective Assessment - 02/28/17 1445    Subjective Saw Dr. Barbaraann Barthel - has been having L shoulder pain for approx 1.5 months - releates pain to a toothache. Report dx of impingement. Had injection - pain is lessened, and has been exercises from MD. Pain is improved, but not 100%. Currently can not reach overhead, sleeping on L shoulder, as well as pronation with arm extended. 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 Currently out of work.   Currently in Pain? Yes   Pain Score 8   with overhead movements   Pain Location Shoulder   Pain Orientation Left   Pain Descriptors / Indicators Dull   Pain Type Acute pain   Pain Onset More than a month ago   Pain Frequency Intermittent   Aggravating Factors  overhead movements, sleeping on L shoulder   Pain  Relieving Factors tylenol            [facility] PT Assessment - 02/28/17 1442      Assessment   Medical Diagnosis L shoulder pain   Referring Provider Dr. Karlton Lemon   Onset Date/Surgical Date 01/07/17   Hand Dominance Right   Next MD Visit 03/26/17   Prior Therapy no     Precautions   Precautions None     Restrictions   Weight Bearing Restrictions No     Balance Screen   Has the patient fallen in the past 6 months No   Has the patient had a decrease in activity level because of a fear of falling?  No   Is the patient reluctant to leave their home because of a fear of falling?  No     Home Ec[employer] residence     Prior Function   Level of Independence Independent   Vocation Full time employment   Vocation Requirements truck driver     Cognition   Overall Cognitive Status Within Functional Limits for tasks assessed     Observation/Other Assessments   Focus on Therapeutic Outcomes (FOTO)  Shoulder: 36 (64% limited, predicted 34% limited)     Sensation   Light Touch Appears Intact  Coordination   Gross Motor Movements are Fluid and Coordinated Yes     Posture/Postural Control   Posture/Postural Control Postural limitations   Postural Limitations Rounded Shoulders;Forward head     ROM / Strength   AROM / PROM / Strength AROM;Strength;PROM     AROM   AROM Assessment Site Shoulder   Right/Left Shoulder Right;Left   Left Shoulder Flexion 134 Degrees   Left Shoulder ABduction 130 Degrees   Left Shoulder Internal Rotation --  FIR to ~iliac crest   Left Shoulder External Rotation --  FER to ~C4     PROM   PROM Assessment Site Shoulder   Right/Left Shoulder Left   Left Shoulder Flexion 160 Degrees   Left Shoulder ABduction 150 Degrees   Left Shoulder Internal Rotation 50 Degrees   Left Shoulder External Rotation 90 Degrees     Strength   Strength Assessment Site Shoulder;Elbow   Right/Left Shoulder Right;Left   Right  Shoulder Flexion 4+/5   Right Shoulder ABduction 5/5   Right Shoulder Internal Rotation 4+/5   Right Shoulder External Rotation 4+/5   Left Shoulder Flexion 3+/5   Left Shoulder ABduction 4-/5   Left Shoulder Internal Rotation 4-/5   Left Shoulder External Rotation 4/5   Right/Left Elbow Right;Left   Right Elbow Flexion 5/5   Left Elbow Flexion 5/5     Palpation   Palpation comment tenderness to [name] joint line, L UT                     [facility] Adult PT Treatment/Exercise - 02/28/17 1442      Exercises   Exercises Shoulder     Shoulder Exercises: Supine   Flexion AAROM;Left;10 reps   Flexion Limitations cane   ABduction AAROM;Left;10 reps   ABduction Limitations cane     Shoulder Exercises: Seated   Retraction Both;15 reps   Retraction Limitations 5 sec hold     Shoulder Exercises: Standing   Horizontal ABduction Strengthening;Both;15 reps;Theraband   Theraband Level (Shoulder Horizontal ABduction) Level 2 (Red)   Horizontal ABduction Limitations with scap squeeze   External Rotation Strengthening;Both;Theraband;10 reps   Theraband Level (Shoulder External Rotation) Level 2 (Red)   External Rotation Limitations with scap squeeze   Row Strengthening;Both;15 reps;Theraband   Theraband Level (Shoulder Row) Level 2 (Red)                PT Education - 02/28/17 1523    Education provided Yes   Education Details exam findings, POC, HEP   Person(s) Educated Patient   Methods Explanation;Demonstration   Comprehension Verbalized understanding;Returned demonstration             PT Long Term Goals - 02/28/17 1442      PT LONG TERM GOAL #1   Title patient to be independent with advanced HEP   Status New   Target Date 04/11/17     PT LONG TERM GOAL #2   Title patient to improve L shoulder AROM equal to that of  R shoulder without pain limiting.   Status New   Target Date 04/11/17     PT LONG TERM GOAL #3   Title patient to demonstrate good  posture and body mechanics as applicable to daily activities   Status New   Target Date 04/11/17     PT LONG TERM GOAL #4   Title patient to improve L UE strength to >/= 4+/5 without pain   Status New   Target Date 04/11/17  PT LONG TERM GOAL #5   Title patient to report ability to perform job duties wihtout limitations from pain   Status New   Target Date 04/11/17               Plan - 02/28/17 1441    Clinical Impression Statement Patient is a 56 y/o male presenting to [place] today regarding primary complaints of L shoulder pain. Patient states pain has been persistent for approx 1.5 months wiht pain aggravated by work duties such as pulling/pushing hose. Patient today with limited mobility of L UE with pain at end ranges of all available motion. Patient with impingement type symptoms at L shoulder with pain at [facility] joint, as well. Patient given initial HEP for gentle stretching and strengthening today with good carryover. Patient to benefit from PT to address functional mobility and use of L UE to allow for return to work duties with reduced risk of re-injury.    Clinical Presentation Stable   Clinical Decision Making Low   Rehab Potential Good   PT Frequency 2x / week   PT Duration 6 weeks   PT Treatment/Interventions ADLs/Self Care Home Management;Cryotherapy;Electrical Stimulation;Iontophoresis 4mg /ml Dexamethasone;Moist Heat;Ultrasound;Neuromuscular re-education;Therapeutic exercise;Therapeutic activities;Patient/family education;Manual techniques;Passive range of motion;Vasopneumatic Device;Taping;Dry needling   Consulted and Agree with Plan of Care Patient      Patient will benefit from skilled therapeutic intervention in order to improve the following deficits and impairments:  Decreased activity tolerance, Decreased range of motion, Decreased strength, Pain, Postural dysfunction  Visit Diagnosis: Acute pain of left shoulder  Abnormal posture  Other symptoms and  signs involving the musculoskeletal system     Problem List Patient Active Problem List   Diagnosis Date Noted  . Left shoulder pain 02/20/2017  . Bipolar disorder ([place]) 08/25/2016  . Chronic rhinitis 07/24/2016  . Carpal tunnel syndrome 02/07/2016  . GERD (gastroesophageal reflux disease) 12/27/2015  . History of obstructive sleep apnea 10/29/2015  . Diastolic dysfunction 79/07/4095  . Herpes simplex type 1 antibody positive 01/28/2015  . Herpes simplex type 2 infection 01/28/2015  . Benign prostatic hyperplasia with urinary obstruction 10/19/2014  . Preventative health care 09/16/2014  . Erectile dysfunction 09/16/2014  . Depression 08/05/2014  . HTN (hypertension) 08/05/2014  . Hyperlipidemia 08/05/2014     Lanney Gins, PT, DPT 02/28/17 3:43 PM   [facility] [address]  [place], [place], [address] Phone: [phone]   Fax:  [phone]  Name: Francisco Lopez MRN: [id number] Date of Birth: 04/28/1961

## 2017-03-12 ENCOUNTER — Ambulatory Visit: Payer: BLUE CROSS/BLUE SHIELD | Attending: Family Medicine | Admitting: Physical Therapy

## 2017-03-12 DIAGNOSIS — M25512 Pain in left shoulder: Secondary | ICD-10-CM | POA: Insufficient documentation

## 2017-03-12 DIAGNOSIS — R29898 Other symptoms and signs involving the musculoskeletal system: Secondary | ICD-10-CM | POA: Diagnosis not present

## 2017-03-12 DIAGNOSIS — R293 Abnormal posture: Secondary | ICD-10-CM | POA: Insufficient documentation

## 2017-03-12 NOTE — Patient Instructions (Signed)

## 2017-03-12 NOTE — Therapy (Signed)
[facility] [address]  [place], [place], [address] Phone: [phone]   Fax:  [phone]  Physical Therapy Treatment  Patient Details  Name: Francisco Lopez MRN: [id number] Date of Birth: Dec 18, 1960 Referring Provider: Dr. Karlton Lemon  Encounter Date: 03/12/2017      PT End of Session - 03/12/17 1403    Visit Number 2   Number of Visits 12   Date for PT Re-Evaluation 04/11/17   PT Start Time 4825   PT Stop Time 1438   PT Time Calculation (min) 40 min   Activity Tolerance Patient tolerated treatment well   Behavior During Therapy [facility] for tasks assessed/performed      Past Medical History:  Diagnosis Date  . Anxiety   . Basal cell carcinoma 2006  . Depression   . Diastolic dysfunction 0/08/7046   Per 2D echo 8/16  . Herpes simplex type 1 antibody positive 01/28/2015  . Herpes simplex type 2 infection 01/28/2015  . History of chicken pox   . Hyperlipidemia   . Hypertension     Past Surgical History:  Procedure Laterality Date  . KNEE SURGERY Left 2000   meninscus repair    There were no vitals filed for this visit.      Subjective Assessment - 03/12/17 1401    Subjective "some days are good and some are bad" - certain motions make it worse   Patient Stated Goals improve function, work without pain   Currently in Pain? Yes   Pain Score 4   8-9/10 this morning   Pain Location Shoulder   Pain Orientation Left   Pain Descriptors / Indicators Aching;Dull   Pain Type Acute pain                         [facility] Adult PT Treatment/Exercise - 03/12/17 0001      Shoulder Exercises: ROM/Strengthening   UBE (Upper Arm Bike) L2 x 6 min (3/3)   Cybex Row 15 reps   Cybex Row Limitations 20# - narrow grip   Wall Pushups 15 reps   Pushups Limitations 2 sets; 2nd set with orange pball against wall   Other ROM/Strengthening Exercises BATCA pulldown - 15# x 15 reps     Shoulder Exercises:  Stretch   Other Shoulder Stretches door pec strtech - mid only - 30 sec x 3     Manual Therapy   Manual Therapy Soft tissue mobilization;Passive ROM   Manual therapy comments patient supine   Soft tissue mobilization STM to L: deltoid, UT, biceps origin, lats, pecs   Passive ROM PROM into flexion, abduction, and ER - good motion, near full with pain only when guarding                     PT Long Term Goals - 03/12/17 1606      PT LONG TERM GOAL #1   Title patient to be independent with advanced HEP   Status On-going     PT LONG TERM GOAL #2   Title patient to improve L shoulder AROM equal to that of  R shoulder without pain limiting.   Status On-going     PT LONG TERM GOAL #3   Title patient to demonstrate good posture and body mechanics as applicable to daily activities   Status On-going     PT LONG TERM GOAL #4   Title patient to improve L UE strength  to >/= 4+/5 without pain   Status On-going     PT LONG TERM GOAL #5   Title patient to report ability to perform job duties wihtout limitations from pain   Status On-going               Plan - 03/12/17 1606    Clinical Impression Statement patient doing well today - noting some days are completely pain free, however, others are bothersome. Has not identified many motions with work that make pain worse other then pushing without outstretched arm and reaching for seatebelt with L UE. Patient doing well with all STM and strengthening with no issue. Will assess response to ionto at next visit.    PT Treatment/Interventions ADLs/Self Care Home Management;Cryotherapy;Electrical Stimulation;Iontophoresis 4mg /ml Dexamethasone;Moist Heat;Ultrasound;Neuromuscular re-education;Therapeutic exercise;Therapeutic activities;Patient/family education;Manual techniques;Passive range of motion;Vasopneumatic Device;Taping;Dry needling   Consulted and Agree with Plan of Care Patient      Patient will benefit from skilled  therapeutic intervention in order to improve the following deficits and impairments:  Decreased activity tolerance, Decreased range of motion, Decreased strength, Pain, Postural dysfunction  Visit Diagnosis: Acute pain of left shoulder  Abnormal posture  Other symptoms and signs involving the musculoskeletal system     Problem List Patient Active Problem List   Diagnosis Date Noted  . Left shoulder pain 02/20/2017  . Bipolar disorder ([place]) 08/25/2016  . Chronic rhinitis 07/24/2016  . Carpal tunnel syndrome 02/07/2016  . GERD (gastroesophageal reflux disease) 12/27/2015  . History of obstructive sleep apnea 10/29/2015  . Diastolic dysfunction 35/57/3220  . Herpes simplex type 1 antibody positive 01/28/2015  . Herpes simplex type 2 infection 01/28/2015  . Benign prostatic hyperplasia with urinary obstruction 10/19/2014  . Preventative health care 09/16/2014  . Erectile dysfunction 09/16/2014  . Depression 08/05/2014  . HTN (hypertension) 08/05/2014  . Hyperlipidemia 08/05/2014    Lanney Gins, PT, DPT 03/12/17 4:08 PM   [facility] [address]  [place], [place], [address] Phone: [phone]   Fax:  [phone]  Name: Drew Lips MRN: [id number] Date of Birth: February 02, 1961

## 2017-03-15 ENCOUNTER — Ambulatory Visit: Payer: BLUE CROSS/BLUE SHIELD

## 2017-03-15 DIAGNOSIS — R293 Abnormal posture: Secondary | ICD-10-CM | POA: Diagnosis not present

## 2017-03-15 DIAGNOSIS — R29898 Other symptoms and signs involving the musculoskeletal system: Secondary | ICD-10-CM

## 2017-03-15 DIAGNOSIS — M25512 Pain in left shoulder: Secondary | ICD-10-CM | POA: Diagnosis not present

## 2017-03-15 NOTE — Therapy (Signed)
[facility] [address]  [place], [place], [address] Phone: [phone]   Fax:  [phone]  Physical Therapy Treatment  Patient Details  Name: Francisco Lopez MRN: [id number] Date of Birth: 08/29/1960 Referring Provider: Dr. Karlton Lemon  Encounter Date: 03/15/2017      PT End of Session - 03/15/17 1709    Visit Number 3   Number of Visits 12   Date for PT Re-Evaluation 04/11/17   PT Start Time 1703   PT Stop Time 1748   PT Time Calculation (min) 45 min   Activity Tolerance Patient tolerated treatment well   Behavior During Therapy [facility] for tasks assessed/performed      Past Medical History:  Diagnosis Date  . Anxiety   . Basal cell carcinoma 2006  . Depression   . Diastolic dysfunction 5/99/3570   Per 2D echo 8/16  . Herpes simplex type 1 antibody positive 01/28/2015  . Herpes simplex type 2 infection 01/28/2015  . History of chicken pox   . Hyperlipidemia   . Hypertension     Past Surgical History:  Procedure Laterality Date  . KNEE SURGERY Left 2000   meninscus repair    There were no vitals filed for this visit.      Subjective Assessment - 03/15/17 1705    Subjective Pt. noting some benefit from ionto patch applied last visit with relief lasting rest of day.     Patient Stated Goals improve function, work without pain   Currently in Pain? No/denies   Pain Score 0-No pain   Multiple Pain Sites No                         [facility] Adult PT Treatment/Exercise - 03/15/17 1717      Shoulder Exercises: Standing   External Rotation 15 reps;Left;Strengthening;Theraband   Theraband Level (Shoulder External Rotation) Level 2 (Red)   External Rotation Limitations with scap squeeze   Internal Rotation 15 reps;Left;Strengthening   Theraband Level (Shoulder Internal Rotation) Level 2 (Red)   Row 15 reps   Row Limitations TRX     Shoulder Exercises: ROM/Strengthening   UBE (Upper Arm  Bike) L2 x 6 min (3/3)   Cybex Row 15 reps  3" hold    Cybex Row Limitations 20# - narrow grip   Wall Pushups 15 reps   Pushups Limitations orange pball against wall   Other ROM/Strengthening Exercises BATCA pulldown - 15# x 15 reps     Shoulder Exercises: Stretch   Other Shoulder Stretches door pec strtech - low only - 30 sec x 3  mid version terminated due to shoulder pain      Modalities   Modalities Iontophoresis     Iontophoresis   Type of Iontophoresis Dexamethasone   Location L anterior shoulder    Dose 1.62ml, 20mA/min    Time 4-6 Hr wear time                 PT Education - 03/15/17 1759    Education provided Yes   Education Details low chest stretch    Person(s) Educated Patient   Methods Explanation;Demonstration;Verbal cues;Handout   Comprehension Verbalized understanding;Returned demonstration;Verbal cues required;Need further instruction             PT Long Term Goals - 03/12/17 1606      PT LONG TERM GOAL #1   Title patient to be independent with advanced HEP   Status  On-going     PT LONG TERM GOAL #2   Title patient to improve L shoulder AROM equal to that of  R shoulder without pain limiting.   Status On-going     PT LONG TERM GOAL #3   Title patient to demonstrate good posture and body mechanics as applicable to daily activities   Status On-going     PT LONG TERM GOAL #4   Title patient to improve L UE strength to >/= 4+/5 without pain   Status On-going     PT LONG TERM GOAL #5   Title patient to report ability to perform job duties wihtout limitations from pain   Status On-going               Plan - 03/15/17 Elizabeth doing well today.  Notes benefit from ionto patch applied last treatment with decreased pain for duration of day following therapy.  Tolerated all activities in therapy well without issue with exception of mid doorway stretch.  Pt. with pain on mid doorway chest stretch which  was relieved with low chest stretch thus HEP updated with low version.  Treatment ending with ionto patch #2/6 to L anterior shoulder for hopeful decrease in post-exercise pain and swelling.     PT Treatment/Interventions ADLs/Self Care Home Management;Cryotherapy;Electrical Stimulation;Iontophoresis 4mg /ml Dexamethasone;Moist Heat;Ultrasound;Neuromuscular re-education;Therapeutic exercise;Therapeutic activities;Patient/family education;Manual techniques;Passive range of motion;Vasopneumatic Device;Taping;Dry needling      Patient will benefit from skilled therapeutic intervention in order to improve the following deficits and impairments:  Decreased activity tolerance, Decreased range of motion, Decreased strength, Pain, Postural dysfunction  Visit Diagnosis: Acute pain of left shoulder  Abnormal posture  Other symptoms and signs involving the musculoskeletal system     Problem List Patient Active Problem List   Diagnosis Date Noted  . Left shoulder pain 02/20/2017  . Bipolar disorder ([place]) 08/25/2016  . Chronic rhinitis 07/24/2016  . Carpal tunnel syndrome 02/07/2016  . GERD (gastroesophageal reflux disease) 12/27/2015  . History of obstructive sleep apnea 10/29/2015  . Diastolic dysfunction 46/96/2952  . Herpes simplex type 1 antibody positive 01/28/2015  . Herpes simplex type 2 infection 01/28/2015  . Benign prostatic hyperplasia with urinary obstruction 10/19/2014  . Preventative health care 09/16/2014  . Erectile dysfunction 09/16/2014  . Depression 08/05/2014  . HTN (hypertension) 08/05/2014  . Hyperlipidemia 08/05/2014    Bess Harvest, PTA 03/15/17 6:12 PM  [facility] [address]  [place], [place], [address] Phone: [phone]   Fax:  [phone]  Name: Francisco Lopez MRN: [id number] Date of Birth: 07/29/1960

## 2017-03-19 ENCOUNTER — Ambulatory Visit: Payer: BLUE CROSS/BLUE SHIELD | Admitting: Physical Therapy

## 2017-03-19 DIAGNOSIS — R293 Abnormal posture: Secondary | ICD-10-CM | POA: Diagnosis not present

## 2017-03-19 DIAGNOSIS — R29898 Other symptoms and signs involving the musculoskeletal system: Secondary | ICD-10-CM | POA: Diagnosis not present

## 2017-03-19 DIAGNOSIS — M25512 Pain in left shoulder: Secondary | ICD-10-CM

## 2017-03-19 NOTE — Therapy (Signed)
[facility] [address]  [place], [place], [address] Phone: [phone]   Fax:  [phone]  Physical Therapy Treatment  Patient Details  Name: Francisco Lopez MRN: [id number] Date of Birth: [birth year] Referring Provider: Dr. Karlton Lemon  Encounter Date: 03/19/2017      PT End of Session - 03/19/17 1703    Visit Number 4   Number of Visits 12   Date for PT Re-Evaluation 04/11/17   PT Start Time 1701   PT Stop Time 1745   PT Time Calculation (min) 44 min   Activity Tolerance Patient tolerated treatment well   Behavior During Therapy [facility] for tasks assessed/performed      Past Medical History:  Diagnosis Date  . Anxiety   . Basal cell carcinoma 2006  . Depression   . Diastolic dysfunction 4/66/5993   Per 2D echo 8/16  . Herpes simplex type 1 antibody positive 01/28/2015  . Herpes simplex type 2 infection 01/28/2015  . History of chicken pox   . Hyperlipidemia   . Hypertension     Past Surgical History:  Procedure Laterality Date  . KNEE SURGERY Left 2000   meninscus repair    There were no vitals filed for this visit.      Subjective Assessment - 03/19/17 1702    Subjective "today is one of the best days"   Patient Stated Goals improve function, work without pain   Currently in Pain? No/denies   Pain Score 0-No pain                         [facility] Adult PT Treatment/Exercise - 03/19/17 1704      Exercises   Exercises Shoulder     Neck Exercises: Standing   Upper Extremity D1 Flexion;Extension;15 reps;Theraband   Theraband Level (UE D1) Level 2 (Red)   Upper Extremity D2 Flexion;Extension;15 reps;Theraband   Theraband Level (UE D2) Level 2 (Red)     Shoulder Exercises: Standing   Row 15 reps   Row Limitations TRX     Shoulder Exercises: ROM/Strengthening   UBE (Upper Arm Bike) L3.5 x 6 min (3/3)   Wall Pushups 15 reps   Wall Pushups Limitations orange pball against wall      Manual Therapy   Manual Therapy Taping   Kinesiotex Create Space     Kinesiotix   Create Space L shoulder impingement pattern                     PT Long Term Goals - 03/12/17 1606      PT LONG TERM GOAL #1   Title patient to be independent with advanced HEP   Status On-going     PT LONG TERM GOAL #2   Title patient to improve L shoulder AROM equal to that of  R shoulder without pain limiting.   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 Taping to L shoulder for hopeful continued postural re-ed and pain relief at L shoulder.    PT Treatment/Interventions ADLs/Self Care Home Management;Cryotherapy;Electrical Stimulation;Iontophoresis 4mg /ml Dexamethasone;Moist Heat;Ultrasound;Neuromuscular re-education;Therapeutic exercise;Therapeutic activities;Patient/family education;Manual techniques;Passive range of motion;Vasopneumatic Device;Taping;Dry needling   Consulted and Agree with Plan of Care Patient      Patient will benefit from skilled therapeutic intervention in order to improve the following deficits and impairments:  Decreased activity tolerance, Decreased range of motion, Decreased strength, Pain, Postural dysfunction  Visit Diagnosis: Acute pain of left shoulder  Abnormal posture  Other symptoms and signs involving the musculoskeletal  system     Problem List Patient Active Problem List   Diagnosis Date Noted  . Left shoulder pain 02/20/2017  . Bipolar disorder ([place]) 08/25/2016  . Chronic rhinitis 07/24/2016  . Carpal tunnel syndrome 02/07/2016  . GERD (gastroesophageal reflux disease) 12/27/2015  . History of obstructive sleep apnea 10/29/2015  . Diastolic dysfunction 56/21/3086  . Herpes simplex type 1 antibody positive 01/28/2015  . Herpes simplex type 2 infection 01/28/2015  . Benign prostatic hyperplasia with urinary obstruction 10/19/2014  . Preventative health care 09/16/2014  . Erectile dysfunction 09/16/2014  . Depression 08/05/2014  . HTN (hypertension) 08/05/2014  . Hyperlipidemia 08/05/2014     Lanney Gins, PT, DPT 03/19/17 5:49 PM   [facility] [address]  [place], [place], [address] Phone: [phone]   Fax:  [phone]  Name: Nezar Buckles MRN: [id number] Date of Birth: 1961/02/21

## 2017-03-22 ENCOUNTER — Ambulatory Visit: Payer: BLUE CROSS/BLUE SHIELD

## 2017-03-26 ENCOUNTER — Ambulatory Visit: Payer: BLUE CROSS/BLUE SHIELD | Admitting: Physical Therapy

## 2017-03-26 DIAGNOSIS — M25512 Pain in left shoulder: Secondary | ICD-10-CM

## 2017-03-26 DIAGNOSIS — R293 Abnormal posture: Secondary | ICD-10-CM

## 2017-03-26 DIAGNOSIS — R29898 Other symptoms and signs involving the musculoskeletal system: Secondary | ICD-10-CM

## 2017-03-26 NOTE — Patient Instructions (Signed)
(  Clinic) PNF: D1 Flexion - Unilateral    Left side toward pulley, arm down and out to side, thumb up, pull arm up and across body, rotating arm to thumb down. Follow hand with head and eyes. Repeat __15__ times per set.   (Clinic) PNF: D1 Extension - Unilateral    Opposite side toward pulley, right arm up, across body, thumb up, pull arm down across body, rotating to thumb down. Follow hand with head and eyes. Repeat __15__ times per set.    (Clinic) PNF: D2 Flexion - Unilateral    Opposite side toward pulley, right arm down, across body, thumb down, pull arm up and out, rotating to thumb up. Follow hand with head and eyes. Repeat __15__ times per set.   (Clinic) PNF: D2 Extension - Unilateral    Left side toward pulley, arm up and out to side, thumb up, pull arm down across body, rotating to thumb down. Follow hand with head and eyes. Repeat __15__ times per set.

## 2017-03-26 NOTE — Therapy (Signed)
[facility] [address]  [place], [place], [address] Phone: [phone]   Fax:  [phone]  Physical Therapy Treatment  Patient Details  Name: Francisco Lopez MRN: [id number] Date of Birth: 05/23/1961 Referring Provider: Dr. Karlton Lemon  Encounter Date: 03/26/2017      PT End of Session - 03/26/17 1702    Visit Number 5   Number of Visits 12   Date for PT Re-Evaluation 04/11/17   PT Start Time 1700   PT Stop Time 1743   PT Time Calculation (min) 43 min   Activity Tolerance Patient tolerated treatment well   Behavior During Therapy [facility] for tasks assessed/performed      Past Medical History:  Diagnosis Date  . Anxiety   . Basal cell carcinoma 2006  . Depression   . Diastolic dysfunction 02/07/9370   Per 2D echo 8/16  . Herpes simplex type 1 antibody positive 01/28/2015  . Herpes simplex type 2 infection 01/28/2015  . History of chicken pox   . Hyperlipidemia   . Hypertension     Past Surgical History:  Procedure Laterality Date  . KNEE SURGERY Left 2000   meninscus repair    There were no vitals filed for this visit.      Subjective Assessment - 03/26/17 1702    Subjective feeling well - taped helped a good bit   Patient Stated Goals improve function, work without pain   Currently in Pain? No/denies   Pain Score 0-No pain                         [facility] Adult PT Treatment/Exercise - 03/26/17 1703      Neck Exercises: Standing   Upper Extremity D1 Flexion;Extension;15 reps;Theraband   Theraband Level (UE D1) Level 2 (Red)   Upper Extremity D2 Flexion;Extension;15 reps;Theraband   Theraband Level (UE D2) Level 2 (Red)     Shoulder Exercises: Prone   Extension Both;15 reps;Weights   Extension Weight (lbs) 1   Extension Limitations prone I's over green pball   Horizontal ABduction 1 Both;15 reps;Weights   Horizontal ABduction 1 Weight (lbs) 1   Horizontal ABduction 1  Limitations prone T's pvergreen pball   Horizontal ABduction 2 Both;15 reps;Weights   Horizontal ABduction 2 Weight (lbs) 1   Horizontal ABduction 2 Limitations prone Y's over green pball     Shoulder Exercises: Standing   Row 15 reps   Row Limitations TRX   Other Standing Exercises upright row - B UE 10# x 15      Shoulder Exercises: ROM/Strengthening   UBE (Upper Arm Bike) L 4.5 x 6 min (3/3)   Wall Pushups 15 reps   Wall Pushups Limitations orange pball against wall     Shoulder Exercises: Body Blade   Flexion 15 seconds;3 reps   Flexion Limitations B UE - forward elevation   External Rotation 15 seconds;3 reps   External Rotation Limitations L UE only                     PT Long Term Goals - 03/12/17 1606      PT LONG TERM GOAL #1   Title patient to be independent with advanced HEP   Status On-going     PT LONG TERM GOAL #2   Title patient to improve L shoulder AROM equal to that of  R shoulder without pain limiting.   Status On-going  PT LONG TERM GOAL #3   Title patient to demonstrate good posture and body mechanics as applicable to daily activities   Status On-going     PT LONG TERM GOAL #4   Title patient to improve L UE strength to >/= 4+/5 without pain   Status On-going     PT LONG TERM GOAL #5   Title patient to report ability to perform job duties wihtout limitations from pain   Status On-going               Plan - 03/26/17 1703    Clinical Impression Statement Chazz noting good benefit from PT and taping. Having reduced pain levels since beginning PT. Doing well with all strengthening tasks of RTC and periscapular musculature with no issue. WIll plan to progress towards goals.    PT Treatment/Interventions ADLs/Self Care Home Management;Cryotherapy;Electrical Stimulation;Iontophoresis 4mg /ml Dexamethasone;Moist Heat;Ultrasound;Neuromuscular re-education;Therapeutic exercise;Therapeutic activities;Patient/family education;Manual  techniques;Passive range of motion;Vasopneumatic Device;Taping;Dry needling   Consulted and Agree with Plan of Care Patient      Patient will benefit from skilled therapeutic intervention in order to improve the following deficits and impairments:  Decreased activity tolerance, Decreased range of motion, Decreased strength, Pain, Postural dysfunction  Visit Diagnosis: Acute pain of left shoulder  Abnormal posture  Other symptoms and signs involving the musculoskeletal system     Problem List Patient Active Problem List   Diagnosis Date Noted  . Left shoulder pain 02/20/2017  . Bipolar disorder ([place]) 08/25/2016  . Chronic rhinitis 07/24/2016  . Carpal tunnel syndrome 02/07/2016  . GERD (gastroesophageal reflux disease) 12/27/2015  . History of obstructive sleep apnea 10/29/2015  . Diastolic dysfunction 07/23/1733  . Herpes simplex type 1 antibody positive 01/28/2015  . Herpes simplex type 2 infection 01/28/2015  . Benign prostatic hyperplasia with urinary obstruction 10/19/2014  . Preventative health care 09/16/2014  . Erectile dysfunction 09/16/2014  . Depression 08/05/2014  . HTN (hypertension) 08/05/2014  . Hyperlipidemia 08/05/2014    Lanney Gins, PT, DPT 03/26/17 5:44 PM   [facility] [address]  [place], [place], [address] Phone: [phone]   Fax:  [phone]  Name: Francisco Lopez MRN: [id number] Date of Birth: [birth year]

## 2017-03-29 ENCOUNTER — Ambulatory Visit: Payer: BLUE CROSS/BLUE SHIELD

## 2017-03-29 DIAGNOSIS — R29898 Other symptoms and signs involving the musculoskeletal system: Secondary | ICD-10-CM | POA: Diagnosis not present

## 2017-03-29 DIAGNOSIS — R293 Abnormal posture: Secondary | ICD-10-CM | POA: Diagnosis not present

## 2017-03-29 DIAGNOSIS — M25512 Pain in left shoulder: Secondary | ICD-10-CM

## 2017-03-29 NOTE — Therapy (Signed)
[facility] [address]  [place], [place], [address] Phone: [phone]   Fax:  [phone]  Physical Therapy Treatment  Patient Details  Name: Francisco Lopez MRN: [id number] Date of Birth: 11/07/1960 Referring Provider: Dr. Karlton Lemon  Encounter Date: 03/29/2017      PT End of Session - 03/29/17 1702    Visit Number 6   Number of Visits 12   Date for PT Re-Evaluation 04/11/17   PT Start Time 1703   PT Stop Time 1753   PT Time Calculation (min) 50 min   Activity Tolerance Patient tolerated treatment well   Behavior During Therapy [facility] for tasks assessed/performed      Past Medical History:  Diagnosis Date  . Anxiety   . Basal cell carcinoma 2006  . Depression   . Diastolic dysfunction 01/27/7115   Per 2D echo 8/16  . Herpes simplex type 1 antibody positive 01/28/2015  . Herpes simplex type 2 infection 01/28/2015  . History of chicken pox   . Hyperlipidemia   . Hypertension     Past Surgical History:  Procedure Laterality Date  . KNEE SURGERY Left 2000   meninscus repair    There were no vitals filed for this visit.      Subjective Assessment - 03/29/17 1708    Subjective Pt. still noting some L shoulder pain with job task.  Upcoming MD f/u on 10.22.18.   Patient Stated Goals improve function, work without pain   Currently in Pain? No/denies   Pain Score 0-No pain   Multiple Pain Sites No            [facility] PT Assessment - 03/29/17 1737      AROM   AROM Assessment Site Shoulder   Right/Left Shoulder Right;Left   Right Shoulder Flexion 162 Degrees   Right Shoulder ABduction 159 Degrees   Right Shoulder Internal Rotation --  T8   Right Shoulder External Rotation --  T5   Left Shoulder Flexion 151 Degrees   Left Shoulder ABduction 135 Degrees  mild pain at end range    Left Shoulder Internal Rotation --  L2; mild pain     Left Shoulder External Rotation --  T1; mild pain      PROM    PROM Assessment Site Shoulder   Right/Left Shoulder Left   Left Shoulder Flexion 162 Degrees  pain at end range    Left Shoulder ABduction 157 Degrees  pain at end range    Left Shoulder Internal Rotation 80 Degrees   Left Shoulder External Rotation 93 Degrees     Strength   Strength Assessment Site Shoulder;Elbow   Right/Left Shoulder Right;Left   Right Shoulder Flexion 4+/5   Right Shoulder ABduction 5/5   Right Shoulder Internal Rotation 4+/5   Right Shoulder External Rotation 4+/5   Left Shoulder Flexion 4/5   Left Shoulder ABduction 4/5   Left Shoulder Internal Rotation 4+/5   Left Shoulder External Rotation 4+/5   Right/Left Elbow Right;Left   Right Elbow Flexion 5/5   Left Elbow Flexion 5/5                     [facility] Adult PT Treatment/Exercise - 03/29/17 1715      Shoulder Exercises: Standing   External Rotation Left;Strengthening;Theraband;10 reps   Theraband Level (Shoulder External Rotation) Level 3 (Green)   External Rotation Limitations with scap squeeze   Internal Rotation Left;Strengthening;10 reps   Theraband  Level (Shoulder Internal Rotation) Level 3 (Green)   Row 15 reps   Row Limitations TRX   Other Standing Exercises row with 45 dg with green TB 3" x 15 reps      Shoulder Exercises: ROM/Strengthening   UBE (Upper Arm Bike) L 4.5 x 6 min (3/3)   Cybex Row 15 reps   Cybex Row Limitations 25# - narrow grip   Wall Pushups 15 reps   Wall Pushups Limitations orange pball against wall   Other ROM/Strengthening Exercises BATCA pulldown - 20# x 15 reps     Shoulder Exercises: Stretch   Other Shoulder Stretches door pec strtech - low only - 30 sec x 3                PT Education - 03/29/17 1701    Education provided Yes   Education Details green TB for row issue to pt.    Person(s) Educated Patient   Methods Explanation;Demonstration;Verbal cues;Handout   Comprehension Verbalized understanding;Returned demonstration;Verbal cues  required;Need further instruction             PT Long Term Goals - 03/29/17 1735      PT LONG TERM GOAL #1   Title patient to be independent with advanced HEP   Status On-going     PT LONG TERM GOAL #2   Title patient to improve L shoulder AROM equal to that of  R shoulder without pain limiting.   Status On-going     PT LONG TERM GOAL #3   Title patient to demonstrate good posture and body mechanics as applicable to daily activities   Status On-going     PT LONG TERM GOAL #4   Title patient to improve L UE strength to >/= 4+/5 without pain   Status Partially Met     PT LONG TERM GOAL #5   Title patient to report ability to perform job duties wihtout limitations from pain   Status Achieved  still with pain with managing "fume hose" on truck                Plan - 03/29/17 1714    Clinical Impression Statement Francisco Lopez making good progress with therapy thus far showing significant AROM improvement however still with some pain at end range.  Reports most pain still with managing "fume hose" with job however reports this pain does not limit completion of this task.  Good strength improvement able to partially meet this goal today.  Francisco Lopez still presenting with poor resting posture in standing however able to self-correct with cueing.  Will continue to progress as pt. able in coming visits.     PT Treatment/Interventions ADLs/Self Care Home Management;Cryotherapy;Electrical Stimulation;Iontophoresis 12m/ml Dexamethasone;Moist Heat;Ultrasound;Neuromuscular re-education;Therapeutic exercise;Therapeutic activities;Patient/family education;Manual techniques;Passive range of motion;Vasopneumatic Device;Taping;Dry needling      Patient will benefit from skilled therapeutic intervention in order to improve the following deficits and impairments:  Decreased activity tolerance, Decreased range of motion, Decreased strength, Pain, Postural dysfunction  Visit Diagnosis: Acute pain of  left shoulder  Abnormal posture  Other symptoms and signs involving the musculoskeletal system     Problem List Patient Active Problem List   Diagnosis Date Noted  . Left shoulder pain 02/20/2017  . Bipolar disorder ([place] 08/25/2016  . Chronic rhinitis 07/24/2016  . Carpal tunnel syndrome 02/07/2016  . GERD (gastroesophageal reflux disease) 12/27/2015  . History of obstructive sleep apnea 10/29/2015  . Diastolic dysfunction 051/70/0174 . Herpes simplex type 1 antibody positive 01/28/2015  .  Herpes simplex type 2 infection 01/28/2015  . Benign prostatic hyperplasia with urinary obstruction 10/19/2014  . Preventative health care 09/16/2014  . Erectile dysfunction 09/16/2014  . Depression 08/05/2014  . HTN (hypertension) 08/05/2014  . Hyperlipidemia 08/05/2014    Bess Harvest, PTA 03/29/17 6:06 PM  [facility] [address]  [place], [place], [address] Phone: [phone]   Fax:  [phone]  Name: Francisco Lopez MRN: [id number] Date of Birth: [birth year]

## 2017-04-01 ENCOUNTER — Other Ambulatory Visit: Payer: Self-pay | Admitting: Medical

## 2017-04-02 ENCOUNTER — Ambulatory Visit (INDEPENDENT_AMBULATORY_CARE_PROVIDER_SITE_OTHER): Payer: BLUE CROSS/BLUE SHIELD | Admitting: Family Medicine

## 2017-04-02 ENCOUNTER — Encounter: Payer: Self-pay | Admitting: Family Medicine

## 2017-04-02 ENCOUNTER — Ambulatory Visit: Payer: BLUE CROSS/BLUE SHIELD | Admitting: Physical Therapy

## 2017-04-02 DIAGNOSIS — M25512 Pain in left shoulder: Secondary | ICD-10-CM

## 2017-04-02 NOTE — Patient Instructions (Signed)
We will go ahead with an MRI of your shoulder given you're not improving as expected. It's ok to continue with therapy in the meantime.

## 2017-04-03 NOTE — Addendum Note (Signed)
Addended by: Sherrie George F on: 04/03/2017 01:40 PM   Modules accepted: Orders

## 2017-04-03 NOTE — Assessment & Plan Note (Signed)
Consistent with rotator cuff impingement but not improving as much as expected with PT, home exercises, s/p injection.  Continue meloxicam, PT, home exercises.  Will go ahead with MRI to further assess for possible rotator cuff tear.  He takes cialis so couldn't take nitro patches.

## 2017-04-03 NOTE — Progress Notes (Signed)
PCP and consultation requested by: [name], NP  Subjective:   HPI: Patient is a 56 y.o. male here for left shoulder pain.  9/10: Patient reports about 1 1/2 months ago he started to get pain in lateral left shoulder. No acute injury or trauma. 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 While doing this he gets worse pain in lateral left shoulder. Pain level up to 5/10 and like a toothache but sharp with reaching, overhead motion. Taking meloxicam from PCP which has helped some. No skin changes, numbness.  10/22: Patient reports he's doing better than last visit. Pain level down to 4/10 and sharp lateral shoulder. Worse first thing in the morning and at nighttime. Doing physical therapy and home exercises, feels stronger. No skin changes, numbness.  Past Medical History:  Diagnosis Date  . Anxiety   . Basal cell carcinoma 2006  . Depression   . Diastolic dysfunction 10/15/3974   Per 2D echo 8/16  . Herpes simplex type 1 antibody positive 01/28/2015  . Herpes simplex type 2 infection 01/28/2015  . History of chicken pox   . Hyperlipidemia   . Hypertension     Current Outpatient Prescriptions on File Prior to Visit  Medication Sig Dispense Refill  . amlodipine-atorvastatin (CADUET) 10-20 MG tablet TAKE 1 TABLET BY MOUTH EVERY DAY 90 tablet 0  . fenofibrate (TRICOR) 145 MG tablet TAKE 1 TABLET (145 MG TOTAL) BY MOUTH DAILY. 30 tablet 5  . fluticasone (FLONASE) 50 MCG/ACT nasal spray SPRAY 2 SPRAYS INTO EACH NOSTRIL EVERY DAY 16 g 0  . Krill Oil 1000 MG CAPS Take by mouth.    . meloxicam (MOBIC) 7.5 MG tablet Take 1 tablet (7.5 mg total) by mouth daily. 14 tablet 0  . Multiple Vitamins-Minerals (MENS MULTIVITAMIN PLUS PO) Take by mouth.    Marland Kitchen omeprazole (PRILOSEC) 40 MG capsule Take 1 capsule (40 mg total) by mouth daily. 30 capsule 3  . QUEtiapine Fumarate (SEROQUEL XR) 150 MG 24 hr tablet Take 1 tablet (150 mg total) by mouth at bedtime. 30  tablet 3  . tadalafil (CIALIS) 5 MG tablet Take 5 mg by mouth daily as needed.    . valACYclovir (VALTREX) 500 MG tablet TAKE 1 TABLET (500 MG TOTAL) BY MOUTH DAILY. 30 tablet 5   No current facility-administered medications on file prior to visit.     Past Surgical History:  Procedure Laterality Date  . KNEE SURGERY Left 2000   meninscus repair    No Known Allergies  Social History   Social History  . Marital status: Legally Separated    Spouse name: N/A  . Number of children: N/A  . Years of education: N/A   Occupational History  . Not on file.   Social History Main Topics  . Smoking status: Never Smoker  . Smokeless tobacco: Never Used  . Alcohol use No  . Drug use: No  . Sexual activity: Yes   Other Topics Concern  . 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 Diabetes Mother        type II  . Anxiety disorder Mother   . Hypertension Father   . Alcohol abuse Father     BP 126/82   Pulse 80  Ht 5\' 10"  (1.778 m)   Wt 168 lb (76.2 kg)   BMI 24.11 kg/m   Review of Systems: See HPI above.     Objective:  Physical Exam:  Gen: NAD, comfortable in exam room.  Left shoulder: No swelling, ecchymoses.  No gross deformity. No TTP. FROM with painful arc especially at extent of flexion and abduction. Positive Hawkins, Neers. Negative Yergasons. Strength 5/5 with empty can and resisted internal/external rotation. Pain empty can. Negative apprehension. NV intact distally.  Right shoulder: No swelling, ecchymoses.  No gross deformity. No TTP. FROM. Strength 5/5 with empty can and resisted internal/external rotation. NV intact distally.   Assessment & Plan:  1. Left shoulder pain - Consistent with rotator cuff impingement but not improving as much as expected with PT, home  exercises, s/p injection.  Continue meloxicam, PT, home exercises.  Will go ahead with MRI to further assess for possible rotator cuff tear.  He takes cialis so couldn't take nitro patches.

## 2017-04-05 ENCOUNTER — Ambulatory Visit: Payer: BLUE CROSS/BLUE SHIELD

## 2017-04-05 DIAGNOSIS — M25512 Pain in left shoulder: Secondary | ICD-10-CM

## 2017-04-05 DIAGNOSIS — R29898 Other symptoms and signs involving the musculoskeletal system: Secondary | ICD-10-CM | POA: Diagnosis not present

## 2017-04-05 DIAGNOSIS — R293 Abnormal posture: Secondary | ICD-10-CM

## 2017-04-05 NOTE — Therapy (Addendum)
[facility] [address]  [place], [place], [address] Phone: [phone]   Fax:  [phone]  Physical Therapy Treatment  Patient Details  Name: Francisco Lopez MRN: [id number] Date of Birth: 11-24-1960 Referring Provider: Dr. Karlton Lemon  Encounter Date: 04/05/2017      PT End of Session - 04/05/17 1709    Visit Number 7   Number of Visits 12   Date for PT Re-Evaluation 04/11/17   PT Start Time 1700   PT Stop Time 1745   PT Time Calculation (min) 45 min   Activity Tolerance Patient tolerated treatment well   Behavior During Therapy [facility] for tasks assessed/performed      Past Medical History:  Diagnosis Date  . Anxiety   . Basal cell carcinoma 2006  . Depression   . Diastolic dysfunction 2/48/2500   Per 2D echo 8/16  . Herpes simplex type 1 antibody positive 01/28/2015  . Herpes simplex type 2 infection 01/28/2015  . History of chicken pox   . Hyperlipidemia   . Hypertension     Past Surgical History:  Procedure Laterality Date  . KNEE SURGERY Left 2000   meninscus repair    There were no vitals filed for this visit.      Subjective Assessment - 04/05/17 1708    Subjective Pt. reporting MD ordered MRI for 10.31.18.  Pt. reporting he is having pain with sleeping more frequently then last week.     Patient Stated Goals improve function, work without pain   Currently in Pain? No/denies   Pain Score 0-No pain   Multiple Pain Sites No                         [facility] Adult PT Treatment/Exercise - 04/05/17 1713      Shoulder Exercises: Standing   External Rotation Left;Strengthening;Theraband;10 reps  neutral    Theraband Level (Shoulder External Rotation) Level 3 (Green)   External Rotation Limitations with scap squeeze   Internal Rotation Left;Strengthening;10 reps   Theraband Level (Shoulder Internal Rotation) Level 3 (Green)   Internal Rotation Limitations neutral    Extension  Both;15 reps;Theraband   Theraband Level (Shoulder Extension) Level 3 (Green)   Row 15 reps   Row Limitations TRX     Shoulder Exercises: ROM/Strengthening   UBE (Upper Arm Bike) L 4.5 x 6 min (3/3)   Cybex Row 20 reps   Cybex Row Limitations 25# - narrow grip   Other ROM/Strengthening Exercises BATCA pulldown - 20# x 15 reps   Other ROM/Strengthening Exercises L BATCA single arm ro 15# x 10 reps      Manual Therapy   Manual Therapy Taping   Manual therapy comments patient supine   Soft tissue mobilization STM to L deltoid, UT, biceps   Passive ROM PROM into flexion, abduction, ER, IR - full motion however pain at all end ranges    Kinesiotex Create Space     Kinesiotix   Create Space L shoulder impingement pattern                     PT Long Term Goals - 03/29/17 1735      PT LONG TERM GOAL #1   Title patient to be independent with advanced HEP   Status On-going     PT LONG TERM GOAL #2   Title patient to improve L shoulder AROM equal to that of  R  shoulder without pain limiting.   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  Reporting MD ordered MRI for 10.31.18 and feels he wishes to see MRI results before scheduling further therapy.  Feels he will likely continue therapy however wishes to call and schedule further visits at a later  time.  Tolerated all scapular strengthening activities well today.  Reports he had pain relief from previous impingement taping thus taping re-applied today for hopeful pain relief with functional activities.   PT Treatment/Interventions ADLs/Self Care Home Management;Cryotherapy;Electrical Stimulation;Iontophoresis 48m/ml Dexamethasone;Moist Heat;Ultrasound;Neuromuscular re-education;Therapeutic exercise;Therapeutic activities;Patient/family education;Manual techniques;Passive range of motion;Vasopneumatic Device;Taping;Dry needling      Patient will benefit from skilled therapeutic intervention in order to improve the following deficits and impairments:  Decreased activity tolerance, Decreased range of motion, Decreased strength, Pain, Postural dysfunction  Visit Diagnosis: Acute pain of left shoulder  Abnormal posture  Other symptoms and signs involving the musculoskeletal system     Problem List Patient Active Problem List   Diagnosis Date Noted  . Left shoulder pain 02/20/2017  . Bipolar disorder ([place] 08/25/2016  . Chronic rhinitis 07/24/2016  . Carpal tunnel syndrome 02/07/2016  . GERD (gastroesophageal reflux disease) 12/27/2015  . History of obstructive sleep apnea 10/29/2015  . Diastolic dysfunction 001/75/1025 . Herpes simplex type 1 antibody positive 01/28/2015  . Herpes simplex type 2 infection 01/28/2015  . Benign prostatic hyperplasia with urinary obstruction 10/19/2014  . Preventative health care 09/16/2014  . Erectile dysfunction 09/16/2014  . Depression 08/05/2014  . HTN (hypertension) 08/05/2014  . Hyperlipidemia 08/05/2014    MBess Harvest PTA 04/05/17 6:17 PM  PHYSICAL THERAPY DISCHARGE SUMMARY  Visits from Start of Care: 7  Current functional level related to goals / functional outcomes: See above   Remaining deficits: See above: per char review MRI revealing labral tears, capsulitis and tendinosis - MD proceeding with intraarticular injections    Education / Equipment: HEP  Plan: Patient agrees to discharge.  Patient goals were partially met. Patient is being discharged due to the patient's request.  ?????     SLanney Gins PT, DPT 05/30/17 3:54 PM   [facility] [place] [address]Phone: [phone]  Fax:  [phone] Name: Francisco LindamanMRN: [id number] of Birth: 712-13-1962

## 2017-04-11 ENCOUNTER — Other Ambulatory Visit: Payer: Self-pay | Admitting: Family

## 2017-04-11 DIAGNOSIS — M19012 Primary osteoarthritis, left shoulder: Secondary | ICD-10-CM | POA: Diagnosis not present

## 2017-04-11 DIAGNOSIS — R600 Localized edema: Secondary | ICD-10-CM | POA: Diagnosis not present

## 2017-04-11 DIAGNOSIS — M7502 Adhesive capsulitis of left shoulder: Secondary | ICD-10-CM | POA: Diagnosis not present

## 2017-04-11 DIAGNOSIS — M958 Other specified acquired deformities of musculoskeletal system: Secondary | ICD-10-CM | POA: Diagnosis not present

## 2017-04-12 ENCOUNTER — Other Ambulatory Visit: Payer: Self-pay | Admitting: Family

## 2017-04-13 ENCOUNTER — Other Ambulatory Visit: Payer: Self-pay | Admitting: Family

## 2017-04-13 ENCOUNTER — Encounter: Payer: Self-pay | Admitting: Family Medicine

## 2017-04-13 MED ORDER — QUETIAPINE FUMARATE ER 150 MG PO TB24: 150.0000 mg | ORAL_TABLET | Freq: Every day | ORAL | 0 refills | Status: DC

## 2017-04-20 ENCOUNTER — Encounter: Payer: Self-pay | Admitting: Family Medicine

## 2017-04-20 ENCOUNTER — Ambulatory Visit (INDEPENDENT_AMBULATORY_CARE_PROVIDER_SITE_OTHER): Payer: BLUE CROSS/BLUE SHIELD | Admitting: Family Medicine

## 2017-04-20 DIAGNOSIS — M25512 Pain in left shoulder: Secondary | ICD-10-CM

## 2017-04-20 MED ORDER — METHYLPREDNISOLONE ACETATE 40 MG/ML IJ SUSP
40.0000 mg | Freq: Once | INTRAMUSCULAR | Status: AC
  Administered 2017-04-20: 40 mg via INTRA_ARTICULAR

## 2017-04-20 NOTE — Progress Notes (Signed)
PCP and consultation requested by: [name], NP  Subjective:   HPI: Patient is a 56 y.o. male here for left shoulder pain.  9/10: Patient reports about 1 1/2 months ago he started to get pain in lateral left shoulder. No acute injury or trauma. 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 While doing this he gets worse pain in lateral left shoulder. Pain level up to 5/10 and like a toothache but sharp with reaching, overhead motion. Taking meloxicam from PCP which has helped some. No skin changes, numbness.  10/22: Patient reports he's doing better than last visit. Pain level down to 4/10 and sharp lateral shoulder. Worse first thing in the morning and at nighttime. Doing physical therapy and home exercises, feels stronger. No skin changes, numbness.  11/9: Patient returns for intraarticular injection after we had discussed MRI. Pain level 4/10 but up to 10/10 yesterday.  Past Medical History:  Diagnosis Date  . Anxiety   . Basal cell carcinoma 2006  . Depression   . Diastolic dysfunction 8/33/8250   Per 2D echo 8/16  . Herpes simplex type 1 antibody positive 01/28/2015  . Herpes simplex type 2 infection 01/28/2015  . History of chicken pox   . Hyperlipidemia   . Hypertension     Current Outpatient Medications on File Prior to Visit  Medication Sig Dispense Refill  . amlodipine-atorvastatin (CADUET) 10-20 MG tablet TAKE 1 TABLET BY MOUTH EVERY DAY 90 tablet 1  . divalproex (DEPAKOTE) 250 MG DR tablet TAKE 2 TO 3 TABLETS BY MOUTH EVERY EVENING  12  . fenofibrate (TRICOR) 145 MG tablet TAKE 1 TABLET (145 MG TOTAL) BY MOUTH DAILY. 30 tablet 5  . fluticasone (FLONASE) 50 MCG/ACT nasal spray SPRAY 2 SPRAYS INTO EACH NOSTRIL EVERY DAY 16 g 0  . Krill Oil 1000 MG CAPS Take by mouth.    . meloxicam (MOBIC) 7.5 MG tablet Take 1 tablet (7.5 mg total) by mouth daily. 14 tablet 0  . Multiple Vitamins-Minerals (MENS MULTIVITAMIN PLUS PO) Take by mouth.     Marland Kitchen omeprazole (PRILOSEC) 40 MG capsule Take 1 capsule (40 mg total) by mouth daily. 30 capsule 3  . QUEtiapine Fumarate (SEROQUEL XR) 150 MG 24 hr tablet Take 1 tablet (150 mg total) by mouth at bedtime. 90 tablet 0  . tadalafil (CIALIS) 5 MG tablet Take 5 mg by mouth daily as needed.    . traZODone (DESYREL) 50 MG tablet TAKE 1 TO 3 TABLETS BY MOUTH AT BEDTIME  11  . valACYclovir (VALTREX) 500 MG tablet TAKE 1 TABLET (500 MG TOTAL) BY MOUTH DAILY. 30 tablet 5   No current facility-administered medications on file prior to visit.     Past Surgical History:  Procedure Laterality Date  . KNEE SURGERY Left 2000   meninscus repair    No Known Allergies  Social History   Socioeconomic History  . Marital status: Legally Separated    Spouse name: Not on file  . Number of children: Not on file  . Years of education: Not on file  . Highest education level: Not on file  Social Needs  . Financial resource strain: Not on file  . Food insecurity - worry: Not on file  . Food insecurity - inability: Not on file  . Transportation needs - medical: Not on file  . Transportation needs - non-medical: Not on file  Occupational History  . Not on file  Tobacco Use  . Smoking status: Never Smoker  .  Smokeless tobacco: Never Used  Substance and Sexual Activity  . Alcohol use: No  . Drug use: No  . Sexual activity: Yes  Other Topics Concern  . 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 Diabetes Mother        type II  . Anxiety disorder Mother   . Hypertension Father   . Alcohol abuse Father     BP 134/87   Pulse 76   Ht 5\' 7"  (1.702 m)   BMI 26.31 kg/m   Review of Systems: See HPI above.     Objective:  Physical Exam:  Gen: NAD, comfortable in exam room.  Left shoulder: No swelling, ecchymoses.  No  gross deformity. No TTP. Only lacks about 10 degrees flexion and abduction, 5 degrees ER.  Painful arc. Strength 5/5 with empty can and resisted internal/external rotation. Pain empty can.   Assessment & Plan:  1. Left shoulder pain - Minimal improvement with PT, home exercises, subacromial injection.  MRI reviewed showing evidence labral tears, adhesive capsulitis, tendinosis.  Motion is relatively good - advised we go ahead with intraarticular injection (could do up to 3 spaced 4 weeks apart).  If he does not notice improvement however advised would consider ortho referral for arthroscopy, debridement.  F/u in 4 weeks.  After informed written consent timeout was performed, patient was seated on exam table. Left shoulder was prepped with alcohol swab and utilizing posterior approach, patient's left glenohumeral space was injected with 3:1 bupivicaine: depomedrol. Patient tolerated the procedure well without immediate complications.

## 2017-04-20 NOTE — Assessment & Plan Note (Signed)
Minimal improvement with PT, home exercises, subacromial injection.  MRI reviewed showing evidence labral tears, adhesive capsulitis, tendinosis.  Motion is relatively good - advised we go ahead with intraarticular injection (could do up to 3 spaced 4 weeks apart).  If he does not notice improvement however advised would consider ortho referral for arthroscopy, debridement.  F/u in 4 weeks.  After informed written consent timeout was performed, patient was seated on exam table. Left shoulder was prepped with alcohol swab and utilizing posterior approach, patient's left glenohumeral space was injected with 3:1 bupivicaine: depomedrol. Patient tolerated the procedure well without immediate complications.

## 2017-04-24 DIAGNOSIS — F310 Bipolar disorder, current episode hypomanic: Secondary | ICD-10-CM | POA: Diagnosis not present

## 2017-04-25 ENCOUNTER — Other Ambulatory Visit: Payer: Self-pay | Admitting: Family

## 2017-04-30 ENCOUNTER — Other Ambulatory Visit: Payer: Self-pay | Admitting: Family

## 2017-05-16 ENCOUNTER — Ambulatory Visit: Payer: BLUE CROSS/BLUE SHIELD | Admitting: Family

## 2017-05-18 ENCOUNTER — Ambulatory Visit: Payer: BLUE CROSS/BLUE SHIELD | Admitting: Family Medicine

## 2017-05-23 ENCOUNTER — Other Ambulatory Visit: Payer: Self-pay | Admitting: Family

## 2017-06-26 ENCOUNTER — Encounter: Payer: Self-pay | Admitting: Family

## 2017-06-26 ENCOUNTER — Ambulatory Visit: Payer: BLUE CROSS/BLUE SHIELD | Admitting: Family

## 2017-06-26 VITALS — BP 130/72 | HR 61 | Temp 98.1°F | Resp 16 | Ht 71.0 in | Wt 187.8 lb

## 2017-06-26 DIAGNOSIS — E785 Hyperlipidemia, unspecified: Secondary | ICD-10-CM | POA: Diagnosis not present

## 2017-06-26 DIAGNOSIS — F39 Unspecified mood [affective] disorder: Secondary | ICD-10-CM

## 2017-06-26 DIAGNOSIS — I10 Essential (primary) hypertension: Secondary | ICD-10-CM

## 2017-06-26 DIAGNOSIS — R739 Hyperglycemia, unspecified: Secondary | ICD-10-CM | POA: Diagnosis not present

## 2017-06-26 LAB — LIPID PANEL
Cholesterol: 136 mg/dL (ref 0–200)
HDL: 40.6 mg/dL (ref >39.00)
LDL Cholesterol: 77 mg/dL (ref 0–99)
NONHDL: 95.82
Total CHOL/HDL Ratio: 3
Triglycerides: 95 mg/dL (ref 0.0–149.0)
VLDL: 19 mg/dL (ref 0.0–40.0)

## 2017-06-26 LAB — HEMOGLOBIN A1C: Hgb A1c MFr Bld: 5.6 % (ref 4.6–6.5)

## 2017-06-26 LAB — BASIC METABOLIC PANEL
BUN: 17 mg/dL (ref 6–23)
CO2: 29 mEq/L (ref 19–32)
CREATININE: 0.98 mg/dL (ref 0.40–1.50)
Calcium: 9.8 mg/dL (ref 8.4–10.5)
Chloride: 104 mEq/L (ref 96–112)
GFR: 83.93 mL/min (ref >60.00)
GLUCOSE: 95 mg/dL (ref 70–99)
POTASSIUM: 4.8 meq/L (ref 3.5–5.1)
Sodium: 140 mEq/L (ref 135–145)

## 2017-06-26 NOTE — Patient Instructions (Signed)
Please complete lab work prior to leaving.   

## 2017-06-26 NOTE — Assessment & Plan Note (Signed)
Stable, now following with Dr. Toy Care- psych. Advised pt to continue follow up with her.

## 2017-06-26 NOTE — Progress Notes (Signed)
Subjective:    Patient ID: Francisco Lopez, male    DOB: Jun 05, 1961, 57 y.o.   MRN: [id number]  HPI  Francisco Lopez is a 57 yr old male who presents today for follow up.  Saw psychiatry and was diagnosed with "severe mood swings not bipolar."  Francisco Lopez met with Francisco Lopez.  Reports that Francisco Lopez placed him on depakote and seroquel.  Feels like Francisco Lopez mood is improved and Francisco Lopez is better able to let things go.   HTN-  BP Readings from Last 3 Encounters:  06/26/17 (!) 146/84  04/20/17 134/87  04/02/17 126/82   Hyperlipidemia- maintained on caduet and tricor.  Lab Results  Component Value Date   CHOL 155 08/14/2016   HDL 40.70 08/14/2016   LDLCALC 93 08/14/2016   LDLDIRECT 97.0 12/27/2015   TRIG 107.0 08/14/2016   CHOLHDL 4 08/14/2016    Review of Systems See HPI    Past Medical History:  Diagnosis Date  . Anxiety   . Basal cell carcinoma 2006  . Depression   . Diastolic dysfunction 11/23/4313   Per 2D echo 8/16  . Herpes simplex type 1 antibody positive 01/28/2015  . Herpes simplex type 2 infection 01/28/2015  . History of chicken pox   . Hyperlipidemia   . Hypertension      Social History   Socioeconomic History  . Marital status: Legally Separated    Spouse name: Not on file  . Number of children: Not on file  . Years of education: Not on file  . Highest education level: Not on file  Social Needs  . Financial resource strain: Not on file  . Food insecurity - worry: Not on file  . Food insecurity - inability: Not on file  . Transportation needs - medical: Not on file  . Transportation needs - non-medical: Not on file  Occupational History  . Not on file  Tobacco Use  . Smoking status: Never Smoker  . Smokeless tobacco: Never Used  Substance and Sexual Activity  . Alcohol use: No  . Drug use: No  . Sexual activity: Yes  Other Topics Concern  . 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 KNEE SURGERY Left 2000   meninscus repair    Family History  Problem Relation Age of Onset  . Diabetes Mother        type II  . Anxiety disorder Mother   . Hypertension Father   . Alcohol abuse Father     No Known Allergies  Current Outpatient Medications on File Prior to Visit  Medication Sig Dispense Refill  . amlodipine-atorvastatin (CADUET) 10-20 MG tablet TAKE 1 TABLET BY MOUTH EVERY DAY 90 tablet 1  . divalproex (DEPAKOTE) 250 MG DR tablet TAKE 2 TO 3 TABLETS BY MOUTH EVERY EVENING  12  . fenofibrate (TRICOR) 145 MG tablet TAKE 1 TABLET (145 MG TOTAL) BY MOUTH DAILY. 30 tablet 5  . fluticasone (FLONASE) 50 MCG/ACT nasal spray SPRAY TWICE INTO EACH NOSTRIL EVERY DAY 16 g 3  . Krill Oil 1000 MG CAPS Take by mouth.    . meloxicam (MOBIC) 7.5 MG tablet Take 1 tablet (7.5 mg total) by mouth daily. 14 tablet 0  . Multiple Vitamins-Minerals (MENS MULTIVITAMIN PLUS PO) Take by mouth.    Marland Kitchen  omeprazole (PRILOSEC) 40 MG capsule TAKE 1 CAPSULE BY MOUTH EVERY DAY 30 capsule 3  . QUEtiapine (SEROQUEL) 50 MG tablet Take 50-100 mg by mouth at bedtime.    . tadalafil (CIALIS) 5 MG tablet Take 5 mg by mouth daily as needed.    . traZODone (DESYREL) 50 MG tablet TAKE 1 TO 3 TABLETS BY MOUTH AT BEDTIME  11  . valACYclovir (VALTREX) 500 MG tablet TAKE 1 TABLET (500 MG TOTAL) BY MOUTH DAILY. 30 tablet 5   No current facility-administered medications on file prior to visit.     BP (!) 146/84 (BP Location: Right Arm, Cuff Size: Normal)   Pulse 61   Temp 98.1 F (36.7 C) (Oral)   Resp 16   Ht _0  (1.803 m)   Wt 187 lb 12.8 oz (85.2 kg)   SpO2 99%   BMI 26.19 kg/m    Objective:   Physical Exam  Constitutional: Francisco Lopez is oriented to person, place, and time. Francisco Lopez appears well-developed and well-nourished. No distress.  HENT:  Head: Normocephalic and atraumatic.  Cardiovascular: Normal rate and regular  rhythm.  No murmur heard. Pulmonary/Chest: Effort normal and breath sounds normal. No respiratory distress. Francisco Lopez has no wheezes. Francisco Lopez has no rales.  Musculoskeletal: Francisco Lopez exhibits no edema.  Neurological: Francisco Lopez is alert and oriented to person, place, and time.  Skin: Skin is warm and dry.  Psychiatric: Francisco Lopez has a normal mood and affect. Francisco Lopez behavior is normal. Thought content normal.     Past Medical History:  Diagnosis Date  . Anxiety   . Basal cell carcinoma 2006  . Depression   . Diastolic dysfunction 12/20/6267   Per 2D echo 8/16  . Herpes simplex type 1 antibody positive 01/28/2015  . Herpes simplex type 2 infection 01/28/2015  . History of chicken pox   . Hyperlipidemia   . Hypertension      Social History   Socioeconomic History  . Marital status: Legally Separated    Spouse name: Not on file  . Number of children: Not on file  . Years of education: Not on file  . Highest education level: Not on file  Social Needs  . Financial resource strain: Not on file  . Food insecurity - worry: Not on file  . Food insecurity - inability: Not on file  . Transportation needs - medical: Not on file  . Transportation needs - non-medical: Not on file  Occupational History  . Not on file  Tobacco Use  . Smoking status: Never Smoker  . Smokeless tobacco: Never Used  Substance and Sexual Activity  . Alcohol use: No  . Drug use: No  . Sexual activity: Yes  Other Topics Concern  . 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 KNEE SURGERY Left 2000   meninscus repair    Family History  Problem Relation Age of Onset  . Diabetes Mother        type II  . Anxiety disorder Mother   . Hypertension Father   . Alcohol abuse Father     No Known Allergies  Current Outpatient Medications on File Prior to Visit    Medication Sig Dispense Refill  . amlodipine-atorvastatin (CADUET) 10-20 MG tablet TAKE 1 TABLET BY MOUTH EVERY DAY 90 tablet 1  .  divalproex (DEPAKOTE) 250 MG DR tablet TAKE 2 TO 3 TABLETS BY MOUTH EVERY EVENING  12  . fenofibrate (TRICOR) 145 MG tablet TAKE 1 TABLET (145 MG TOTAL) BY MOUTH DAILY. 30 tablet 5  . fluticasone (FLONASE) 50 MCG/ACT nasal spray SPRAY TWICE INTO EACH NOSTRIL EVERY DAY 16 g 3  . Krill Oil 1000 MG CAPS Take by mouth.    . meloxicam (MOBIC) 7.5 MG tablet Take 1 tablet (7.5 mg total) by mouth daily. 14 tablet 0  . Multiple Vitamins-Minerals (MENS MULTIVITAMIN PLUS PO) Take by mouth.    Marland Kitchen omeprazole (PRILOSEC) 40 MG capsule TAKE 1 CAPSULE BY MOUTH EVERY DAY 30 capsule 3  . QUEtiapine (SEROQUEL) 50 MG tablet Take 50-100 mg by mouth at bedtime.    . tadalafil (CIALIS) 5 MG tablet Take 5 mg by mouth daily as needed.    . traZODone (DESYREL) 50 MG tablet TAKE 1 TO 3 TABLETS BY MOUTH AT BEDTIME  11  . valACYclovir (VALTREX) 500 MG tablet TAKE 1 TABLET (500 MG TOTAL) BY MOUTH DAILY. 30 tablet 5   No current facility-administered medications on file prior to visit.     BP 130/72   Pulse 61   Temp 98.1 F (36.7 C) (Oral)   Resp 16   Ht _0  (1.803 m)   Wt 187 lb 12.8 oz (85.2 kg)   SpO2 99%   BMI 26.19 kg/m        Assessment & Plan:  HTN- BP stable on current meds.    Hyperlipidemia- obtain follow up lipid panel.   Hyperglycemia- mild, check A1C.

## 2017-06-28 ENCOUNTER — Encounter: Payer: Self-pay | Admitting: Family

## 2017-08-21 ENCOUNTER — Other Ambulatory Visit: Payer: Self-pay | Admitting: Family

## 2017-08-27 ENCOUNTER — Ambulatory Visit (INDEPENDENT_AMBULATORY_CARE_PROVIDER_SITE_OTHER): Payer: BLUE CROSS/BLUE SHIELD | Admitting: Family

## 2017-08-27 ENCOUNTER — Encounter: Payer: Self-pay | Admitting: Family

## 2017-08-27 VITALS — BP 134/75 | HR 60 | Temp 97.8°F | Resp 16 | Ht 71.0 in | Wt 188.8 lb

## 2017-08-27 DIAGNOSIS — Z125 Encounter for screening for malignant neoplasm of prostate: Secondary | ICD-10-CM | POA: Diagnosis not present

## 2017-08-27 DIAGNOSIS — Z0000 Encounter for general adult medical examination without abnormal findings: Secondary | ICD-10-CM

## 2017-08-27 LAB — CBC WITH DIFFERENTIAL/PLATELET
BASOS ABS: 0 10*3/uL (ref 0.0–0.1)
Basophils Relative: 0.5 % (ref 0.0–3.0)
EOS ABS: 0.1 10*3/uL (ref 0.0–0.7)
Eosinophils Relative: 1.7 % (ref 0.0–5.0)
HCT: 45.1 % (ref 39.0–52.0)
Hemoglobin: 15.6 g/dL (ref 13.0–17.0)
LYMPHS ABS: 2.2 10*3/uL (ref 0.7–4.0)
Lymphocytes Relative: 40.2 % (ref 12.0–46.0)
MCHC: 34.5 g/dL (ref 30.0–36.0)
MCV: 90.9 fl (ref 78.0–100.0)
MONO ABS: 0.4 10*3/uL (ref 0.1–1.0)
MONOS PCT: 7.5 % (ref 3.0–12.0)
NEUTROS ABS: 2.7 10*3/uL (ref 1.4–7.7)
NEUTROS PCT: 50.1 % (ref 43.0–77.0)
PLATELETS: 150 10*3/uL (ref 150.0–400.0)
RBC: 4.96 Mil/uL (ref 4.22–5.81)
RDW: 12.9 % (ref 11.5–15.5)
WBC: 5.4 10*3/uL (ref 4.0–10.5)

## 2017-08-27 LAB — HEPATIC FUNCTION PANEL
ALK PHOS: 35 U/L — AB (ref 39–117)
ALT: 26 U/L (ref 0–53)
AST: 16 U/L (ref 0–37)
Albumin: 4.4 g/dL (ref 3.5–5.2)
BILIRUBIN DIRECT: 0.1 mg/dL (ref 0.0–0.3)
BILIRUBIN TOTAL: 0.6 mg/dL (ref 0.2–1.2)
TOTAL PROTEIN: 6.7 g/dL (ref 6.0–8.3)

## 2017-08-27 LAB — PSA: PSA: 1.29 ng/mL (ref 0.10–4.00)

## 2017-08-27 LAB — URINALYSIS, ROUTINE W REFLEX MICROSCOPIC
Bilirubin Urine: NEGATIVE
Hgb urine dipstick: NEGATIVE
KETONES UR: NEGATIVE
Leukocytes, UA: NEGATIVE
Nitrite: NEGATIVE
PH: 7 (ref 5.0–8.0)
RBC / HPF: NONE SEEN (ref 0–?)
SPECIFIC GRAVITY, URINE: 1.015 (ref 1.000–1.030)
Total Protein, Urine: NEGATIVE
URINE GLUCOSE: NEGATIVE
UROBILINOGEN UA: 0.2 (ref 0.0–1.0)

## 2017-08-27 LAB — LIPID PANEL
CHOL/HDL RATIO: 4
Cholesterol: 143 mg/dL (ref 0–200)
HDL: 39.5 mg/dL (ref >39.00)
LDL CALC: 81 mg/dL (ref 0–99)
NONHDL: 103.23
TRIGLYCERIDES: 109 mg/dL (ref 0.0–149.0)
VLDL: 21.8 mg/dL (ref 0.0–40.0)

## 2017-08-27 LAB — TSH: TSH: 1.94 u[IU]/mL (ref 0.35–4.50)

## 2017-08-27 NOTE — Progress Notes (Signed)
Subjective:    Patient ID: Francisco Lopez, male    DOB: 1961-05-24, 58 y.o.   MRN: [id number]  HPI   [name] is a 57 yr old male who presents today for cpx.  Patient presents today for complete physical.  Immunizations: tdap 2013, flu shot up to date Diet:  Improved Wt Readings from Last 3 Encounters:  08/27/17 188 lb 12.8 oz (85.6 kg)  06/26/17 187 lb 12.8 oz (85.2 kg)  04/02/17 168 lb (76.2 kg)  Exercise: enjoys hiking/walking Colonoscopy: 2013 (was done in [place]) Reports clean  Vision: 2018 Dental: up to date    Review of Systems  Constitutional: Negative for unexpected weight change.  HENT: Negative for hearing loss and rhinorrhea.   Eyes: Negative for visual disturbance.  Respiratory: Negative for cough.   Cardiovascular: Negative for leg swelling.  Gastrointestinal: Negative for blood in stool, constipation, diarrhea, nausea and vomiting.  Genitourinary: Negative for dysuria, frequency and hematuria.  Musculoskeletal:       Intermittent left shoulder pain  Neurological: Negative for headaches.  Hematological: Negative for adenopathy.  Psychiatric/Behavioral:       Denies depression/anxiety       Past Medical History:  Diagnosis Date  . Anxiety   . Basal cell carcinoma 2006  . Depression   . Diastolic dysfunction 1/95/0932   Per 2D echo 8/16  . Frozen shoulder 2018   left  . Herpes simplex type 1 antibody positive 01/28/2015  . Herpes simplex type 2 infection 01/28/2015  . History of chicken pox   . Hyperlipidemia   . Hypertension      Social History   Socioeconomic History  . Marital status: Legally Separated    Spouse name: Not on file  . Number of children: Not on file  . Years of education: Not on file  . Highest education level: Not on file  Social Needs  . Financial resource strain: Not on file  . Food insecurity - worry: Not on file  . Food insecurity - inability: Not on file  . Transportation needs - medical: Not on file  .  Transportation needs - non-medical: Not on file  Occupational History  . Not on file  Tobacco Use  . Smoking status: Never Smoker  . Smokeless tobacco: Never Used  Substance and Sexual Activity  . Alcohol use: No  . Drug use: No  . Sexual activity: Yes  Other Topics Concern  . 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 KNEE SURGERY Left 2000   meninscus repair    Family History  Problem Relation Age of Onset  . Diabetes Mother        type II  . Anxiety disorder Mother   . Hypertension Father   . Alcohol abuse Father     No Known Allergies  Current Outpatient Medications on File Prior to Visit  Medication Sig Dispense Refill  . amlodipine-atorvastatin (CADUET) 10-20 MG tablet TAKE 1 TABLET BY MOUTH EVERY DAY 90 tablet 1  . divalproex (DEPAKOTE) 250 MG DR tablet TAKE 2 TO 3 TABLETS BY MOUTH EVERY EVENING  12  . fenofibrate (TRICOR) 145 MG tablet TAKE 1 TABLET (145 MG TOTAL) BY MOUTH DAILY. 30 tablet 5  . fluticasone (FLONASE) 50 MCG/ACT nasal spray SPRAY TWICE INTO  EACH NOSTRIL EVERY DAY 16 g 3  . Krill Oil 1000 MG CAPS Take by mouth.    . meloxicam (MOBIC) 7.5 MG tablet Take 1 tablet (7.5 mg total) by mouth daily. 14 tablet 0  . Multiple Vitamins-Minerals (MENS MULTIVITAMIN PLUS PO) Take by mouth.    Marland Kitchen omeprazole (PRILOSEC) 40 MG capsule TAKE 1 CAPSULE BY MOUTH EVERY DAY 30 capsule 3  . QUEtiapine (SEROQUEL) 50 MG tablet Take 50-100 mg by mouth at bedtime.    . tadalafil (CIALIS) 5 MG tablet Take 5 mg by mouth daily as needed.    . traZODone (DESYREL) 50 MG tablet TAKE 1 TO 3 TABLETS BY MOUTH AT BEDTIME  11  . valACYclovir (VALTREX) 500 MG tablet TAKE 1 TABLET (500 MG TOTAL) BY MOUTH DAILY. 30 tablet 5   No current facility-administered medications on file prior to visit.     BP 134/75 (BP  Location: Right Arm, Cuff Size: Large)   Pulse 60   Temp 97.8 F (36.6 C) (Oral)   Resp 16   Ht 5\' 11"  (1.803 m)   Wt 188 lb 12.8 oz (85.6 kg)   SpO2 99%   BMI 26.33 kg/m    Objective:   Physical Exam   Physical Exam  Constitutional: He is oriented to person, place, and time. He appears well-developed and well-nourished. No distress.  HENT:  Head: Normocephalic and atraumatic.  Right Ear: Tympanic membrane and ear canal normal.  Left Ear: Tympanic membrane and ear canal normal.  Mouth/Throat: Oropharynx is clear and moist.  Eyes: Pupils are equal, round, and reactive to light. No scleral icterus.  Neck: Normal range of motion. No thyromegaly present.  Cardiovascular: Normal rate and regular rhythm.   No murmur heard. Pulmonary/Chest: Effort normal and breath sounds normal. No respiratory distress. He has no wheezes. He has no rales. He exhibits no tenderness.  Abdominal: Soft. Bowel sounds are normal. He exhibits no distension and no mass. There is no tenderness. There is no rebound and no guarding.  Musculoskeletal: He exhibits no edema.  Lymphadenopathy:    He has no cervical adenopathy.  Neurological: He is alert and oriented to person, place, and time. He has normal patellar reflexes. He exhibits normal muscle tone. Coordination normal.  Skin: Skin is warm and dry.  Psychiatric: He has a normal mood and affect. His behavior is normal. Judgment and thought content normal.           Assessment & Plan:   Preventative care- discussed continuing healthy diet and regular exercise. Immunizations reviewed and up to date. Obtain routine lab work including PSA.  (discussed pros/cons).      Assessment & Plan:  EKG tracing is personally reviewed.  EKG notes NSR.  No acute changes.

## 2017-08-27 NOTE — Patient Instructions (Signed)
Please complete lab work prior to leaving.   

## 2017-08-29 ENCOUNTER — Encounter: Payer: Self-pay | Admitting: Family

## 2017-10-14 ENCOUNTER — Other Ambulatory Visit: Payer: Self-pay | Admitting: Family

## 2017-10-16 DIAGNOSIS — F3181 Bipolar II disorder: Secondary | ICD-10-CM | POA: Diagnosis not present

## 2017-10-16 DIAGNOSIS — F4323 Adjustment disorder with mixed anxiety and depressed mood: Secondary | ICD-10-CM | POA: Diagnosis not present

## 2017-11-09 DIAGNOSIS — F4323 Adjustment disorder with mixed anxiety and depressed mood: Secondary | ICD-10-CM | POA: Diagnosis not present

## 2017-11-09 DIAGNOSIS — F3112 Bipolar disorder, current episode manic without psychotic features, moderate: Secondary | ICD-10-CM | POA: Diagnosis not present

## 2017-11-12 ENCOUNTER — Other Ambulatory Visit: Payer: Self-pay | Admitting: Family

## 2017-11-16 DIAGNOSIS — F4323 Adjustment disorder with mixed anxiety and depressed mood: Secondary | ICD-10-CM | POA: Diagnosis not present

## 2017-11-16 DIAGNOSIS — F3112 Bipolar disorder, current episode manic without psychotic features, moderate: Secondary | ICD-10-CM | POA: Diagnosis not present

## 2017-11-23 DIAGNOSIS — F3112 Bipolar disorder, current episode manic without psychotic features, moderate: Secondary | ICD-10-CM | POA: Diagnosis not present

## 2017-11-23 DIAGNOSIS — F4323 Adjustment disorder with mixed anxiety and depressed mood: Secondary | ICD-10-CM | POA: Diagnosis not present

## 2017-12-07 DIAGNOSIS — F4323 Adjustment disorder with mixed anxiety and depressed mood: Secondary | ICD-10-CM | POA: Diagnosis not present

## 2017-12-07 DIAGNOSIS — F3112 Bipolar disorder, current episode manic without psychotic features, moderate: Secondary | ICD-10-CM | POA: Diagnosis not present

## 2017-12-10 ENCOUNTER — Other Ambulatory Visit: Payer: Self-pay | Admitting: Family

## 2017-12-11 ENCOUNTER — Telehealth: Payer: Self-pay | Admitting: Family

## 2017-12-11 MED ORDER — MELOXICAM 7.5 MG PO TABS: 7.5000 mg | ORAL_TABLET | Freq: Every day | ORAL | 0 refills | Status: DC

## 2017-12-11 NOTE — Telephone Encounter (Signed)
Sent to Melissa  

## 2017-12-11 NOTE — Telephone Encounter (Signed)
Copied from [place] [id number]. Topic: Quick Communication - See Telephone Encounter >> Dec 11, 2017  1:10 PM Conception Chancy, NT wrote: patient is calling and states his shoulder is really bothering him and can not sleep well. He sees his specialist on 12/20/17. He would like to know is it something Melissa can call in for him to give him some relief until appointment.  [id number] [place], [place] - [address] [place] [address] Phone: [phone] Fax: [phone]

## 2017-12-11 NOTE — Telephone Encounter (Signed)
I sent a refill [facility] to [facility].

## 2017-12-12 NOTE — Telephone Encounter (Signed)
Notified pt and he voices understanding. 

## 2017-12-20 ENCOUNTER — Encounter: Payer: Self-pay | Admitting: Family Medicine

## 2017-12-20 ENCOUNTER — Ambulatory Visit: Payer: BLUE CROSS/BLUE SHIELD | Admitting: Family Medicine

## 2017-12-20 VITALS — BP 126/78 | HR 68 | Ht 70.0 in | Wt 182.0 lb

## 2017-12-20 DIAGNOSIS — M25512 Pain in left shoulder: Secondary | ICD-10-CM

## 2017-12-20 MED ORDER — HYDROCODONE-ACETAMINOPHEN 5-325 MG PO TABS: 1.0000 | ORAL_TABLET | Freq: Four times a day (QID) | ORAL | 0 refills | Status: DC | PRN

## 2017-12-20 NOTE — Patient Instructions (Signed)
We will refer you to Dr. Mardelle Matte to discuss possible surgery for your shoulder. Try aleve 2 tabs twice a day with food. Norco as needed before bedtime. Follow up with me as needed though.

## 2017-12-23 ENCOUNTER — Encounter: Payer: Self-pay | Admitting: Family Medicine

## 2017-12-23 NOTE — Assessment & Plan Note (Signed)
Exam reassuring but after o'brien's test on exam his shoulder was aching much worse.  He didn't improve with physical therapy, subacromial and intraarticular injections (latter gave a few months improvement but not long-lasting).  MRI showed labral tears, tendinosis, adhesive capsulitis (no evidence of the latter now).  Advised we go ahead with ortho referral at this point.  Aleve with norco as needed.

## 2017-12-23 NOTE — Progress Notes (Signed)
PCP and consultation requested by: [name], NP  Subjective:   HPI: Patient is a 57 y.o. male here for left shoulder pain.  9/10: Patient reports about 1 1/2 months ago he started to get pain in lateral left shoulder. No acute injury or trauma. 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 While doing this he gets worse pain in lateral left shoulder. Pain level up to 5/10 and like a toothache but sharp with reaching, overhead motion. Taking meloxicam from PCP which has helped some. No skin changes, numbness.  10/22: Patient reports he's doing better than last visit. Pain level down to 4/10 and sharp lateral shoulder. Worse first thing in the morning and at nighttime. Doing physical therapy and home exercises, feels stronger. No skin changes, numbness.  04/20/17: Patient returns for intraarticular injection after we had discussed MRI. Pain level 4/10 but up to 10/10 yesterday.  12/20/17: Patient reports injection only helped his left shoulder pain for a couple months. Pain has been progressively getting severe especially the past 3 months. Pain level 8-10/10 and achy. Worse at night and at work though his movement has improved. No numbness.  Past Medical History:  Diagnosis Date  . Anxiety   . Basal cell carcinoma 2006  . Depression   . Diastolic dysfunction 12/11/6376   Per 2D echo 8/16  . Frozen shoulder 2018   left  . Herpes simplex type 1 antibody positive 01/28/2015  . Herpes simplex type 2 infection 01/28/2015  . History of chicken pox   . Hyperlipidemia   . Hypertension     Current Outpatient Medications on File Prior to Visit  Medication Sig Dispense Refill  . amlodipine-atorvastatin (CADUET) 10-20 MG tablet TAKE 1 TABLET BY MOUTH EVERY DAY 90 tablet 1  . divalproex (DEPAKOTE) 250 MG DR tablet TAKE 2 TO 3 TABLETS BY MOUTH EVERY EVENING  12  . fenofibrate (TRICOR) 145 MG tablet TAKE 1 TABLET (145 MG TOTAL) BY MOUTH DAILY. 30 tablet 5   . fluticasone (FLONASE) 50 MCG/ACT nasal spray SPRAY TWICE INTO EACH NOSTRIL EVERY DAY 16 g 3  . Krill Oil 1000 MG CAPS Take by mouth.    . meloxicam (MOBIC) 7.5 MG tablet Take 1 tablet (7.5 mg total) by mouth daily. 14 tablet 0  . Multiple Vitamins-Minerals (MENS MULTIVITAMIN PLUS PO) Take by mouth.    Marland Kitchen omeprazole (PRILOSEC) 40 MG capsule TAKE 1 CAPSULE BY MOUTH EVERY DAY 30 capsule 3  . QUEtiapine (SEROQUEL) 200 MG tablet TAKE 1 TO 2 TABLETS BY MOUTH EVERY EVENING AT BEDTIME  12  . tadalafil (CIALIS) 5 MG tablet Take 5 mg by mouth daily as needed.    . traZODone (DESYREL) 50 MG tablet TAKE 1 TO 3 TABLETS BY MOUTH AT BEDTIME  11  . valACYclovir (VALTREX) 500 MG tablet TAKE 1 TABLET (500 MG TOTAL) BY MOUTH DAILY. 30 tablet 5   No current facility-administered medications on file prior to visit.     Past Surgical History:  Procedure Laterality Date  . KNEE SURGERY Left 2000   meninscus repair    No Known Allergies  Social History   Socioeconomic History  . Marital status: Legally Separated    Spouse name: Not on file  . Number of children: Not on file  . Years of education: Not on file  . Highest education level: Not on file  Occupational History  . Not on file  Social Needs  . Financial resource strain: Not on file  .  Food insecurity:    Worry: Not on file    Inability: Not on file  . Transportation needs:    Medical: Not on file    Non-medical: Not on file  Tobacco Use  . Smoking status: Never Smoker  . Smokeless tobacco: Never Used  Substance and Sexual Activity  . Alcohol use: No  . Drug use: No  . Sexual activity: Yes  Lifestyle  . Physical activity:    Days per week: Not on file    Minutes per session: Not on file  . Stress: Not on file  Relationships  . Social connections:    Talks on phone: Not on file    Gets together: Not on file    Attends religious service: Not on file    Active member of club or organization: Not on file    Attends meetings of  clubs or organizations: Not on file    Relationship status: Not on file  . Intimate partner violence:    Fear of current or ex partner: Not on file    Emotionally abused: Not on file    Physically abused: Not on file    Forced sexual activity: Not on file  Other Topics Concern  . 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 Diabetes Mother        type II  . Anxiety disorder Mother   . Hypertension Father   . Alcohol abuse Father     BP 126/78   Pulse 68   Ht 5\' 10"  (1.778 m)   Wt 182 lb (82.6 kg)   BMI 26.11 kg/m   Review of Systems: See HPI above.     Objective:  Physical Exam:  Gen: NAD, comfortable in exam room  Left shoulder: No swelling, ecchymoses.  No gross deformity. No TTP. FROM. Negative Hawkins, Neers. Negative Yergasons. Strength 5/5 with empty can and resisted internal/external rotation. Negative apprehension. Negative o'briens. NV intact distally.   Assessment & Plan:  1. Left shoulder pain - Exam reassuring but after o'brien's test on exam his shoulder was aching much worse.  He didn't improve with physical therapy, subacromial and intraarticular injections (latter gave a few months improvement but not long-lasting).  MRI showed labral tears, tendinosis, adhesive capsulitis (no evidence of the latter now).  Advised we go ahead with ortho referral at this point.  Aleve with norco as needed.

## 2017-12-28 DIAGNOSIS — F4323 Adjustment disorder with mixed anxiety and depressed mood: Secondary | ICD-10-CM | POA: Diagnosis not present

## 2017-12-31 DIAGNOSIS — M19012 Primary osteoarthritis, left shoulder: Secondary | ICD-10-CM | POA: Diagnosis not present

## 2018-01-03 ENCOUNTER — Telehealth: Payer: Self-pay | Admitting: Family Medicine

## 2018-01-03 MED ORDER — HYDROCODONE-ACETAMINOPHEN 5-325 MG PO TABS: 1.0000 | ORAL_TABLET | Freq: Four times a day (QID) | ORAL | 0 refills | Status: DC | PRN

## 2018-01-03 NOTE — Telephone Encounter (Signed)
Notes reviewed from Dr. Mardelle Matte.  Sent in refill of 20 tabs after reviewing database.  Would ask him to discuss further medications with Dr. Mardelle Matte should he need it just before and after his surgery.  Thanks!

## 2018-01-03 NOTE — Telephone Encounter (Signed)
I'll have to take a look at his note first - we've contacted Dr. Luanna Cole office and it wasn't ready yet.

## 2018-01-03 NOTE — Telephone Encounter (Signed)
Patient called requesting a refill of Norco. States his surgery is scheduled for August 8th.

## 2018-01-04 NOTE — Telephone Encounter (Signed)
Left detailed message informing patient.   DPR on file allowing a detailed message to be left.

## 2018-01-10 HISTORY — PX: SHOULDER ARTHROSCOPY: SHX128

## 2018-01-11 DIAGNOSIS — F4323 Adjustment disorder with mixed anxiety and depressed mood: Secondary | ICD-10-CM | POA: Diagnosis not present

## 2018-01-17 DIAGNOSIS — X58XXXA Exposure to other specified factors, initial encounter: Secondary | ICD-10-CM | POA: Diagnosis not present

## 2018-01-17 DIAGNOSIS — M24112 Other articular cartilage disorders, left shoulder: Secondary | ICD-10-CM | POA: Diagnosis not present

## 2018-01-17 DIAGNOSIS — S43492A Other sprain of left shoulder joint, initial encounter: Secondary | ICD-10-CM | POA: Diagnosis not present

## 2018-01-17 DIAGNOSIS — M19012 Primary osteoarthritis, left shoulder: Secondary | ICD-10-CM | POA: Diagnosis not present

## 2018-01-17 DIAGNOSIS — Y929 Unspecified place or not applicable: Secondary | ICD-10-CM | POA: Diagnosis not present

## 2018-01-17 DIAGNOSIS — G8918 Other acute postprocedural pain: Secondary | ICD-10-CM | POA: Diagnosis not present

## 2018-01-17 DIAGNOSIS — Y999 Unspecified external cause status: Secondary | ICD-10-CM | POA: Diagnosis not present

## 2018-01-17 DIAGNOSIS — Y939 Activity, unspecified: Secondary | ICD-10-CM | POA: Diagnosis not present

## 2018-01-21 DIAGNOSIS — N401 Enlarged prostate with lower urinary tract symptoms: Secondary | ICD-10-CM | POA: Diagnosis not present

## 2018-01-21 DIAGNOSIS — Z125 Encounter for screening for malignant neoplasm of prostate: Secondary | ICD-10-CM | POA: Diagnosis not present

## 2018-01-21 DIAGNOSIS — N138 Other obstructive and reflux uropathy: Secondary | ICD-10-CM | POA: Diagnosis not present

## 2018-01-28 DIAGNOSIS — N138 Other obstructive and reflux uropathy: Secondary | ICD-10-CM | POA: Diagnosis not present

## 2018-01-28 DIAGNOSIS — N401 Enlarged prostate with lower urinary tract symptoms: Secondary | ICD-10-CM | POA: Diagnosis not present

## 2018-01-28 DIAGNOSIS — N529 Male erectile dysfunction, unspecified: Secondary | ICD-10-CM | POA: Diagnosis not present

## 2018-01-30 DIAGNOSIS — M19012 Primary osteoarthritis, left shoulder: Secondary | ICD-10-CM | POA: Diagnosis not present

## 2018-02-01 DIAGNOSIS — F3112 Bipolar disorder, current episode manic without psychotic features, moderate: Secondary | ICD-10-CM | POA: Diagnosis not present

## 2018-02-01 DIAGNOSIS — F4323 Adjustment disorder with mixed anxiety and depressed mood: Secondary | ICD-10-CM | POA: Diagnosis not present

## 2018-02-13 ENCOUNTER — Other Ambulatory Visit: Payer: Self-pay | Admitting: Family

## 2018-02-15 DIAGNOSIS — F4323 Adjustment disorder with mixed anxiety and depressed mood: Secondary | ICD-10-CM | POA: Diagnosis not present

## 2018-02-15 DIAGNOSIS — F3112 Bipolar disorder, current episode manic without psychotic features, moderate: Secondary | ICD-10-CM | POA: Diagnosis not present

## 2018-03-01 DIAGNOSIS — F4323 Adjustment disorder with mixed anxiety and depressed mood: Secondary | ICD-10-CM | POA: Diagnosis not present

## 2018-03-04 ENCOUNTER — Encounter: Payer: Self-pay | Admitting: Family

## 2018-03-04 ENCOUNTER — Ambulatory Visit (INDEPENDENT_AMBULATORY_CARE_PROVIDER_SITE_OTHER): Payer: BLUE CROSS/BLUE SHIELD | Admitting: Family

## 2018-03-04 DIAGNOSIS — B009 Herpesviral infection, unspecified: Secondary | ICD-10-CM | POA: Diagnosis not present

## 2018-03-04 DIAGNOSIS — K219 Gastro-esophageal reflux disease without esophagitis: Secondary | ICD-10-CM | POA: Diagnosis not present

## 2018-03-04 DIAGNOSIS — E785 Hyperlipidemia, unspecified: Secondary | ICD-10-CM | POA: Diagnosis not present

## 2018-03-04 DIAGNOSIS — Z23 Encounter for immunization: Secondary | ICD-10-CM | POA: Diagnosis not present

## 2018-03-04 DIAGNOSIS — M19012 Primary osteoarthritis, left shoulder: Secondary | ICD-10-CM | POA: Diagnosis not present

## 2018-03-04 DIAGNOSIS — I10 Essential (primary) hypertension: Secondary | ICD-10-CM

## 2018-03-04 LAB — BASIC METABOLIC PANEL
BUN: 18 mg/dL (ref 6–23)
CHLORIDE: 105 meq/L (ref 96–112)
CO2: 25 mEq/L (ref 19–32)
Calcium: 9.6 mg/dL (ref 8.4–10.5)
Creatinine, Ser: 0.95 mg/dL (ref 0.40–1.50)
GFR: 86.78 mL/min (ref >60.00)
Glucose, Bld: 95 mg/dL (ref 70–99)
POTASSIUM: 4.3 meq/L (ref 3.5–5.1)
Sodium: 139 mEq/L (ref 135–145)

## 2018-03-04 NOTE — Progress Notes (Signed)
Subjective:    Patient ID: Francisco Lopez, male    DOB: Jan 06, 1961, 57 y.o.   MRN: [id number]  HPI  HTN- continues caduet.  BP Readings from Last 3 Encounters:  03/04/18 136/86  12/20/17 126/78  08/27/17 134/75   Hyperlipdemia- on atorvastain and tricor, denies myalgia Lab Results  Component Value Date   CHOL 143 08/27/2017   HDL 39.50 08/27/2017   LDLCALC 81 08/27/2017   LDLDIRECT 97.0 12/27/2015   TRIG 109.0 08/27/2017   CHOLHDL 4 08/27/2017   Recently completed shoulder surgery and his pain is improved.   GERD-  Maintained on omeprazole, reports that he watches what he eats.  HSV- uses daily valtrex, no breakout.   Review of Systems See HPI  Past Medical History:  Diagnosis Date  . Anxiety   . Basal cell carcinoma 2006  . Depression   . Diastolic dysfunction 11/23/4313   Per 2D echo 8/16  . Frozen shoulder 2018   left  . Herpes simplex type 1 antibody positive 01/28/2015  . Herpes simplex type 2 infection 01/28/2015  . History of chicken pox   . Hyperlipidemia   . Hypertension      Social History   Socioeconomic History  . Marital status: Legally Separated    Spouse name: Not on file  . Number of children: Not on file  . Years of education: Not on file  . Highest education level: Not on file  Occupational History  . Not on file  Social Needs  . Financial resource strain: Not on file  . Food insecurity:    Worry: Not on file    Inability: Not on file  . Transportation needs:    Medical: Not on file    Non-medical: Not on file  Tobacco Use  . Smoking status: Never Smoker  . Smokeless tobacco: Never Used  Substance and Sexual Activity  . Alcohol use: No  . Drug use: No  . Sexual activity: Yes  Lifestyle  . Physical activity:    Days per week: Not on file    Minutes per session: Not on file  . Stress: Not on file  Relationships  . Social connections:    Talks on phone: Not on file    Gets together: Not on file    Attends religious service:  Not on file    Active member of club or organization: Not on file    Attends meetings of clubs or organizations: Not on file    Relationship status: Not on file  . Intimate partner violence:    Fear of current or ex partner: Not on file    Emotionally abused: Not on file    Physically abused: Not on file    Forced sexual activity: Not on file  Other Topics Concern  . 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 KNEE SURGERY Left 2000   meninscus repair  . SHOULDER ARTHROSCOPY Left 01/2018    Family History  Problem Relation Age of Onset  . Diabetes Mother        type II  . Anxiety disorder Mother   . Hypertension Father   . Alcohol abuse Father     No Known Allergies  Current Outpatient Medications on File Prior to Visit  Medication Sig  Dispense Refill  . amlodipine-atorvastatin (CADUET) 10-20 MG tablet TAKE 1 TABLET BY MOUTH EVERY DAY 90 tablet 1  . divalproex (DEPAKOTE) 250 MG DR tablet TAKE 2 TO 3 TABLETS BY MOUTH EVERY EVENING  12  . fenofibrate (TRICOR) 145 MG tablet TAKE 1 TABLET (145 MG TOTAL) BY MOUTH DAILY. 30 tablet 5  . fluticasone (FLONASE) 50 MCG/ACT nasal spray SPRAY TWICE INTO EACH NOSTRIL EVERY DAY 16 g 3  . Krill Oil 1000 MG CAPS Take by mouth.    . Multiple Vitamins-Minerals (MENS MULTIVITAMIN PLUS PO) Take by mouth.    Marland Kitchen omeprazole (PRILOSEC) 40 MG capsule TAKE 1 CAPSULE BY MOUTH EVERY DAY 30 capsule 5  . QUEtiapine (SEROQUEL) 200 MG tablet TAKE 1 TO 2 TABLETS BY MOUTH EVERY EVENING AT BEDTIME  12  . tadalafil (CIALIS) 5 MG tablet Take 5 mg by mouth daily as needed.    . traZODone (DESYREL) 50 MG tablet TAKE 1 TO 3 TABLETS BY MOUTH AT BEDTIME  11  . valACYclovir (VALTREX) 500 MG tablet TAKE 1 TABLET (500 MG TOTAL) BY MOUTH DAILY. 30 tablet 5   No current facility-administered  medications on file prior to visit.     BP 136/86 (BP Location: Right Arm, Cuff Size: Normal)   Pulse 69   Temp 98 F (36.7 C) (Oral)   Resp 16   Ht 5\' 11"  (1.803 m)   Wt 186 lb 3.2 oz (84.5 kg)   SpO2 98%   BMI 25.97 kg/m       Objective:   Physical Exam  Constitutional: He is oriented to person, place, and time. He appears well-developed and well-nourished. No distress.  HENT:  Head: Normocephalic and atraumatic.  Right Ear: Tympanic membrane and ear canal normal.  Left TM occluded by cerumen  Cardiovascular: Normal rate and regular rhythm.  No murmur heard. Pulmonary/Chest: Effort normal and breath sounds normal. No respiratory distress. He has no wheezes. He has no rales.  Musculoskeletal: He exhibits no edema.  Neurological: He is alert and oriented to person, place, and time.  Skin: Skin is warm and dry.  Psychiatric: He has a normal mood and affect. His behavior is normal. Thought content normal.          Assessment & Plan:  HTN- BP stable on current meds obtain follow up bmet.  GERD- stable on PPI, continue same.  HSV- stable on suppressive valtrex.  Hyperlipidemia- at goal, continue current meds.  Flu shot today.

## 2018-03-04 NOTE — Patient Instructions (Signed)
Please complete lab work prior to leaving.   

## 2018-03-06 NOTE — Progress Notes (Signed)
Mailed out to pt 

## 2018-03-15 DIAGNOSIS — F3112 Bipolar disorder, current episode manic without psychotic features, moderate: Secondary | ICD-10-CM | POA: Diagnosis not present

## 2018-03-15 DIAGNOSIS — F4323 Adjustment disorder with mixed anxiety and depressed mood: Secondary | ICD-10-CM | POA: Diagnosis not present

## 2018-04-09 DIAGNOSIS — F3189 Other bipolar disorder: Secondary | ICD-10-CM | POA: Diagnosis not present

## 2018-04-12 DIAGNOSIS — F4323 Adjustment disorder with mixed anxiety and depressed mood: Secondary | ICD-10-CM | POA: Diagnosis not present

## 2018-04-26 DIAGNOSIS — F3112 Bipolar disorder, current episode manic without psychotic features, moderate: Secondary | ICD-10-CM | POA: Diagnosis not present

## 2018-04-26 DIAGNOSIS — F4323 Adjustment disorder with mixed anxiety and depressed mood: Secondary | ICD-10-CM | POA: Diagnosis not present

## 2018-05-24 DIAGNOSIS — F4323 Adjustment disorder with mixed anxiety and depressed mood: Secondary | ICD-10-CM | POA: Diagnosis not present

## 2018-06-02 ENCOUNTER — Other Ambulatory Visit: Payer: Self-pay | Admitting: Family

## 2018-06-14 DIAGNOSIS — F3112 Bipolar disorder, current episode manic without psychotic features, moderate: Secondary | ICD-10-CM | POA: Diagnosis not present

## 2018-06-14 DIAGNOSIS — F4323 Adjustment disorder with mixed anxiety and depressed mood: Secondary | ICD-10-CM | POA: Diagnosis not present

## 2018-07-29 DIAGNOSIS — M19012 Primary osteoarthritis, left shoulder: Secondary | ICD-10-CM | POA: Diagnosis not present

## 2018-09-02 ENCOUNTER — Ambulatory Visit: Payer: Self-pay | Admitting: Family

## 2018-09-04 ENCOUNTER — Other Ambulatory Visit: Payer: Self-pay | Admitting: Family

## 2018-09-06 ENCOUNTER — Other Ambulatory Visit: Payer: Self-pay

## 2018-09-06 ENCOUNTER — Ambulatory Visit (INDEPENDENT_AMBULATORY_CARE_PROVIDER_SITE_OTHER): Payer: BLUE CROSS/BLUE SHIELD | Admitting: Family

## 2018-09-06 ENCOUNTER — Other Ambulatory Visit: Payer: Self-pay | Admitting: Family

## 2018-09-06 DIAGNOSIS — K219 Gastro-esophageal reflux disease without esophagitis: Secondary | ICD-10-CM | POA: Diagnosis not present

## 2018-09-06 DIAGNOSIS — E785 Hyperlipidemia, unspecified: Secondary | ICD-10-CM

## 2018-09-06 DIAGNOSIS — I10 Essential (primary) hypertension: Secondary | ICD-10-CM

## 2018-09-06 DIAGNOSIS — B009 Herpesviral infection, unspecified: Secondary | ICD-10-CM

## 2018-09-06 DIAGNOSIS — F3170 Bipolar disorder, currently in remission, most recent episode unspecified: Secondary | ICD-10-CM

## 2018-09-06 DIAGNOSIS — E1169 Type 2 diabetes mellitus with other specified complication: Secondary | ICD-10-CM

## 2018-09-06 MED ORDER — OMEPRAZOLE 20 MG PO CPDR: 20.0000 mg | DELAYED_RELEASE_CAPSULE | Freq: Every day | ORAL | 1 refills | Status: DC

## 2018-09-06 NOTE — Progress Notes (Signed)
Virtual Visit via Video Note  I connected with Francisco Lopez on 09/06/18 at  3:30 PM EDT by a video enabled telemedicine application and verified that I am speaking with the correct person using two identifiers.   I discussed the limitations of evaluation and management by telemedicine and the availability of in person appointments. The patient expressed understanding and agreed to proceed.  History of Present Illness: Patient presents today for a virtual video visit for 6 month follow up.  HTN- has a bp cuff but can't find it.  Continues Caduet.   Hyperlipidemia-on caduet and tricor.  Reports that he is watching his diet. reprots weight is 184-185 Wt Readings from Last 3 Encounters:  03/04/18 186 lb 3.2 oz (84.5 kg)  12/20/17 182 lb (82.6 kg)  08/27/17 188 lb 12.8 oz (85.6 kg)    Lab Results  Component Value Date   CHOL 143 08/27/2017   HDL 39.50 08/27/2017   LDLCALC 81 08/27/2017   LDLDIRECT 97.0 12/27/2015   TRIG 109.0 08/27/2017   CHOLHDL 4 08/27/2017   HSV- Continues valtex daily.  Denies any recent breakouts.   - uses daily cialis. Marland Kitchen He is taking daily.    Shoulder pain- saw sports med.  They think he needs shoulder replacement.  Using meloxicam.   Bipolar disorder- on seroquel and depakote. This is being managed by psychiatry. He is followed Dr. Toy Care and sees her q6 months.   GERD- maintained on omeprazole. Reports symptoms are well controlled.    Observations/Objective:  Gen: awake, alert NAD Resp: breathing is even and non-labored. Psych: calm, pleasant affect Neuro: A and O x 3, speech is clear, + facial symmetry.   Assessment and Plan:  GERD- stable. Will try to decrease omeprazole from 40mg  to 20mg .  Bipolar disorder- stable, management per psych.  HSV- stable on daily valtrex. Continue same.  Hyperlipidemia- continue current meds. He will schedule a lab visit in about a month when COVID hopefully calms down.   HTN- he will check bp and send his  reading via mychart.  Follow Up Instructions:   I advised follow up lab in about 1 month, cpx in 3 months if we a I discussed the assessment and treatment plan with the patient. The patient was provided an opportunity to ask questions and all were answered. The patient agreed with the plan and demonstrated an understanding of the instructions.   The patient was advised to call back or seek an in-person evaluation if the symptoms worsen or if the condition fails to improve as anticipated.  I provided 20 minutes of non-face-to-face time during this encounter.   [name], NP

## 2018-09-27 ENCOUNTER — Ambulatory Visit (INDEPENDENT_AMBULATORY_CARE_PROVIDER_SITE_OTHER): Payer: BLUE CROSS/BLUE SHIELD | Admitting: Family

## 2018-09-27 ENCOUNTER — Telehealth: Payer: Self-pay

## 2018-09-27 ENCOUNTER — Encounter: Payer: Self-pay | Admitting: Family

## 2018-09-27 ENCOUNTER — Other Ambulatory Visit: Payer: Self-pay

## 2018-09-27 DIAGNOSIS — S41052A Open bite of left shoulder, initial encounter: Secondary | ICD-10-CM | POA: Diagnosis not present

## 2018-09-27 DIAGNOSIS — W540XXA Bitten by dog, initial encounter: Secondary | ICD-10-CM

## 2018-09-27 MED ORDER — AMOXICILLIN-POT CLAVULANATE 875-125 MG PO TABS: 1.0000 | ORAL_TABLET | Freq: Two times a day (BID) | ORAL | 0 refills | Status: DC

## 2018-09-27 NOTE — Telephone Encounter (Signed)
Patient scheduled for 10:40 today.

## 2018-09-27 NOTE — Progress Notes (Signed)
Virtual Visit via Video Note  I connected with Francisco Lopez on 09/27/18 at 10:40 AM EDT by a video enabled telemedicine application and verified that I am speaking with the correct person using two identifiers. This visit type was conducted due to national recommendations for restrictions regarding the COVID-19 Pandemic (e.g. social distancing).  This format is felt to be most appropriate for this patient at this time.   I discussed the limitations of evaluation and management by telemedicine and the availability of in person appointments. The patient expressed understanding and agreed to proceed.  Only the patient and myself were on today's video visit. The patient was at home and I was at home at the time of today's visit.  The video connection and sound connection was poor so we ultimately ended up transitioning to phone.   History of Present Illness:  Patient reports that he was attacked by a dog and suffered a bite yesterday.  Dog has had all his vaccinations.  Reports that the dog bit him on the back of the left shoulder.  Dog is a 71 pound Pit mix.  He was walking with is girlfriend.  He had his dog in his arm.  Bled through the pad this AM.  Girlfriend reports that the wound does not appear to need stitches.   bp 140/78 pulse 86  Observations/Objective:  Unable to exam due to video limitations  Assessment and Plan:  Dog Bite- New.  I asked the patient to monitor wound and call if increased pain, swelling or drainage. I also asked him to send me a photo via mychart of the wound today.   Follow Up Instructions:    I discussed the assessment and treatment plan with the patient. The patient was provided an opportunity to ask questions and all were answered. The patient agreed with the plan and demonstrated an understanding of the instructions.   The patient was advised to call back or seek an in-person evaluation if the symptoms worsen or if the condition fails to improve as  anticipated.    [name], NP

## 2018-09-27 NOTE — Telephone Encounter (Signed)
Copied from [place] [id number]. Topic: Appointment Scheduling - Scheduling Inquiry for Clinic >> Sep 26, 2018  4:52 PM Rainey Pines A wrote: Reason for CRM: Patient was attacked by a dog and was bitten. Patient would like to s cheudle a visit to reiceve antibiotics.

## 2018-10-01 ENCOUNTER — Encounter: Payer: Self-pay | Admitting: Family

## 2018-11-27 ENCOUNTER — Other Ambulatory Visit: Payer: Self-pay

## 2018-11-27 MED ORDER — FLUTICASONE PROPIONATE 50 MCG/ACT NA SUSP: NASAL | 3 refills | Status: DC

## 2018-12-24 DIAGNOSIS — F3181 Bipolar II disorder: Secondary | ICD-10-CM | POA: Diagnosis not present

## 2019-01-01 ENCOUNTER — Other Ambulatory Visit: Payer: Self-pay

## 2019-01-01 ENCOUNTER — Other Ambulatory Visit (INDEPENDENT_AMBULATORY_CARE_PROVIDER_SITE_OTHER): Payer: BC Managed Care – PPO

## 2019-01-01 DIAGNOSIS — E1169 Type 2 diabetes mellitus with other specified complication: Secondary | ICD-10-CM | POA: Diagnosis not present

## 2019-01-01 DIAGNOSIS — E785 Hyperlipidemia, unspecified: Secondary | ICD-10-CM | POA: Diagnosis not present

## 2019-01-01 LAB — COMPREHENSIVE METABOLIC PANEL
ALT: 22 U/L (ref 0–53)
AST: 15 U/L (ref 0–37)
Albumin: 4.8 g/dL (ref 3.5–5.2)
Alkaline Phosphatase: 38 U/L — ABNORMAL LOW (ref 39–117)
BUN: 14 mg/dL (ref 6–23)
CO2: 26 mEq/L (ref 19–32)
Calcium: 9.8 mg/dL (ref 8.4–10.5)
Chloride: 101 mEq/L (ref 96–112)
Creatinine, Ser: 0.93 mg/dL (ref 0.40–1.50)
GFR: 83.44 mL/min (ref >60.00)
Glucose, Bld: 85 mg/dL (ref 70–99)
Potassium: 4.3 mEq/L (ref 3.5–5.1)
Sodium: 138 mEq/L (ref 135–145)
Total Bilirubin: 0.8 mg/dL (ref 0.2–1.2)
Total Protein: 7 g/dL (ref 6.0–8.3)

## 2019-01-01 LAB — LIPID PANEL
Cholesterol: 166 mg/dL (ref 0–200)
HDL: 44.7 mg/dL (ref >39.00)
LDL Cholesterol: 86 mg/dL (ref 0–99)
NonHDL: 121.6
Total CHOL/HDL Ratio: 4
Triglycerides: 180 mg/dL — ABNORMAL HIGH (ref 0.0–149.0)
VLDL: 36 mg/dL (ref 0.0–40.0)

## 2019-01-07 ENCOUNTER — Other Ambulatory Visit: Payer: Self-pay

## 2019-01-07 ENCOUNTER — Ambulatory Visit (INDEPENDENT_AMBULATORY_CARE_PROVIDER_SITE_OTHER): Payer: BC Managed Care – PPO | Admitting: Family

## 2019-01-07 DIAGNOSIS — F39 Unspecified mood [affective] disorder: Secondary | ICD-10-CM | POA: Diagnosis not present

## 2019-01-07 DIAGNOSIS — N529 Male erectile dysfunction, unspecified: Secondary | ICD-10-CM | POA: Diagnosis not present

## 2019-01-07 DIAGNOSIS — I10 Essential (primary) hypertension: Secondary | ICD-10-CM | POA: Diagnosis not present

## 2019-01-07 DIAGNOSIS — K219 Gastro-esophageal reflux disease without esophagitis: Secondary | ICD-10-CM | POA: Diagnosis not present

## 2019-01-07 DIAGNOSIS — B009 Herpesviral infection, unspecified: Secondary | ICD-10-CM

## 2019-01-07 MED ORDER — MELOXICAM 7.5 MG PO TABS: 7.5000 mg | ORAL_TABLET | Freq: Every day | ORAL | 0 refills | Status: DC | PRN

## 2019-01-07 NOTE — Progress Notes (Signed)
Virtual Visit via Video Note  I connected with Francisco Lopez on 01/07/19 at  5:40 PM EDT by a video enabled telemedicine application and verified that I am speaking with the correct person using two identifiers.  Location: Patient: home Provider: work   I discussed the limitations of evaluation and management by telemedicine and the availability of in person appointments. The patient expressed understanding and agreed to proceed.  History of Present Illness:  Patient is a 58 yr old male who presents today for follow up.   HTN-reports vitals as follows today: bp 139/80 pulse 78. He is maintained on caduet 10-20.    Wt Readings from Last 3 Encounters:  03/04/18 186 lb 3.2 oz (84.5 kg)  12/20/17 182 lb (82.6 kg)  08/27/17 188 lb 12.8 oz (85.6 kg)   HSV- no breakouts. Continues daily breakouts.  Reports Temp 98.4  GERD- reports symptoms have been well controlled on omeprazole 20mg .   Working with psychiatry- maintained on depakote and seroquel and reports good mood.   He follows with Dr. Nevada Crane (urology) for ED. Maintained on prn cialis.   Past Medical History:  Diagnosis Date  . Anxiety   . Basal cell carcinoma 2006  . Depression   . Diastolic dysfunction 11/06/7822   Per 2D echo 8/16  . Frozen shoulder 2018   left  . Herpes simplex type 1 antibody positive 01/28/2015  . Herpes simplex type 2 infection 01/28/2015  . History of chicken pox   . Hyperlipidemia   . Hypertension      Social History   Socioeconomic History  . Marital status: Legally Separated    Spouse name: Not on file  . Number of children: Not on file  . Years of education: Not on file  . Highest education level: Not on file  Occupational History  . Not on file  Social Needs  . Financial resource strain: Not on file  . Food insecurity    Worry: Not on file    Inability: Not on file  . Transportation needs    Medical: Not on file    Non-medical: Not on file  Tobacco Use  . Smoking status: Never  Smoker  . Smokeless tobacco: Never Used  Substance and Sexual Activity  . Alcohol use: No  . Drug use: No  . Sexual activity: Yes  Lifestyle  . Physical activity    Days per week: Not on file    Minutes per session: Not on file  . Stress: Not on file  Relationships  . Social Herbalist on phone: Not on file    Gets together: Not on file    Attends religious service: Not on file    Active member of club or organization: Not on file    Attends meetings of clubs or organizations: Not on file    Relationship status: Not on file  . Intimate partner violence    Fear of current or ex partner: Not on file    Emotionally abused: Not on file    Physically abused: Not on file    Forced sexual activity: Not on file  Other Topics Concern  . 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 KNEE SURGERY Left 2000   meninscus repair  . SHOULDER  ARTHROSCOPY Left 01/2018    Family History  Problem Relation Age of Onset  . Diabetes Mother        type II  . Anxiety disorder Mother   . Hypertension Father   . Alcohol abuse Father     No Known Allergies  Current Outpatient Medications on File Prior to Visit  Medication Sig Dispense Refill  . amlodipine-atorvastatin (CADUET) 10-20 MG tablet TAKE 1 TABLET BY MOUTH EVERY DAY 30 tablet 5  . divalproex (DEPAKOTE) 250 MG DR tablet TAKE 2 TO 3 TABLETS BY MOUTH EVERY EVENING  12  . fenofibrate (TRICOR) 145 MG tablet TAKE 1 TABLET (145 MG TOTAL) BY MOUTH DAILY. 30 tablet 5  . fluticasone (FLONASE) 50 MCG/ACT nasal spray PLACE SPRAY INTO EACH NOSTRIL TWICE A DAY 16 g 3  . Krill Oil 1000 MG CAPS Take by mouth.    . Multiple Vitamins-Minerals (MENS MULTIVITAMIN PLUS PO) Take by mouth.    Marland Kitchen omeprazole (PRILOSEC) 20 MG capsule Take 1 capsule (20 mg total) by mouth  daily. 90 capsule 1  . QUEtiapine (SEROQUEL) 200 MG tablet TAKE 1 TO 2 TABLETS BY MOUTH EVERY EVENING AT BEDTIME  12  . tadalafil (CIALIS) 5 MG tablet Take 5 mg by mouth daily as needed.    . valACYclovir (VALTREX) 500 MG tablet TAKE 1 TABLET (500 MG TOTAL) BY MOUTH DAILY. 30 tablet 5   No current facility-administered medications on file prior to visit.     There were no vitals taken for this visit.    Observations/Objective:   Gen: Awake, alert, no acute distress Resp: Breathing is even and non-labored Psych: calm/pleasant demeanor Neuro: Alert and Oriented x 3, + facial symmetry, speech is clear.   Assessment and Plan:  HTN- bp stable on current medication. Continue same.  HSV2- stable on daily valtrex, continue same.  GERD- stable on PPI.  Continue same.   Mood disorder- stable on current meds- management per psychiatry.  ED- stable on prn cialis- managed by urology.  Follow Up Instructions:    I discussed the assessment and treatment plan with the patient. The patient was provided an opportunity to ask questions and all were answered. The patient agreed with the plan and demonstrated an understanding of the instructions.   The patient was advised to call back or seek an in-person evaluation if the symptoms worsen or if the condition fails to improve as anticipated.  [name], NP

## 2019-02-24 DIAGNOSIS — N138 Other obstructive and reflux uropathy: Secondary | ICD-10-CM | POA: Diagnosis not present

## 2019-02-24 DIAGNOSIS — N401 Enlarged prostate with lower urinary tract symptoms: Secondary | ICD-10-CM | POA: Diagnosis not present

## 2019-03-03 DIAGNOSIS — N138 Other obstructive and reflux uropathy: Secondary | ICD-10-CM | POA: Diagnosis not present

## 2019-03-03 DIAGNOSIS — N529 Male erectile dysfunction, unspecified: Secondary | ICD-10-CM | POA: Diagnosis not present

## 2019-03-03 DIAGNOSIS — N401 Enlarged prostate with lower urinary tract symptoms: Secondary | ICD-10-CM | POA: Diagnosis not present

## 2019-04-16 ENCOUNTER — Ambulatory Visit (INDEPENDENT_AMBULATORY_CARE_PROVIDER_SITE_OTHER): Payer: BC Managed Care – PPO | Admitting: *Deleted

## 2019-04-16 ENCOUNTER — Other Ambulatory Visit: Payer: Self-pay

## 2019-04-16 DIAGNOSIS — Z23 Encounter for immunization: Secondary | ICD-10-CM | POA: Diagnosis not present

## 2019-04-16 NOTE — Progress Notes (Signed)
Patient here for flu vaccine.  Vaccine given in left deltoid and patient tolerated well. 

## 2019-04-29 ENCOUNTER — Other Ambulatory Visit: Payer: Self-pay | Admitting: Family

## 2019-05-02 ENCOUNTER — Other Ambulatory Visit: Payer: Self-pay | Admitting: Family

## 2019-05-05 MED ORDER — MELOXICAM 7.5 MG PO TABS: ORAL_TABLET | ORAL | 0 refills | Status: DC

## 2019-05-05 NOTE — Addendum Note (Signed)
Addended by: [name] on: 05/05/2019 02:40 PM   Modules accepted: Orders

## 2019-05-15 ENCOUNTER — Telehealth: Payer: Self-pay

## 2019-05-15 ENCOUNTER — Other Ambulatory Visit: Payer: Self-pay

## 2019-05-15 ENCOUNTER — Telehealth: Payer: Self-pay | Admitting: *Deleted

## 2019-05-15 NOTE — Telephone Encounter (Signed)
Copied from [place] [id number]. Topic: General - Inquiry >> May 15, 2019  9:25 AM Reyne Dumas L wrote: Reason for CRM:   Pt wants to know if he needs to fast for his appointment tomorrow and if so how long.  Pt would like a call back, if he doesn't answer states you can leave a message.

## 2019-05-15 NOTE — Telephone Encounter (Signed)
Copied from [place] [id number]. Topic: General - Other >> May 15, 2019  1:47 PM Burchel, Abbi R wrote: Reason for CRM: Pt returned call for COVID screening.  Please call pt: [phone]

## 2019-05-15 NOTE — Telephone Encounter (Signed)
Left message on machine that he does need to fast.

## 2019-05-16 ENCOUNTER — Encounter: Payer: BC Managed Care – PPO | Admitting: Family

## 2019-05-16 NOTE — Telephone Encounter (Signed)
Lvm for patient to call  Back, had appointment with [facility] today. Appt. Had to be cancelled due to provider is sick. We may be able to get him a virtual with another provider today.

## 2019-05-16 NOTE — Telephone Encounter (Signed)
Patient was scheduled for 06-18-2019

## 2019-05-29 ENCOUNTER — Other Ambulatory Visit: Payer: Self-pay | Admitting: Family

## 2019-05-29 NOTE — Telephone Encounter (Signed)
Last OV 01/07/19 Last refill 09/04/18 #30/5 Next OV 06/18/19

## 2019-06-18 ENCOUNTER — Encounter: Payer: BC Managed Care – PPO | Admitting: Family

## 2019-07-11 ENCOUNTER — Encounter: Payer: BC Managed Care – PPO | Admitting: Family

## 2019-07-24 ENCOUNTER — Other Ambulatory Visit: Payer: Self-pay | Admitting: Family

## 2019-08-01 ENCOUNTER — Other Ambulatory Visit: Payer: Self-pay

## 2019-08-01 ENCOUNTER — Encounter: Payer: Self-pay | Admitting: Family

## 2019-08-01 ENCOUNTER — Ambulatory Visit (INDEPENDENT_AMBULATORY_CARE_PROVIDER_SITE_OTHER): Payer: BC Managed Care – PPO | Admitting: Family

## 2019-08-01 VITALS — BP 156/92 | HR 71 | Temp 96.8°F | Resp 16 | Ht 70.0 in | Wt 193.0 lb

## 2019-08-01 DIAGNOSIS — F329 Major depressive disorder, single episode, unspecified: Secondary | ICD-10-CM

## 2019-08-01 DIAGNOSIS — R0789 Other chest pain: Secondary | ICD-10-CM | POA: Diagnosis not present

## 2019-08-01 DIAGNOSIS — Z0000 Encounter for general adult medical examination without abnormal findings: Secondary | ICD-10-CM

## 2019-08-01 DIAGNOSIS — Z23 Encounter for immunization: Secondary | ICD-10-CM

## 2019-08-01 DIAGNOSIS — F32A Depression, unspecified: Secondary | ICD-10-CM

## 2019-08-01 HISTORY — DX: Other chest pain: R07.89

## 2019-08-01 LAB — CBC WITH DIFFERENTIAL/PLATELET
Basophils Absolute: 0 10*3/uL (ref 0.0–0.1)
Basophils Relative: 0.3 % (ref 0.0–3.0)
Eosinophils Absolute: 0.1 10*3/uL (ref 0.0–0.7)
Eosinophils Relative: 0.8 % (ref 0.0–5.0)
HCT: 46 % (ref 39.0–52.0)
Hemoglobin: 15.6 g/dL (ref 13.0–17.0)
Lymphocytes Relative: 39.9 % (ref 12.0–46.0)
Lymphs Abs: 2.7 10*3/uL (ref 0.7–4.0)
MCHC: 34 g/dL (ref 30.0–36.0)
MCV: 90.3 fl (ref 78.0–100.0)
Monocytes Absolute: 0.5 10*3/uL (ref 0.1–1.0)
Monocytes Relative: 7.6 % (ref 3.0–12.0)
Neutro Abs: 3.5 10*3/uL (ref 1.4–7.7)
Neutrophils Relative %: 51.4 % (ref 43.0–77.0)
Platelets: 145 10*3/uL — ABNORMAL LOW (ref 150.0–400.0)
RBC: 5.09 Mil/uL (ref 4.22–5.81)
RDW: 13.8 % (ref 11.5–15.5)
WBC: 6.7 10*3/uL (ref 4.0–10.5)

## 2019-08-01 LAB — HEPATIC FUNCTION PANEL
ALT: 23 U/L (ref 0–53)
AST: 16 U/L (ref 0–37)
Albumin: 4.6 g/dL (ref 3.5–5.2)
Alkaline Phosphatase: 38 U/L — ABNORMAL LOW (ref 39–117)
Bilirubin, Direct: 0.1 mg/dL (ref 0.0–0.3)
Total Bilirubin: 0.6 mg/dL (ref 0.2–1.2)
Total Protein: 7.3 g/dL (ref 6.0–8.3)

## 2019-08-01 LAB — BASIC METABOLIC PANEL
BUN: 21 mg/dL (ref 6–23)
CO2: 29 mEq/L (ref 19–32)
Calcium: 9.6 mg/dL (ref 8.4–10.5)
Chloride: 103 mEq/L (ref 96–112)
Creatinine, Ser: 0.98 mg/dL (ref 0.40–1.50)
GFR: 78.38 mL/min (ref >60.00)
Glucose, Bld: 98 mg/dL (ref 70–99)
Potassium: 4.4 mEq/L (ref 3.5–5.1)
Sodium: 138 mEq/L (ref 135–145)

## 2019-08-01 LAB — LIPID PANEL
Cholesterol: 172 mg/dL (ref 0–200)
HDL: 41.2 mg/dL (ref >39.00)
LDL Cholesterol: 96 mg/dL (ref 0–99)
NonHDL: 130.59
Total CHOL/HDL Ratio: 4
Triglycerides: 171 mg/dL — ABNORMAL HIGH (ref 0.0–149.0)
VLDL: 34.2 mg/dL (ref 0.0–40.0)

## 2019-08-01 LAB — TSH: TSH: 2.36 u[IU]/mL (ref 0.35–4.50)

## 2019-08-01 LAB — D-DIMER, QUANTITATIVE: D-Dimer, Quant: <0.19 mcg/mL FEU (ref <0.50)

## 2019-08-01 MED ORDER — OMEPRAZOLE 20 MG PO CPDR: 20.0000 mg | DELAYED_RELEASE_CAPSULE | Freq: Every day | ORAL | 1 refills | Status: DC

## 2019-08-01 NOTE — Assessment & Plan Note (Signed)
Currently stable and managed by Dr. Toy Care (psych).

## 2019-08-01 NOTE — Assessment & Plan Note (Addendum)
EKG tracing is personally reviewed.  EKG notes NSR.  No acute changes.   Due to strong family hx of CAD- will refer to cardiology for further evaluation.  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

## 2019-08-01 NOTE — Progress Notes (Signed)
Subjective:    Patient ID: Francisco Lopez, male    DOB: 28-Mar-1961, 59 y.o.   MRN: [id number]  HPI   59 yr old male who presents today for cpx.  Patient presents today for complete physical.  Immunizations: tetanus 2013, flu shot up to date, due shingrix Diet: not eating healthy Exercise: active at work, plans to do more hiking Colonoscopy:  2013- due in 2023 Vision: last year Dental:  Up to date Wt Readings from Last 3 Encounters:  08/01/19 193 lb (87.5 kg)  03/04/18 186 lb 3.2 oz (84.5 kg)  12/20/17 182 lb (82.6 kg)   Chest discomfort- noticed about 2 months ago.  Occurs mostly at rest, not worsened by exertion.  Reports pain is sharp and only lasts a brief moment.  Denies SOB.  Denies calf/pain.  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 Dad passed of MI at 59.    Sees Dr. Nevada Crane- urology  Sees Dr. Toy Care for depression- reports that his mood has been stable.   Review of Systems  Constitutional: Negative for unexpected weight change.  HENT: Negative for rhinorrhea.   Respiratory: Negative for cough and shortness of breath.   Cardiovascular: Negative for chest pain.  Gastrointestinal: Positive for diarrhea (occasional, worse with dairy).  Genitourinary: Negative for difficulty urinating.  Musculoskeletal: Positive for arthralgias (bilateral thumb bases, sometimes shoulder pain during the rain).  Skin: Negative for rash.  Neurological: Negative for headaches.  Hematological: Negative for adenopathy.       Past Medical History:  Diagnosis Date  . Anxiety   . Basal cell carcinoma 2006  . Depression   . Diastolic dysfunction [id number]   Per 2D echo 8/16  . Frozen shoulder 2018   left  . Herpes simplex type 1 antibody positive 01/28/2015  . Herpes simplex type 2 infection 01/28/2015  . History of chicken pox   . Hyperlipidemia   . Hypertension      Social History   Socioeconomic History  . Marital status: Legally Separated    Spouse name:  Not on file  . Number of children: Not on file  . Years of education: Not on file  . Highest education level: Not on file  Occupational History  . Not on file  Tobacco Use  . Smoking status: Never Smoker  . Smokeless tobacco: Never Used  Substance and Sexual Activity  . Alcohol use: No  . Drug use: No  . Sexual activity: Yes  Other Topics Concern  . 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 Difficulty of Paying Living Expenses: Not on file  Food Insecurity:   . Worried About Charity fundraiser in the Last Year: Not on file  . Ran Out of Food in the Last Year: Not on file  Transportation Needs:   . Lack of Transportation (Medical): Not on file  . Lack of Transportation (Non-Medical): Not on file  Physical Activity:   . Days of Exercise per Week: Not on file  . Minutes of Exercise per Session: Not on file  Stress:   . Feeling of Stress : Not on file  Social Connections:   . Frequency of Communication with Friends and Family: Not on file  . Frequency of  Social Gatherings with Friends and Family: Not on file  . Attends Religious Services: Not on file  . Active Member of Clubs or Organizations: Not on file  . Attends Archivist Meetings: Not on file  . Marital Status: Not on file  Intimate Partner Violence:   . Fear of Current or Ex-Partner: Not on file  . Emotionally Abused: Not on file  . Physically Abused: Not on file  . Sexually Abused: Not on file    Past Surgical History:  Procedure Laterality Date  . KNEE SURGERY Left 2000   meninscus repair  . SHOULDER ARTHROSCOPY Left 01/2018    Family History  Problem Relation Age of Onset  . Diabetes Mother        type II  . Anxiety disorder Mother   . Hypertension Father   . Alcohol abuse Father     No Known  Allergies  Current Outpatient Medications on File Prior to Visit  Medication Sig Dispense Refill  . amlodipine-atorvastatin (CADUET) 10-20 MG tablet TAKE 1 TABLET BY MOUTH EVERY DAY 30 tablet 5  . divalproex (DEPAKOTE) 250 MG DR tablet TAKE 2 TO 3 TABLETS BY MOUTH EVERY EVENING  12  . fenofibrate (TRICOR) 145 MG tablet TAKE 1 TABLET (145 MG TOTAL) BY MOUTH DAILY. 30 tablet 5  . fluticasone (FLONASE) 50 MCG/ACT nasal spray PLACE SPRAY INTO EACH NOSTRIL TWICE A DAY 16 g 3  . meloxicam (MOBIC) 7.5 MG tablet TAKE 1 TABLET BY MOUTH ONCE DAILY AS NEEDED FOR PAIN 30 tablet 0  . Multiple Vitamins-Minerals (MENS MULTIVITAMIN PLUS PO) Take by mouth.    . QUEtiapine (SEROQUEL) 200 MG tablet TAKE 1 TO 2 TABLETS BY MOUTH EVERY EVENING AT BEDTIME  12  . tadalafil (CIALIS) 5 MG tablet Take 5 mg by mouth daily as needed.    . valACYclovir (VALTREX) 500 MG tablet TAKE 1 TABLET BY MOUTH EVERY DAY 30 tablet 5  . Krill Oil 1000 MG CAPS Take by mouth.     No current facility-administered medications on file prior to visit.    BP (!) 156/92 (BP Location: Right Arm, Patient Position: Sitting, Cuff Size: Small)   Pulse 71   Temp (!) 96.8 F (36 C) (Temporal)   Resp 16   Ht 5\' 10"  (1.778 m)   Wt 193 lb (87.5 kg)   SpO2 100%   BMI 27.69 kg/m    Objective:   Physical Exam  Physical Exam  Constitutional: He is oriented to person, place, and time. He appears well-developed and well-nourished. No distress.  HENT:  Head: Normocephalic and atraumatic.  Right Ear: Tympanic membrane and ear canal normal.  Left Ear: Tympanic membrane and ear canal normal.  Mouth/Throat: Not examined- pt wearing mask for covid precautions.  Eyes: Pupils are equal, round, and reactive to light. No scleral icterus.  Neck: Normal range of motion. No thyromegaly present.  Cardiovascular: Normal rate and regular rhythm.   No murmur heard. Pulmonary/Chest: Effort normal and breath sounds normal. No respiratory distress. He has no  wheezes. He has no rales. He exhibits no tenderness.  Abdominal: Soft. Bowel sounds are normal. He exhibits no distension and no mass. There is no tenderness. There is no rebound and no guarding.  Musculoskeletal: He exhibits no edema.  Lymphadenopathy:    He has no cervical adenopathy.  Neurological: He is alert and oriented to person, place, and time. He has normal patellar reflexes. He exhibits normal muscle tone. Coordination normal.  Skin: Skin is warm  and dry.  Psychiatric: briefly tearful upon discussion of his deceased parents. His behavior is otherwise normal. Judgment and thought content normal.           Assessment & Plan:   Preventative care- Shingrix today. PSA is being monitored by urology.   Atypical chest pain- EKG tracing is personally reviewed.  EKG notes NSR.  No acute changes.  He has a strong family hx of CAD. Will refer to cardiology for further evaluation. D dimer is obtained and is WNL.   Depression- stable, management per psychology.       Assessment & Plan:  This visit occurred during the SARS-CoV-2 public health emergency.  Safety protocols were in place, including screening questions prior to the visit, additional usage of staff PPE, and extensive cleaning of exam room while observing appropriate contact time as indicated for disinfecting solutions.

## 2019-08-01 NOTE — Assessment & Plan Note (Signed)
Discussed healthy diet and regular exercise.  Colo up to date. Shingrix #1 today. Pt counseled on importance of COVID vaccine.  Obtain routine lab work.

## 2019-08-01 NOTE — Patient Instructions (Signed)
Please complete lab work prior to leaving.   

## 2019-09-11 ENCOUNTER — Other Ambulatory Visit: Payer: Self-pay | Admitting: Family

## 2019-10-31 ENCOUNTER — Other Ambulatory Visit: Payer: Self-pay | Admitting: Family

## 2019-10-31 NOTE — Telephone Encounter (Signed)
RF request for meloxicam LOV:08/01/19 Next ov: 12/03/19 Last written:09/12/19(30,0)  Medication pending, please advise.

## 2019-12-03 ENCOUNTER — Ambulatory Visit (INDEPENDENT_AMBULATORY_CARE_PROVIDER_SITE_OTHER): Payer: BC Managed Care – PPO | Admitting: Family

## 2019-12-03 ENCOUNTER — Encounter: Payer: Self-pay | Admitting: Family

## 2019-12-03 ENCOUNTER — Other Ambulatory Visit: Payer: Self-pay

## 2019-12-03 VITALS — BP 122/80 | HR 73 | Temp 97.1°F | Resp 18 | Ht 70.0 in | Wt 193.0 lb

## 2019-12-03 DIAGNOSIS — F32A Depression, unspecified: Secondary | ICD-10-CM

## 2019-12-03 DIAGNOSIS — R0789 Other chest pain: Secondary | ICD-10-CM | POA: Diagnosis not present

## 2019-12-03 DIAGNOSIS — F329 Major depressive disorder, single episode, unspecified: Secondary | ICD-10-CM | POA: Diagnosis not present

## 2019-12-03 DIAGNOSIS — E785 Hyperlipidemia, unspecified: Secondary | ICD-10-CM

## 2019-12-03 DIAGNOSIS — I10 Essential (primary) hypertension: Secondary | ICD-10-CM | POA: Diagnosis not present

## 2019-12-03 NOTE — Progress Notes (Signed)
Subjective:    Patient ID: Francisco Lopez, male    DOB: 09-Mar-1961, 59 y.o.   MRN: [id number]  HPI  Patient is a 59 yr old male who presents today for follow up.  Atypical chest pain- no recent chest pain.   Depression- followed by Dr. Toy Care. Maintained on Depakote and Seroquel.  Elevated blood pressure reading- maintained on amlodipine-atorvastatin.   BP Readings from Last 3 Encounters:  12/03/19 122/80  08/01/19 (!) 156/92  03/04/18 136/86   Hyperlipidemia- maintained on atorvastatin/fenofibrate.  Lab Results  Component Value Date   CHOL 172 08/01/2019   HDL 41.20 08/01/2019   LDLCALC 96 08/01/2019   LDLDIRECT 97.0 12/27/2015   TRIG 171.0 (H) 08/01/2019   CHOLHDL 4 08/01/2019       Review of Systems    see HPI  Past Medical History:  Diagnosis Date   Anxiety    Basal cell carcinoma 2006   Depression    Diastolic dysfunction 9/89/2119   Per 2D echo 8/16   Frozen shoulder 2018   left   Herpes simplex type 1 antibody positive 01/28/2015   Herpes simplex type 2 infection 01/28/2015   History of chicken pox    Hyperlipidemia    Hypertension      Social History   Socioeconomic History   Marital status: Legally Separated    Spouse name: Not on file   Number of children: Not on file   Years of education: Not on file   Highest education level: Not on file  Occupational History   Not on file  Tobacco Use   Smoking status: Never Smoker   Smokeless tobacco: Never Used  Substance and Sexual Activity   Alcohol use: No   Drug use: No   Sexual activity: Yes  Other Topics Concern   Not on file  Social History Narrative   Separated   1 child (37 yr old daughter)- lives in [place] 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[employer], 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   Social Determinants of Health   Financial Resource Strain:    Difficulty of Paying Living Expenses:   Food Insecurity:    Worried About Charity fundraiser in the Last Year:    Academic librarian in the Last Year:   Transportation Needs:    Film/video editor (Medical):    Lack of Transportation (Non-Medical):   Physical Activity:    Days of Exercise per Week:    Minutes of Exercise per Session:   Stress:    Feeling of Stress :   Social Connections:    Frequency of Communication with Friends and Family:    Frequency of Social Gatherings with Friends and Family:    Attends Religious Services:    Active Member of Clubs or Organizations:    Attends Music therapist:    Marital Status:   Intimate Partner Violence:    Fear of Current or Ex-Partner:    Emotionally Abused:    Physically Abused:    Sexually Abused:     Past Surgical History:  Procedure Laterality Date   KNEE SURGERY Left 2000   meninscus repair   SHOULDER ARTHROSCOPY Left 01/2018    Family History  Problem Relation Age of Onset   Diabetes Mother        type II   Anxiety disorder Mother    Hypertension Father    Alcohol abuse Father     No Known Allergies  Current Outpatient Medications  on File Prior to Visit  Medication Sig Dispense Refill   amlodipine-atorvastatin (CADUET) 10-20 MG tablet TAKE 1 TABLET BY MOUTH EVERY DAY 30 tablet 5   divalproex (DEPAKOTE) 250 MG DR tablet TAKE 2 TO 3 TABLETS BY MOUTH EVERY EVENING  12   fenofibrate (TRICOR) 145 MG tablet TAKE 1 TABLET (145 MG TOTAL) BY MOUTH DAILY. 30 tablet 5   fluticasone (FLONASE) 50 MCG/ACT nasal spray PLACE SPRAY INTO EACH NOSTRIL TWICE A DAY 16 g 3   Krill Oil 1000 MG CAPS Take by mouth.     meloxicam (MOBIC) 7.5 MG tablet TAKE 1 TABLET BY MOUTH ONCE DAILY AS NEEDED FOR PAIN 30 tablet 0   Multiple Vitamins-Minerals (MENS MULTIVITAMIN PLUS PO) Take by mouth.     omeprazole (PRILOSEC) 20 MG capsule Take 1 capsule (20 mg total) by mouth daily. 90 capsule 1   QUEtiapine (SEROQUEL) 200 MG tablet TAKE 1 TO 2 TABLETS BY MOUTH EVERY EVENING AT BEDTIME  12   tadalafil (CIALIS) 5 MG tablet Take 5  mg by mouth daily as needed.     valACYclovir (VALTREX) 500 MG tablet TAKE 1 TABLET BY MOUTH EVERY DAY 30 tablet 5   No current facility-administered medications on file prior to visit.    BP 122/80 (BP Location: Right Arm, Patient Position: Sitting, Cuff Size: Normal)    Pulse 73    Temp (!) 97.1 F (36.2 C) (Temporal)    Resp 18    Ht 5\' 10"  (1.778 m)    Wt 193 lb (87.5 kg)    SpO2 96%    BMI 27.69 kg/m    Objective:   Physical Exam Constitutional:      General: He is not in acute distress.    Appearance: He is well-developed.  HENT:     Head: Normocephalic and atraumatic.  Cardiovascular:     Rate and Rhythm: Normal rate and regular rhythm.     Heart sounds: No murmur heard.   Pulmonary:     Effort: Pulmonary effort is normal. No respiratory distress.     Breath sounds: Normal breath sounds. No wheezing or rales.  Skin:    General: Skin is warm and dry.  Neurological:     Mental Status: He is alert and oriented to person, place, and time.  Psychiatric:        Behavior: Behavior normal.        Thought Content: Thought content normal.           Assessment & Plan:  Atypical chest pain- no recent symptoms.  Strong family hx of CAD. Referral to cardiology placed.  HTN-  bp stable/improved today- continue amlodipine.  Depression- stable. Following with psychiatry.  Hyperlipidemia-fair lipids last visit. Continue to work on diet. Continue current meds.  This visit occurred during the SARS-CoV-2 public health emergency.  Safety protocols were in place, including screening questions prior to the visit, additional usage of staff PPE, and extensive cleaning of exam room while observing appropriate contact time as indicated for disinfecting solutions.

## 2019-12-23 ENCOUNTER — Telehealth: Payer: Self-pay | Admitting: Family

## 2019-12-23 NOTE — Telephone Encounter (Signed)
I looked back at the referral and [facility] has left him 2 messaged to schedule an appointment. He can call them back at [phone].

## 2019-12-23 NOTE — Telephone Encounter (Signed)
Caller: Brailen Call back phone number: [phone]  Patient would like to know what is the purpose of the cardiology referral. Patient was under the impression you were going to order testing.  Please advise.

## 2019-12-24 NOTE — Telephone Encounter (Signed)
Patient advised of information and he will call them to set up

## 2020-01-14 ENCOUNTER — Other Ambulatory Visit: Payer: Self-pay

## 2020-01-14 ENCOUNTER — Ambulatory Visit: Payer: BC Managed Care – PPO | Admitting: Cardiology

## 2020-01-14 ENCOUNTER — Encounter: Payer: Self-pay | Admitting: Cardiology

## 2020-01-14 VITALS — BP 140/84 | HR 80 | Ht 70.0 in | Wt 196.0 lb

## 2020-01-14 DIAGNOSIS — E785 Hyperlipidemia, unspecified: Secondary | ICD-10-CM

## 2020-01-14 DIAGNOSIS — I10 Essential (primary) hypertension: Secondary | ICD-10-CM | POA: Diagnosis not present

## 2020-01-14 DIAGNOSIS — Z7189 Other specified counseling: Secondary | ICD-10-CM | POA: Diagnosis not present

## 2020-01-14 DIAGNOSIS — R079 Chest pain, unspecified: Secondary | ICD-10-CM | POA: Diagnosis not present

## 2020-01-14 MED ORDER — ASPIRIN EC 81 MG PO TBEC: 81.0000 mg | DELAYED_RELEASE_TABLET | Freq: Every day | ORAL | 3 refills | Status: DC

## 2020-01-14 NOTE — Progress Notes (Signed)
Cardiology Office Note:    Date:  01/14/2020   ID:  Francisco Lopez, DOB 03/25/1961, MRN [id number]  PCP:  [name], NP  Cardiologist:  Shirlee More, MD   Referring MD: [name], NP  ASSESSMENT:    1. Cardiac risk counseling   2. Chest pain of uncertain etiology   3. Essential hypertension   4. Hyperlipidemia, unspecified hyperlipidemia type    PLAN:    In order of problems listed above:  1. His cardiovascular risk profile is intermediate however incorporating his family history raises concern.  We reviewed options for further evaluation I think a coronary calcium score be helpful it of his high risk he would need ischemia evaluation.  Otherwise continue his antihypertensives statin resume aspirin 81 mg daily. 2. Not recent not anginal in nature unless he has a high calcium score I would do an ischemia evaluation 3. Continue current treatment I asked him to be diligent and check home blood pressure which she has not done recently 4. Continue statin  Next appointment I plan to see back in the office as needed however if his calcium score is high contact him to undergo cardiac CTA   Medication Adjustments/Labs and Tests Ordered: Current medicines are reviewed at length with the patient today.  Concerns regarding medicines are outlined above.  Orders Placed This Encounter  Procedures   CT CARDIAC SCORING   EKG 12-Lead   Meds ordered this encounter  Medications   aspirin EC 81 MG tablet    Sig: Take 1 tablet (81 mg total) by mouth daily. Swallow whole.    Dispense:  90 tablet    Refill:  3     Chief Complaint  Patient presents with   Chest Pain    History of Present Illness:    Francisco Lopez is a 59 y.o. male who is being seen today for the evaluation of hypertension hyperlipidemia and family history of CAD at the request of [name], NP. Chart review shows he had an echocardiogram done August 2016 showing mild left ventricular  hypertrophy grade 1 diastolic dysfunction EF 60 to 65%.  He had mild aortic valve sclerosis and mitral annular calcification.  He has had nonexertional chest discomfort not in the last 6 months to a year it is never been frequent and will be a brief fullness in his chest and palpitation.  He is concerned because his father died of 77 with known CAD sudden death in her brother died about the same age outside of the hospital.  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  He has no exertional chest pain shortness of breath claudication or syncope.  Home blood pressures generally less than 140/90.  Repeat by me in the office 152/80.  He has been on a statin now for 5 years Past Medical History:  Diagnosis Date   Anxiety    Basal cell carcinoma 2006   Depression    Diastolic dysfunction 1/88/4166   Per 2D echo 8/16   Frozen shoulder 2018   left   Herpes simplex type 1 antibody positive 01/28/2015   Herpes simplex type 2 infection 01/28/2015   History of chicken pox    Hyperlipidemia    Hypertension     Past Surgical History:  Procedure Laterality Date   KNEE SURGERY Left 2000   meninscus repair   SHOULDER ARTHROSCOPY Left 01/2018    Current Medications: Current Meds  Medication Sig   amlodipine-atorvastatin (  CADUET) 10-20 MG tablet TAKE 1 TABLET BY MOUTH EVERY DAY   divalproex (DEPAKOTE) 250 MG DR tablet TAKE 2 TO 3 TABLETS BY MOUTH EVERY EVENING   fenofibrate (TRICOR) 145 MG tablet TAKE 1 TABLET (145 MG TOTAL) BY MOUTH DAILY.   meloxicam (MOBIC) 7.5 MG tablet TAKE 1 TABLET BY MOUTH ONCE DAILY AS NEEDED FOR PAIN   Multiple Vitamins-Minerals (MENS MULTIVITAMIN PLUS PO) Take by mouth.   omeprazole (PRILOSEC) 20 MG capsule Take 1 capsule (20 mg total) by mouth daily.   QUEtiapine (SEROQUEL) 50 MG tablet Take 100-150 mg by mouth at bedtime.   tadalafil (CIALIS) 5 MG tablet Take 5 mg by mouth daily as  needed.   valACYclovir (VALTREX) 500 MG tablet TAKE 1 TABLET BY MOUTH EVERY DAY   [DISCONTINUED] QUEtiapine (SEROQUEL) 200 MG tablet TAKE 1 TO 2 TABLETS BY MOUTH EVERY EVENING AT BEDTIME     Allergies:   Patient has no known allergies.   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[employer], 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   Social Determinants of Health   Financial Resource Strain:    Difficulty of Paying Living Expenses:   Food Insecurity:    Worried About Charity fundraiser in the Last Year:    Arboriculturist in the Last Year:   Transportation Needs:    Film/video editor (Medical):    Lack of Transportation (Non-Medical):   Physical Activity:    Days of Exercise per Week:    Minutes of Exercise per Session:   Stress:    Feeling of Stress :   Social Connections:    Frequency of Communication with Friends and Family:    Frequency of Social Gatherings with Friends and Family:    Attends Religious Services:    Active Member of Clubs or Organizations:    Attends Archivist Meetings:    Marital Status:      Family History: The patient's family history includes Alcohol abuse in his father; Anxiety disorder in his mother; Diabetes in his mother; Hypertension in his father.  ROS:   Review of Systems  Constitutional: Negative.  HENT: Negative.   Eyes: Negative.   Cardiovascular: Positive for chest pain and palpitations.    Respiratory: Negative.   Endocrine: Negative.   Hematologic/Lymphatic: Negative.   Skin: Negative.   Musculoskeletal: Negative.   Gastrointestinal: Negative.   Genitourinary: Negative.   Neurological: Negative.   Psychiatric/Behavioral: Negative.   Allergic/Immunologic: Negative.    Please see the history of present illness.     All other systems reviewed and are negative.  EKGs/Labs/Other Studies Reviewed:    The following studies were reviewed today:   EKG:  EKG is  ordered today.  The ekg ordered today is personally reviewed and demonstrates sinus rhythm normal EKG 07/22/2019 showed sinus rhythm  was normal Recent Labs: 08/01/2019: ALT 23; BUN 21; Creatinine, Ser 0.98; Hemoglobin 15.6; Platelets 145.0; Potassium 4.4; Sodium 138; TSH 2.36  Recent Lipid Panel    Component Value Date/Time   CHOL 172 08/01/2019 1353   TRIG 171.0 (H) 08/01/2019 1353   HDL 41.20 08/01/2019 1353   CHOLHDL 4 08/01/2019 1353   VLDL 34.2 08/01/2019 1353   LDLCALC 96 08/01/2019 1353   LDLDIRECT 97.0 12/27/2015 1651    Physical Exam:    VS:  BP 140/84 (BP Location: Left Arm, Patient Position: Sitting, Cuff Size: Normal)    Pulse 80    Ht 5\' 10"  (1.778 m)    Wt 196 lb (88.9 kg)    SpO2 96%    BMI 28.12 kg/m     Wt Readings from Last 3 Encounters:  01/14/20 196 lb (88.9 kg)  12/03/19 193 lb (87.5 kg)  08/01/19 193 lb (87.5 kg)  Repeat blood pressure 152/80 right upper extremity  GEN:  Well nourished, well developed in no acute distress he has no xanthoma or xanthelasma HEENT: Normal NECK: No JVD; No carotid bruits LYMPHATICS: No lymphadenopathy CARDIAC: RRR, no murmurs, rubs, gallops RESPIRATORY:  Clear to auscultation without rales, wheezing or rhonchi  ABDOMEN: Soft, non-tender, non-distended MUSCULOSKELETAL:  No edema; No deformity  SKIN: Warm and dry NEUROLOGIC:  Alert and oriented x 3 PSYCHIATRIC:  Normal affect     Signed, Shirlee More, MD  01/14/2020 10:14 AM    [place]

## 2020-01-14 NOTE — Patient Instructions (Signed)
Medication Instructions:  Your physician has recommended you make the following change in your medication:  START: Aspirin 81 mg take one tablet by mouth daily.  *If you need a refill on your cardiac medications before your next appointment, please call your pharmacy*   Lab Work: None If you have labs (blood work) drawn today and your tests are completely normal, you will receive your results only by: Marland Kitchen MyChart Message (if you have MyChart) OR . A paper copy in the mail If you have any lab test that is abnormal or we need to change your treatment, we will call you to review the results.   Testing/Procedures: We have put in the order for you to have the CT cardiac scoring completed. They will call you to schedule this appointment.    Follow-Up: At [facility], you and your health needs are our priority.  As part of our continuing mission to provide you with exceptional heart care, we have created designated Provider Care Teams.  These Care Teams include your primary Cardiologist (physician) and Advanced Practice Providers (APPs -  Physician Assistants and Nurse Practitioners) who all work together to provide you with the care you need, when you need it.  We recommend signing up for the patient portal called "MyChart".  Sign up information is provided on this After Visit Summary.  MyChart is used to connect with patients for Virtual Visits (Telemedicine).  Patients are able to view lab/test results, encounter notes, upcoming appointments, etc.  Non-urgent messages can be sent to your provider as well.   To learn more about what you can do with MyChart, go to [email or web address].    Your next appointment:   As needed  The format for your next appointment:   In Person  Provider:   Shirlee More, MD   Other Instructions

## 2020-01-19 ENCOUNTER — Ambulatory Visit (INDEPENDENT_AMBULATORY_CARE_PROVIDER_SITE_OTHER)
Admission: RE | Admit: 2020-01-19 | Discharge: 2020-01-19 | Disposition: A | Discharge status: Home / Self Care | Payer: Self-pay | Source: Ambulatory Visit | Attending: Cardiology | Admitting: Cardiology

## 2020-01-19 ENCOUNTER — Other Ambulatory Visit: Payer: Self-pay

## 2020-01-19 DIAGNOSIS — R079 Chest pain, unspecified: Secondary | ICD-10-CM

## 2020-01-19 DIAGNOSIS — R911 Solitary pulmonary nodule: Secondary | ICD-10-CM | POA: Diagnosis not present

## 2020-01-19 DIAGNOSIS — I700 Atherosclerosis of aorta: Secondary | ICD-10-CM | POA: Diagnosis not present

## 2020-01-19 DIAGNOSIS — I10 Essential (primary) hypertension: Secondary | ICD-10-CM

## 2020-01-19 DIAGNOSIS — Z7189 Other specified counseling: Secondary | ICD-10-CM

## 2020-01-19 DIAGNOSIS — E785 Hyperlipidemia, unspecified: Secondary | ICD-10-CM

## 2020-01-19 DIAGNOSIS — R918 Other nonspecific abnormal finding of lung field: Secondary | ICD-10-CM | POA: Diagnosis not present

## 2020-01-20 ENCOUNTER — Telehealth: Payer: Self-pay

## 2020-01-20 NOTE — Telephone Encounter (Signed)
-----   Message from Berniece Salines, DO sent at 01/20/2020  8:30 AM EDT ----- Your calcium score is 391.  You are currently on aspirin and Lipitor 20 mg daily.  Please make a follow-up appointment as we may need to schedule further testing for your coronary arteries. There is an accidental finding of 2 mm left lower lobe nodule in your lung.  Or forward this CT scan to your primary care doctor as you may need a repeat study in a year for your lungs.

## 2020-01-20 NOTE — Telephone Encounter (Signed)
Left message on patients voicemail to please return our call.   

## 2020-01-20 NOTE — Telephone Encounter (Signed)
Spoke with patient regarding results and recommendation.  Patient verbalizes understanding and is agreeable to plan of care. Advised patient to call back with any issues or concerns.  

## 2020-02-02 ENCOUNTER — Ambulatory Visit: Payer: BC Managed Care – PPO | Admitting: Cardiology

## 2020-02-06 ENCOUNTER — Other Ambulatory Visit: Payer: Self-pay

## 2020-02-06 ENCOUNTER — Ambulatory Visit: Payer: BC Managed Care – PPO | Admitting: Cardiology

## 2020-02-06 ENCOUNTER — Encounter: Payer: Self-pay | Admitting: Cardiology

## 2020-02-06 VITALS — BP 166/88 | HR 76 | Ht 70.0 in | Wt 191.1 lb

## 2020-02-06 DIAGNOSIS — I10 Essential (primary) hypertension: Secondary | ICD-10-CM

## 2020-02-06 DIAGNOSIS — E782 Mixed hyperlipidemia: Secondary | ICD-10-CM

## 2020-02-06 DIAGNOSIS — Z8669 Personal history of other diseases of the nervous system and sense organs: Secondary | ICD-10-CM | POA: Diagnosis not present

## 2020-02-06 DIAGNOSIS — R931 Abnormal findings on diagnostic imaging of heart and coronary circulation: Secondary | ICD-10-CM

## 2020-02-06 HISTORY — DX: Abnormal findings on diagnostic imaging of heart and coronary circulation: R93.1

## 2020-02-06 MED ORDER — FISH OIL 1000 MG PO CAPS: 2.0000 | ORAL_CAPSULE | Freq: Two times a day (BID) | ORAL | 3 refills | Status: AC

## 2020-02-06 NOTE — Patient Instructions (Signed)
Medication Instructions:  Your physician has recommended you make the following change in your medication:   Take 2 gms fish oil twice daily.  *If you need a refill on your cardiac medications before your next appointment, please call your pharmacy*   Lab Work: Your physician recommends that you return for lab work in: next few days. You need to have labs done when you are fasting.  You can come Monday through Friday 8:30 am to 12:00 pm and 1:15 to 4:30. You do not need to make an appointment as the order has already been placed. The labs you are going to have done are BMET,TSH, LFT and Lipids.   If you have labs (blood work) drawn today and your tests are completely normal, you will receive your results only by: Marland Kitchen MyChart Message (if you have MyChart) OR . A paper copy in the mail If you have any lab test that is abnormal or we need to change your treatment, we will call you to review the results.   Testing/Procedures: None ordered   Follow-Up: At [facility], you and your health needs are our priority.  As part of our continuing mission to provide you with exceptional heart care, we have created designated Provider Care Teams.  These Care Teams include your primary Cardiologist (physician) and Advanced Practice Providers (APPs -  Physician Assistants and Nurse Practitioners) who all work together to provide you with the care you need, when you need it.  We recommend signing up for the patient portal called "MyChart".  Sign up information is provided on this After Visit Summary.  MyChart is used to connect with patients for Virtual Visits (Telemedicine).  Patients are able to view lab/test results, encounter notes, upcoming appointments, etc.  Non-urgent messages can be sent to your provider as well.   To learn more about what you can do with MyChart, go to [email or web address].    Your next appointment:   6 month(s)  The format for your next appointment:   In  Person  Provider:   Jyl Heinz, MD   Other Instructions NA

## 2020-02-06 NOTE — Progress Notes (Signed)
Cardiology Office Note:    Date:  02/06/2020   ID:  Francisco Lopez, DOB 04/06/1961, MRN [id number]  PCP:  [name], NP  Cardiologist:  Jenean Lindau, MD   Referring MD: [name], NP    ASSESSMENT:    1. Essential hypertension   2. Mixed hyperlipidemia   3. History of obstructive sleep apnea   4. Elevated coronary artery calcium score    PLAN:    In order of problems listed above:  1. Elevated calcium score: Secondary prevention stressed with the patient.  Importance of compliance with diet medication stressed any vocalized understanding.  I went over with him with his history extensively and he does not have any symptoms of chest pain.  He is an active gentleman.  Therefore medical management was recommended. 2. Essential hypertension: Blood pressure stable and diet was emphasized.  I told the patient to exercise at least half an hour a day 5 days a week and he promises to do so. 3. Mixed dyslipidemia: Diet was reviewed lipids were reviewed and he will be back in the next few days for lipid check.  For his elevated triglycerides I told him we will add 2 g twice daily of fish oil and he promises to do so. 4. Patient will be seen in follow-up appointment in 2 months or earlier if the patient has any concerns    Medication Adjustments/Labs and Tests Ordered: Current medicines are reviewed at length with the patient today.  Concerns regarding medicines are outlined above.  No orders of the defined types were placed in this encounter.  No orders of the defined types were placed in this encounter.    No chief complaint on file.    History of Present Illness:    Francisco Lopez is a 59 y.o. male.  Patient has past medical history of essential hypertension and dyslipidemia.  He was seen today for elevated calcium score.  He denies any chest pain orthopnea or PND.  He is an active gentleman.  At the time of my evaluation, the patient is alert awake oriented and in  no distress.  Past Medical History:  Diagnosis Date  . Anxiety   . Basal cell carcinoma 2006  . Depression   . Diastolic dysfunction 6/57/8469   Per 2D echo 8/16  . Frozen shoulder 2018   left  . Herpes simplex type 1 antibody positive 01/28/2015  . Herpes simplex type 2 infection 01/28/2015  . History of chicken pox   . Hyperlipidemia   . Hypertension     Past Surgical History:  Procedure Laterality Date  . KNEE SURGERY Left 2000   meninscus repair  . SHOULDER ARTHROSCOPY Left 01/2018    Current Medications: Current Meds  Medication Sig  . amlodipine-atorvastatin (CADUET) 10-20 MG tablet TAKE 1 TABLET BY MOUTH EVERY DAY  . aspirin EC 81 MG tablet Take 1 tablet (81 mg total) by mouth daily. Swallow whole.  . divalproex (DEPAKOTE) 250 MG DR tablet TAKE 2 TO 3 TABLETS BY MOUTH EVERY EVENING  . fenofibrate (TRICOR) 145 MG tablet TAKE 1 TABLET (145 MG TOTAL) BY MOUTH DAILY.  . meloxicam (MOBIC) 7.5 MG tablet TAKE 1 TABLET BY MOUTH ONCE DAILY AS NEEDED FOR PAIN  . Multiple Vitamins-Minerals (MENS MULTIVITAMIN PLUS PO) Take by mouth.  Marland Kitchen omeprazole (PRILOSEC) 20 MG capsule Take 1 capsule (20 mg total) by mouth daily.  . QUEtiapine (SEROQUEL) 50 MG tablet Take 100-150 mg by mouth at bedtime.  . tadalafil (CIALIS) 5  MG tablet Take 5 mg by mouth daily as needed.  . valACYclovir (VALTREX) 500 MG tablet TAKE 1 TABLET BY MOUTH EVERY DAY     Allergies:   Patient has no known allergies.   Social History   Socioeconomic History  . Marital status: Legally Separated    Spouse name: Not on file  . Number of children: Not on file  . Years of education: Not on file  . Highest education level: Not on file  Occupational History  . Not on file  Tobacco Use  . Smoking status: Never Smoker  . Smokeless tobacco: Never Used  Substance and Sexual Activity  . Alcohol use: No  . Drug use: No  . Sexual activity: Yes  Other Topics Concern  . 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 Difficulty of Paying Living Expenses: Not on file  Food Insecurity:   . Worried About Charity fundraiser in the Last Year: Not on file  . Ran Out of Food in the Last Year: Not on file  Transportation Needs:   . Lack of Transportation (Medical): Not on file  . Lack of Transportation (Non-Medical): Not on file  Physical Activity:   . Days of Exercise per Week: Not on file  . Minutes of Exercise per Session: Not on file  Stress:   . Feeling of Stress : Not on file  Social Connections:   . Frequency of Communication with Friends and Family: Not on file  . Frequency of Social Gatherings with Friends and Family: Not on file  . Attends Religious Services: Not on file  . Active Member of Clubs or Organizations: Not on file  . Attends Archivist Meetings: Not on file  . Marital Status: Not on file     Family History: The patient's family history includes Alcohol abuse in his father; Anxiety disorder in his mother; Diabetes in his mother; Hypertension in his father.  ROS:   Please see the history of present illness.    All other systems reviewed and are negative.  EKGs/Labs/Other Studies Reviewed:    The following studies were reviewed today: 01/20/2020 06:51  CLINICAL DATA:  Risk stratification 59 year old male with family history of CAD  EXAM: Coronary Calcium Score  TECHNIQUE: The patient was scanned on a [employer] scanner. Axial non-contrast 3 mm slices were carried out through the heart. The data set was analyzed on a dedicated work station and scored using the [place].  FINDINGS: Non-cardiac: See separate report from [facility].  Aorta: Normal size.  Descending atherosclerosis.  Pericardium: Normal  Coronary arteries: Normal origins.  LAD,  CIRC and RCA calcifications  IMPRESSION: 1. Coronary calcium score of 391 this was 41 percentile for age and sex matched control.  2.  Descending aortic atherosclerosis.  Candee Furbish, MD [facility]   Electronically Signed   By: Candee Furbish MD   On: 01/20/2020 06:51   Recent Labs: 08/01/2019: ALT 23; BUN 21; Creatinine, Ser 0.98; Hemoglobin 15.6; Platelets 145.0; Potassium 4.4; Sodium 138; TSH 2.36  Recent Lipid Panel    Component Value Date/Time   CHOL 172 08/01/2019 1353   TRIG 171.0 (H) 08/01/2019 1353   HDL 41.20 08/01/2019 1353   CHOLHDL 4 08/01/2019 1353  VLDL 34.2 08/01/2019 1353   LDLCALC 96 08/01/2019 1353   LDLDIRECT 97.0 12/27/2015 1651    Physical Exam:    VS:  BP (!) 166/88   Pulse 76   Ht 5\' 10"  (1.778 m)   Wt 191 lb 1.3 oz (86.7 kg)   SpO2 97%   BMI 27.42 kg/m     Wt Readings from Last 3 Encounters:  02/06/20 191 lb 1.3 oz (86.7 kg)  01/14/20 196 lb (88.9 kg)  12/03/19 193 lb (87.5 kg)     GEN: Patient is in no acute distress HEENT: Normal NECK: No JVD; No carotid bruits LYMPHATICS: No lymphadenopathy CARDIAC: Hear sounds regular, 2/6 systolic murmur at the apex. RESPIRATORY:  Clear to auscultation without rales, wheezing or rhonchi  ABDOMEN: Soft, non-tender, non-distended MUSCULOSKELETAL:  No edema; No deformity  SKIN: Warm and dry NEUROLOGIC:  Alert and oriented x 3 PSYCHIATRIC:  Normal affect   Signed, Jenean Lindau, MD  02/06/2020 4:21 PM    [facility]

## 2020-02-06 NOTE — Addendum Note (Signed)
Addended by: Truddie Hidden on: 02/06/2020 04:29 PM   Modules accepted: Orders

## 2020-02-17 ENCOUNTER — Telehealth: Payer: Self-pay | Admitting: Family

## 2020-02-17 DIAGNOSIS — R931 Abnormal findings on diagnostic imaging of heart and coronary circulation: Secondary | ICD-10-CM | POA: Diagnosis not present

## 2020-02-17 DIAGNOSIS — E782 Mixed hyperlipidemia: Secondary | ICD-10-CM | POA: Diagnosis not present

## 2020-02-17 DIAGNOSIS — I10 Essential (primary) hypertension: Secondary | ICD-10-CM | POA: Diagnosis not present

## 2020-02-17 NOTE — Telephone Encounter (Signed)
Medication: QUEtiapine (SEROQUEL) 50 MG tablet [[id number]]       Has the patient contacted their pharmacy?  (If no, request that the patient contact the pharmacy for the refill.) (If yes, when and what did the pharmacy advise?)     Preferred Pharmacy (with phone number or street name):  [id number] [place], [place] - [address] Phone:  [phone]  Fax:  [phone]          Agent: Please be advised that RX refills may take up to 3 business days. We ask that you follow-up with your pharmacy.

## 2020-02-18 LAB — HEPATIC FUNCTION PANEL
ALT: 24 IU/L (ref 0–44)
AST: 17 IU/L (ref 0–40)
Albumin: 4.6 g/dL (ref 3.8–4.9)
Alkaline Phosphatase: 47 IU/L — ABNORMAL LOW (ref 48–121)
Bilirubin Total: 0.4 mg/dL (ref 0.0–1.2)
Bilirubin, Direct: 0.11 mg/dL (ref 0.00–0.40)
Total Protein: 6.7 g/dL (ref 6.0–8.5)

## 2020-02-18 LAB — BASIC METABOLIC PANEL
BUN/Creatinine Ratio: 19 (ref 9–20)
BUN: 18 mg/dL (ref 6–24)
CO2: 19 mmol/L — ABNORMAL LOW (ref 20–29)
Calcium: 9.7 mg/dL (ref 8.7–10.2)
Chloride: 107 mmol/L — ABNORMAL HIGH (ref 96–106)
Creatinine, Ser: 0.97 mg/dL (ref 0.76–1.27)
GFR calc Af Amer: 98 mL/min/{1.73_m2} (ref >59)
GFR calc non Af Amer: 85 mL/min/{1.73_m2} (ref >59)
Glucose: 97 mg/dL (ref 65–99)
Potassium: 4.2 mmol/L (ref 3.5–5.2)
Sodium: 143 mmol/L (ref 134–144)

## 2020-02-18 LAB — LIPID PANEL
Chol/HDL Ratio: 4.5 ratio (ref 0.0–5.0)
Cholesterol, Total: 161 mg/dL (ref 100–199)
HDL: 36 mg/dL — ABNORMAL LOW (ref >39)
LDL Chol Calc (NIH): 98 mg/dL (ref 0–99)
Triglycerides: 155 mg/dL — ABNORMAL HIGH (ref 0–149)
VLDL Cholesterol Cal: 27 mg/dL (ref 5–40)

## 2020-02-18 LAB — TSH: TSH: 1.41 u[IU]/mL (ref 0.450–4.500)

## 2020-02-18 NOTE — Telephone Encounter (Signed)
Patient does not get this medication from [place]

## 2020-02-23 MED ORDER — AMLODIPINE BESYLATE 10 MG PO TABS
10.0000 mg | ORAL_TABLET | Freq: Every day | ORAL | 3 refills | Status: DC
Start: 2020-02-23 — End: 2021-01-25

## 2020-02-23 MED ORDER — ATORVASTATIN CALCIUM 40 MG PO TABS: 40.0000 mg | ORAL_TABLET | Freq: Every day | ORAL | 3 refills | Status: DC

## 2020-02-23 NOTE — Addendum Note (Signed)
Addended by: Truddie Hidden on: 02/23/2020 05:10 PM   Modules accepted: Orders

## 2020-03-17 ENCOUNTER — Ambulatory Visit: Payer: BC Managed Care – PPO | Admitting: Medical

## 2020-03-17 ENCOUNTER — Encounter: Payer: Self-pay | Admitting: Medical

## 2020-03-17 ENCOUNTER — Other Ambulatory Visit: Payer: Self-pay

## 2020-03-17 VITALS — BP 150/78 | HR 69 | Resp 18 | Ht 70.0 in | Wt 190.2 lb

## 2020-03-17 DIAGNOSIS — M549 Dorsalgia, unspecified: Secondary | ICD-10-CM | POA: Diagnosis not present

## 2020-03-17 DIAGNOSIS — N50811 Right testicular pain: Secondary | ICD-10-CM

## 2020-03-17 DIAGNOSIS — I10 Essential (primary) hypertension: Secondary | ICD-10-CM | POA: Diagnosis not present

## 2020-03-17 DIAGNOSIS — F3174 Bipolar disorder, in full remission, most recent episode manic: Secondary | ICD-10-CM | POA: Diagnosis not present

## 2020-03-17 DIAGNOSIS — R1031 Right lower quadrant pain: Secondary | ICD-10-CM

## 2020-03-17 DIAGNOSIS — F3131 Bipolar disorder, current episode depressed, mild: Secondary | ICD-10-CM | POA: Diagnosis not present

## 2020-03-17 LAB — POC URINALSYSI DIPSTICK (AUTOMATED)
Bilirubin, UA: NEGATIVE
Blood, UA: NEGATIVE
Glucose, UA: NEGATIVE
Ketones, UA: NEGATIVE
Leukocytes, UA: NEGATIVE
Nitrite, UA: NEGATIVE
Protein, UA: NEGATIVE
Spec Grav, UA: 1.015 (ref 1.010–1.025)
Urobilinogen, UA: 0.2 E.U./dL
pH, UA: 6.5 (ref 5.0–8.0)

## 2020-03-17 MED ORDER — CIPROFLOXACIN HCL 500 MG PO TABS: 500.0000 mg | ORAL_TABLET | Freq: Two times a day (BID) | ORAL | 0 refills | Status: DC

## 2020-03-17 NOTE — Patient Instructions (Signed)
For recent right testicle pain for 2 weeks, I placed order for ultrasound of scrotum/Doppler.  You were scheduled to get that done at 3 PM tomorrow afternoon.  I will go ahead and prescribe Cipro antibiotic and take Tylenol for pain.  Presently pending results of ultrasound treating for epididymitis.  If any severe signs/symptoms change prior to testing/ultrasound tomorrow then be seen in the emergency department.  Your blood pressure is elevated today.  But on recheck it was about 20 points less than first reading.  Still a bit high but you had 4 cups of coffee.  Recommend checking blood pressure at home later this afternoon and tomorrow morning.  Please send me a MyChart message on those readings.  If still elevated would consider adding BP medication.  Avoid NSAIDs presently due to high BP.  Follow-up date to be determined after review of ultrasound.  Depending on how you do clinically and depending on ultrasound findings might need to send you to urologist.

## 2020-03-17 NOTE — Progress Notes (Signed)
Subjective:    Patient ID: Francisco Lopez, male    DOB: [birth year], 59 y.o.   MRN: [id number]  HPI  Pt has some rt side groin region intermittent pain. Pt states dull sensation that feel like originated in rt testicle and radiating upwards to rt groin area. Pt states pain has been off and on for about a month.  Pt drives tanker. Sometimes notes leaning to left reliefs mild discomfort. Sometimes leaning to rt seems to increase pain.  Pt has some htn. Pt on amlodipine. Pt has bp cuff at home. He had dot exam 2 months and his bp was controlled on that date.      Review of Systems  Constitutional: Negative for chills, fatigue and fever.  Respiratory: Negative for cough, chest tightness, shortness of breath and wheezing.   Cardiovascular: Negative for chest pain and palpitations.  Gastrointestinal: Negative for abdominal pain.  Genitourinary: Negative for decreased urine volume, discharge, dysuria, frequency, penile swelling and testicular pain.       Rt testilce pain. Groin pain.  Musculoskeletal: Negative for back pain.  Skin: Negative for rash.  Neurological: Negative for syncope, speech difficulty, weakness and headaches.  Hematological: Negative for adenopathy. Does not bruise/bleed easily.  Psychiatric/Behavioral: Negative for behavioral problems and confusion.     Past Medical History:  Diagnosis Date  . Anxiety   . Basal cell carcinoma 2006  . Depression   . Diastolic dysfunction 01/19/1750   Per 2D echo 8/16  . Frozen shoulder 2018   left  . Herpes simplex type 1 antibody positive 01/28/2015  . Herpes simplex type 2 infection 01/28/2015  . History of chicken pox   . Hyperlipidemia   . Hypertension      Social History   Socioeconomic History  . Marital status: Legally Separated    Spouse name: Not on file  . Number of children: Not on file  . Years of education: Not on file  . Highest education level: Not on file  Occupational History  . Not on file  Tobacco Use    . Smoking status: Never Smoker  . Smokeless tobacco: Never Used  Substance and Sexual Activity  . Alcohol use: No  . Drug use: No  . Sexual activity: Yes  Other Topics Concern  . 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 Difficulty of Paying Living Expenses: Not on file  Food Insecurity:   . Worried About Charity fundraiser in the Last Year: Not on file  . Ran Out of Food in the Last Year: Not on file  Transportation Needs:   . Lack of Transportation (Medical): Not on file  . Lack of Transportation (Non-Medical): Not on file  Physical Activity:   . Days of Exercise per Week: Not on file  . Minutes of Exercise per Session: Not on file  Stress:   . Feeling of Stress : Not on file  Social Connections:   . Frequency of Communication with Friends and Family: Not on file  . Frequency of Social Gatherings with Friends and Family: Not on file  . Attends Religious Services: Not on file  . Active Member of Clubs or Organizations: Not on file  . Attends Archivist Meetings: Not on file  . Marital  Status: Not on file  Intimate Partner Violence:   . Fear of Current or Ex-Partner: Not on file  . Emotionally Abused: Not on file  . Physically Abused: Not on file  . Sexually Abused: Not on file    Past Surgical History:  Procedure Laterality Date  . KNEE SURGERY Left 2000   meninscus repair  . SHOULDER ARTHROSCOPY Left 01/2018    Family History  Problem Relation Age of Onset  . Diabetes Mother        type II  . Anxiety disorder Mother   . Hypertension Father   . Alcohol abuse Father     No Known Allergies  Current Outpatient Medications on File Prior to Visit  Medication Sig Dispense Refill  . amLODipine (NORVASC) 10 MG tablet Take 1 tablet (10 mg total) by mouth daily. 180  tablet 3  . aspirin EC 81 MG tablet Take 1 tablet (81 mg total) by mouth daily. Swallow whole. 90 tablet 3  . atorvastatin (LIPITOR) 40 MG tablet Take 1 tablet (40 mg total) by mouth daily. 90 tablet 3  . divalproex (DEPAKOTE) 250 MG DR tablet TAKE 2 TO 3 TABLETS BY MOUTH EVERY EVENING  12  . fenofibrate (TRICOR) 145 MG tablet TAKE 1 TABLET (145 MG TOTAL) BY MOUTH DAILY. 30 tablet 5  . meloxicam (MOBIC) 7.5 MG tablet TAKE 1 TABLET BY MOUTH ONCE DAILY AS NEEDED FOR PAIN 30 tablet 0  . Multiple Vitamins-Minerals (MENS MULTIVITAMIN PLUS PO) Take by mouth.    . Omega-3 Fatty Acids (FISH OIL) 1000 MG CAPS Take 2 capsules (2,000 mg total) by mouth 2 (two) times daily. 180 capsule 3  . omeprazole (PRILOSEC) 20 MG capsule Take 1 capsule (20 mg total) by mouth daily. 90 capsule 1  . QUEtiapine (SEROQUEL) 50 MG tablet Take 100-150 mg by mouth at bedtime.    . tadalafil (CIALIS) 5 MG tablet Take 5 mg by mouth daily as needed.    . valACYclovir (VALTREX) 500 MG tablet TAKE 1 TABLET BY MOUTH EVERY DAY 30 tablet 5   No current facility-administered medications on file prior to visit.    BP (!) 170/93   Pulse 69   Resp 18   Ht 5\' 10"  (1.778 m)   Wt 190 lb 3.2 oz (86.3 kg)   SpO2 95%   BMI 27.29 kg/m       Objective:   Physical Exam  General Mental Status- Alert. General Appearance- Not in acute distress.   Skin General: Color- Normal Color. Moisture- Normal Moisture.  Neck Carotid Arteries- Normal color. Moisture- Normal Moisture. No carotid bruits. No JVD.  Chest and Lung Exam Auscultation: Breath Sounds:-Normal.  Cardiovascular Auscultation:Rythm- Regular. Murmurs & Other Heart Sounds:Auscultation of the heart reveals- No Murmurs.  Abdomen Inspection:-Inspeection Normal. Palpation/Percussion:Note:No mass. Palpation and Percussion of the abdomen reveal- Non Tender, Non Distended + BS, no rebound or guarding.   Neurologic Cranial Nerve exam:- CN III-XII intact(No nystagmus),  symmetric smile. Strength:- 5/5 equal and symmetric strength both upper and lower extremities.  Genital exam- no hernia on exam of inguinal canals, testicles symmetric with no masses. Rt testicle faint tender in area of epididymus.    Assessment & Plan:  For recent right testicle pain for 2 weeks, I placed order for ultrasound of scrotum/Doppler.  You were scheduled to get that done at 3 PM tomorrow afternoon.  I will go ahead and prescribe Cipro antibiotic and take Tylenol for pain.  Presently pending results of ultrasound treating for epididymitis.  If any severe signs/symptoms change prior to testing/ultrasound tomorrow then be seen in the emergency department.  Your blood pressure is elevated today.  But on recheck it was about 20 points less than first reading.  Still a bit high but you had 4 cups of coffee.  Recommend checking blood pressure at home later this afternoon and tomorrow morning.  Please send me a MyChart message on those readings.  If still elevated would consider adding BP medication.  Avoid NSAIDs presently due to high BP.  Follow-up date to be determined after review of ultrasound.  Depending on how you do clinically and depending on ultrasound findings might need to send you to urologist.

## 2020-03-18 ENCOUNTER — Ambulatory Visit (HOSPITAL_BASED_OUTPATIENT_CLINIC_OR_DEPARTMENT_OTHER)
Admission: RE | Admit: 2020-03-18 | Discharge: 2020-03-18 | Disposition: A | Discharge status: Home / Self Care | Payer: BC Managed Care – PPO | Source: Ambulatory Visit | Attending: Medical | Admitting: Medical

## 2020-03-18 ENCOUNTER — Encounter: Payer: Self-pay | Admitting: Medical

## 2020-03-18 DIAGNOSIS — N50811 Right testicular pain: Secondary | ICD-10-CM | POA: Diagnosis not present

## 2020-03-22 DIAGNOSIS — N401 Enlarged prostate with lower urinary tract symptoms: Secondary | ICD-10-CM | POA: Diagnosis not present

## 2020-03-22 DIAGNOSIS — N138 Other obstructive and reflux uropathy: Secondary | ICD-10-CM | POA: Diagnosis not present

## 2020-03-29 DIAGNOSIS — R1031 Right lower quadrant pain: Secondary | ICD-10-CM | POA: Diagnosis not present

## 2020-03-29 DIAGNOSIS — N401 Enlarged prostate with lower urinary tract symptoms: Secondary | ICD-10-CM | POA: Diagnosis not present

## 2020-03-29 DIAGNOSIS — N529 Male erectile dysfunction, unspecified: Secondary | ICD-10-CM | POA: Diagnosis not present

## 2020-03-29 DIAGNOSIS — N138 Other obstructive and reflux uropathy: Secondary | ICD-10-CM | POA: Diagnosis not present

## 2020-03-29 DIAGNOSIS — N50811 Right testicular pain: Secondary | ICD-10-CM | POA: Diagnosis not present

## 2020-04-09 DIAGNOSIS — N50811 Right testicular pain: Secondary | ICD-10-CM | POA: Diagnosis not present

## 2020-04-09 DIAGNOSIS — N3289 Other specified disorders of bladder: Secondary | ICD-10-CM | POA: Diagnosis not present

## 2020-04-09 DIAGNOSIS — K449 Diaphragmatic hernia without obstruction or gangrene: Secondary | ICD-10-CM | POA: Diagnosis not present

## 2020-04-09 DIAGNOSIS — R1031 Right lower quadrant pain: Secondary | ICD-10-CM | POA: Diagnosis not present

## 2020-04-14 DIAGNOSIS — R1031 Right lower quadrant pain: Secondary | ICD-10-CM | POA: Diagnosis not present

## 2020-04-14 DIAGNOSIS — I10 Essential (primary) hypertension: Secondary | ICD-10-CM | POA: Diagnosis not present

## 2020-04-19 ENCOUNTER — Other Ambulatory Visit: Payer: Self-pay

## 2020-04-19 DIAGNOSIS — I10 Essential (primary) hypertension: Secondary | ICD-10-CM | POA: Insufficient documentation

## 2020-04-19 DIAGNOSIS — Z8619 Personal history of other infectious and parasitic diseases: Secondary | ICD-10-CM | POA: Insufficient documentation

## 2020-04-19 DIAGNOSIS — F419 Anxiety disorder, unspecified: Secondary | ICD-10-CM | POA: Insufficient documentation

## 2020-04-20 ENCOUNTER — Encounter: Payer: Self-pay | Admitting: Cardiology

## 2020-04-20 ENCOUNTER — Ambulatory Visit: Payer: BC Managed Care – PPO | Admitting: Cardiology

## 2020-04-20 ENCOUNTER — Other Ambulatory Visit: Payer: Self-pay

## 2020-04-20 VITALS — BP 144/82 | HR 86 | Ht 70.0 in | Wt 191.0 lb

## 2020-04-20 DIAGNOSIS — Z79899 Other long term (current) drug therapy: Secondary | ICD-10-CM

## 2020-04-20 DIAGNOSIS — R931 Abnormal findings on diagnostic imaging of heart and coronary circulation: Secondary | ICD-10-CM

## 2020-04-20 DIAGNOSIS — E782 Mixed hyperlipidemia: Secondary | ICD-10-CM | POA: Diagnosis not present

## 2020-04-20 DIAGNOSIS — I10 Essential (primary) hypertension: Secondary | ICD-10-CM

## 2020-04-20 NOTE — Patient Instructions (Signed)

## 2020-04-20 NOTE — Progress Notes (Signed)
Cardiology Office Note:    Date:  04/20/2020   ID:  Francisco Lopez, DOB 1961/04/18, MRN [id number]  PCP:  [name], NP  Cardiologist:  Jenean Lindau, MD   Referring MD: [name], NP    ASSESSMENT:    1. Elevated coronary artery calcium score   2. Primary hypertension   3. Mixed hyperlipidemia    PLAN:    In order of problems listed above:  1. Primary prevention stressed with the patient.  Importance of compliance with diet medication stressed any vocalized understanding.  He is walking 30 minutes a day and I congratulated him about it. 2. Essential hypertension: Blood pressure stable and diet was emphasized and he promises to be watchful of his diet 3. Mixed dyslipidemia: Diet was emphasized.  He promises to be active and I told him my goal LDL for him is less than 70.  He will be back in the next few days for blood work. 4. Elevated calcium score: Aggressive primary/secondary prevention discussed with the patient and questions were answered to satisfaction. 5. Patient will be seen in follow-up appointment in 6 months or earlier if the patient has any concerns    Medication Adjustments/Labs and Tests Ordered: Current medicines are reviewed at length with the patient today.  Concerns regarding medicines are outlined above.  No orders of the defined types were placed in this encounter.  No orders of the defined types were placed in this encounter.    No chief complaint on file.    History of Present Illness:    Francisco Lopez is a 59 y.o. male.  Patient has past medical history of elevated calcium score, essential hypertension and dyslipidemia.  He denies any problems at this time and takes care of activities of daily living.  No chest pain orthopnea or PND.  He exercises on a regular basis.  At the time of my evaluation, the patient is alert awake oriented and in no distress.  Past Medical History:  Diagnosis Date  . Anxiety   . Atypical chest pain  08/01/2019  . Basal cell carcinoma 2006  . BPH with obstruction/lower urinary tract symptoms 10/19/2014  . Carpal tunnel syndrome 02/07/2016  . Chronic rhinitis 07/24/2016  . Depression   . Diastolic dysfunction 6/60/6301   Per 2D echo 8/16  . ED (erectile dysfunction) of organic origin 09/16/2014  . Elevated coronary artery calcium score 02/06/2020  . Frozen shoulder 2018   left  . GERD (gastroesophageal reflux disease) 12/27/2015  . Herpes simplex type 1 antibody positive 01/28/2015  . Herpes simplex type 2 infection 01/28/2015  . History of chicken pox   . History of obstructive sleep apnea 10/29/2015  . HTN (hypertension) 08/05/2014  . Hyperlipidemia   . Hypertension   . Left shoulder pain 02/20/2017  . Mood disorder ([place]) 08/25/2016  . Preventative health care 09/16/2014    Past Surgical History:  Procedure Laterality Date  . KNEE SURGERY Left 2000   meninscus repair  . SHOULDER ARTHROSCOPY Left 01/2018    Current Medications: Current Meds  Medication Sig  . amLODipine (NORVASC) 10 MG tablet Take 1 tablet (10 mg total) by mouth daily.  Marland Kitchen aspirin EC 81 MG tablet Take 1 tablet (81 mg total) by mouth daily. Swallow whole.  Marland Kitchen atorvastatin (LIPITOR) 40 MG tablet Take 1 tablet (40 mg total) by mouth daily.  . divalproex (DEPAKOTE) 250 MG DR tablet TAKE 2 TO 3 TABLETS BY MOUTH EVERY EVENING  . fenofibrate (TRICOR) 145 MG tablet  TAKE 1 TABLET (145 MG TOTAL) BY MOUTH DAILY.  . meloxicam (MOBIC) 7.5 MG tablet TAKE 1 TABLET BY MOUTH ONCE DAILY AS NEEDED FOR PAIN  . Multiple Vitamins-Minerals (MENS MULTIVITAMIN PLUS PO) Take by mouth.  . Omega-3 Fatty Acids (FISH OIL) 1000 MG CAPS Take 2 capsules (2,000 mg total) by mouth 2 (two) times daily.  Marland Kitchen omeprazole (PRILOSEC) 20 MG capsule Take 1 capsule (20 mg total) by mouth daily.  . QUEtiapine (SEROQUEL) 100 MG tablet Take 200 mg by mouth at bedtime.   . tadalafil (CIALIS) 5 MG tablet Take 5 mg by mouth daily as needed.  . valACYclovir (VALTREX) 500  MG tablet TAKE 1 TABLET BY MOUTH EVERY DAY     Allergies:   Patient has no known allergies.   Social History   Socioeconomic History  . Marital status: Divorced    Spouse name: Not on file  . Number of children: Not on file  . Years of education: Not on file  . Highest education level: Not on file  Occupational History  . Not on file  Tobacco Use  . Smoking status: Never Smoker  . Smokeless tobacco: Never Used  Substance and Sexual Activity  . Alcohol use: No  . Drug use: No  . Sexual activity: Yes  Other Topics Concern  . 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 Difficulty of Paying Living Expenses: Not on file  Food Insecurity:   . Worried About Charity fundraiser in the Last Year: Not on file  . Ran Out of Food in the Last Year: Not on file  Transportation Needs:   . Lack of Transportation (Medical): Not on file  . Lack of Transportation (Non-Medical): Not on file  Physical Activity:   . Days of Exercise per Week: Not on file  . Minutes of Exercise per Session: Not on file  Stress:   . Feeling of Stress : Not on file  Social Connections:   . Frequency of Communication with Friends and Family: Not on file  . Frequency of Social Gatherings with Friends and Family: Not on file  . Attends Religious Services: Not on file  . Active Member of Clubs or Organizations: Not on file  . Attends Archivist Meetings: Not on file  . Marital Status: Not on file     Family History: The patient's family history includes Alcohol abuse in his father; Anxiety disorder in his mother; Diabetes in his mother; Hypertension in his father.  ROS:   Please see the history of present illness.    All other systems reviewed and are negative.  EKGs/Labs/Other Studies Reviewed:    The following  studies were reviewed today: I discussed my findings with the patient at length   Recent Labs: 08/01/2019: Hemoglobin 15.6; Platelets 145.0 02/17/2020: ALT 24; BUN 18; Creatinine, Ser 0.97; Potassium 4.2; Sodium 143; TSH 1.410  Recent Lipid Panel    Component Value Date/Time   CHOL 161 02/17/2020 1016   TRIG 155 (H) 02/17/2020 1016   HDL 36 (L) 02/17/2020 1016   CHOLHDL 4.5 02/17/2020 1016   CHOLHDL 4 08/01/2019 1353   VLDL 34.2 08/01/2019 1353   LDLCALC 98 02/17/2020 1016   LDLDIRECT 97.0 12/27/2015 1651    Physical Exam:  VS:  BP (!) 144/82   Pulse 86   Ht 5\' 10"  (1.778 m)   Wt 191 lb (86.6 kg)   SpO2 96%   BMI 27.41 kg/m     Wt Readings from Last 3 Encounters:  04/20/20 191 lb (86.6 kg)  03/17/20 190 lb 3.2 oz (86.3 kg)  02/06/20 191 lb 1.3 oz (86.7 kg)     GEN: Patient is in no acute distress HEENT: Normal NECK: No JVD; No carotid bruits LYMPHATICS: No lymphadenopathy CARDIAC: Hear sounds regular, 2/6 systolic murmur at the apex. RESPIRATORY:  Clear to auscultation without rales, wheezing or rhonchi  ABDOMEN: Soft, non-tender, non-distended MUSCULOSKELETAL:  No edema; No deformity  SKIN: Warm and dry NEUROLOGIC:  Alert and oriented x 3 PSYCHIATRIC:  Normal affect   Signed, Jenean Lindau, MD  04/20/2020 4:08 PM    [facility]

## 2020-04-28 DIAGNOSIS — E782 Mixed hyperlipidemia: Secondary | ICD-10-CM | POA: Diagnosis not present

## 2020-04-28 DIAGNOSIS — I10 Essential (primary) hypertension: Secondary | ICD-10-CM | POA: Diagnosis not present

## 2020-04-29 LAB — LIPID PANEL
Chol/HDL Ratio: 3.6 ratio (ref 0.0–5.0)
Cholesterol, Total: 189 mg/dL (ref 100–199)
HDL: 52 mg/dL (ref >39)
LDL Chol Calc (NIH): 122 mg/dL — ABNORMAL HIGH (ref 0–99)
Triglycerides: 81 mg/dL (ref 0–149)
VLDL Cholesterol Cal: 15 mg/dL (ref 5–40)

## 2020-04-29 LAB — BASIC METABOLIC PANEL
BUN/Creatinine Ratio: 14 (ref 9–20)
BUN: 15 mg/dL (ref 6–24)
CO2: 22 mmol/L (ref 20–29)
Calcium: 9.7 mg/dL (ref 8.7–10.2)
Chloride: 106 mmol/L (ref 96–106)
Creatinine, Ser: 1.08 mg/dL (ref 0.76–1.27)
GFR calc Af Amer: 86 mL/min/{1.73_m2} (ref >59)
GFR calc non Af Amer: 75 mL/min/{1.73_m2} (ref >59)
Glucose: 102 mg/dL — ABNORMAL HIGH (ref 65–99)
Potassium: 4.4 mmol/L (ref 3.5–5.2)
Sodium: 141 mmol/L (ref 134–144)

## 2020-04-29 LAB — HEPATIC FUNCTION PANEL
ALT: 20 IU/L (ref 0–44)
AST: 13 IU/L (ref 0–40)
Albumin: 4.6 g/dL (ref 3.8–4.9)
Alkaline Phosphatase: 46 IU/L (ref 44–121)
Bilirubin Total: 0.4 mg/dL (ref 0.0–1.2)
Bilirubin, Direct: 0.11 mg/dL (ref 0.00–0.40)
Total Protein: 6.9 g/dL (ref 6.0–8.5)

## 2020-04-29 MED ORDER — ATORVASTATIN CALCIUM 80 MG PO TABS: 80.0000 mg | ORAL_TABLET | Freq: Every day | ORAL | 3 refills | Status: DC

## 2020-04-29 NOTE — Addendum Note (Signed)
Addended by: Truddie Hidden on: 04/29/2020 03:56 PM   Modules accepted: Orders

## 2020-04-30 ENCOUNTER — Other Ambulatory Visit: Payer: Self-pay | Admitting: Family

## 2020-05-25 ENCOUNTER — Other Ambulatory Visit: Payer: Self-pay | Admitting: Family

## 2020-06-01 ENCOUNTER — Encounter: Payer: Self-pay | Admitting: Family

## 2020-06-01 ENCOUNTER — Other Ambulatory Visit: Payer: Self-pay

## 2020-06-01 ENCOUNTER — Ambulatory Visit: Payer: BC Managed Care – PPO | Admitting: Family

## 2020-06-01 VITALS — BP 157/92 | HR 52 | Temp 98.6°F | Resp 16 | Ht 70.0 in | Wt 192.0 lb

## 2020-06-01 DIAGNOSIS — B009 Herpesviral infection, unspecified: Secondary | ICD-10-CM

## 2020-06-01 DIAGNOSIS — R109 Unspecified abdominal pain: Secondary | ICD-10-CM | POA: Diagnosis not present

## 2020-06-01 DIAGNOSIS — K219 Gastro-esophageal reflux disease without esophagitis: Secondary | ICD-10-CM

## 2020-06-01 DIAGNOSIS — I10 Essential (primary) hypertension: Secondary | ICD-10-CM

## 2020-06-01 DIAGNOSIS — K5900 Constipation, unspecified: Secondary | ICD-10-CM | POA: Diagnosis not present

## 2020-06-01 DIAGNOSIS — F32A Depression, unspecified: Secondary | ICD-10-CM

## 2020-06-01 DIAGNOSIS — M5416 Radiculopathy, lumbar region: Secondary | ICD-10-CM

## 2020-06-01 DIAGNOSIS — E782 Mixed hyperlipidemia: Secondary | ICD-10-CM

## 2020-06-01 MED ORDER — VALSARTAN 80 MG PO TABS: 80.0000 mg | ORAL_TABLET | Freq: Every day | ORAL | 3 refills | Status: DC

## 2020-06-01 MED ORDER — MELOXICAM 7.5 MG PO TABS: 7.5000 mg | ORAL_TABLET | Freq: Every day | ORAL | 0 refills | Status: DC | PRN

## 2020-06-01 NOTE — Progress Notes (Signed)
Subjective:    Patient ID: Francisco Lopez, male    DOB: 1960-10-05, 59 y.o.   MRN: [id number]  HPI  Patient is a 59 yr old male who presents today for follow up.  Atypical chest pain- had elevated coronary artery calcium score.   Depression- continues to follow with Dr. Toy Care. Reports mood is good.    HTN- maintained on amlodipine 10 mg once daily. He takes in the AM.  States that he took it in the AM.  He is also on aspirin 81mg .    BP Readings from Last 3 Encounters:  06/01/20 (!) 157/92  04/20/20 (!) 144/82  03/17/20 (!) 150/78   GERD- maintained on omeprazole. Reports occasional symptoms about once a week that requires a tums.    Hyperlipidemia- on tricor 145 mg and lipitor 80 mg. He is scheduled for a follow up lipid panel.   Lab Results  Component Value Date   CHOL 189 04/28/2020   HDL 52 04/28/2020   LDLCALC 122 (H) 04/28/2020   LDLDIRECT 97.0 12/27/2015   TRIG 81 04/28/2020   CHOLHDL 3.6 04/28/2020   HSV2- maintained on valtrex 500mg  once daily. Reports no breakouts.    States that he had a CT scan of his abdomen and was told he had acute appendicitis.  He then went to see a Psychologist, sport and exercise in [place].  Reports that over the past 3 weeks he has a "grinding pain" in the right side of his abdomen.  Feels like a knot.   He reports that recently he has had increased constipation.   Pain radiates into the right testicle.     Review of Systems See HPI  Past Medical History:  Diagnosis Date  . Anxiety   . Atypical chest pain 08/01/2019  . Basal cell carcinoma 2006  . BPH with obstruction/lower urinary tract symptoms 10/19/2014  . Carpal tunnel syndrome 02/07/2016  . Chronic rhinitis 07/24/2016  . Depression   . Diastolic dysfunction [id number]   Per 2D echo 8/16  . ED (erectile dysfunction) of organic origin 09/16/2014  . Elevated coronary artery calcium score 02/06/2020  . Frozen shoulder 2018   left  . GERD (gastroesophageal reflux disease) 12/27/2015  . Herpes simplex type  1 antibody positive 01/28/2015  . Herpes simplex type 2 infection 01/28/2015  . History of chicken pox   . History of obstructive sleep apnea 10/29/2015  . HTN (hypertension) 08/05/2014  . Hyperlipidemia   . Hypertension   . Left shoulder pain 02/20/2017  . Mood disorder ([place]) 08/25/2016  . Preventative health care 09/16/2014     Social History   Socioeconomic History  . Marital status: Divorced    Spouse name: Not on file  . Number of children: Not on file  . Years of education: Not on file  . Highest education level: Not on file  Occupational History  . Not on file  Tobacco Use  . Smoking status: Never Smoker  . Smokeless tobacco: Never Used  Substance and Sexual Activity  . Alcohol use: No  . Drug use: No  . Sexual activity: Yes  Other Topics Concern  . 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 KNEE SURGERY Left 2000   meninscus repair  . SHOULDER ARTHROSCOPY Left 01/2018    Family History  Problem Relation Age of Onset  . Diabetes Mother        type II  . Anxiety disorder Mother   . Hypertension Father   . Alcohol abuse Father     No Known Allergies  Current Outpatient Medications on File Prior to Visit  Medication Sig Dispense Refill  . aspirin EC 81 MG tablet Take 1 tablet (81 mg total) by mouth daily. Swallow whole. 90 tablet 3  . atorvastatin (LIPITOR) 80 MG tablet Take 1 tablet (80 mg total) by mouth daily. 90 tablet 3  . divalproex (DEPAKOTE) 250 MG DR tablet TAKE 2 TO 3 TABLETS BY MOUTH EVERY EVENING  12  .  fenofibrate (TRICOR) 145 MG tablet TAKE 1 TABLET (145 MG TOTAL) BY MOUTH DAILY. 30 tablet 5  . meloxicam (MOBIC) 7.5 MG tablet TAKE 1 TABLET BY MOUTH ONCE DAILY AS NEEDED FOR PAIN 30 tablet 0  . Multiple Vitamins-Minerals (MENS MULTIVITAMIN PLUS PO) Take by mouth.    . Omega-3 Fatty Acids (FISH OIL) 1000 MG CAPS Take 2 capsules (2,000 mg total) by mouth 2 (two) times daily. 180 capsule 3  . omeprazole (PRILOSEC) 20 MG capsule Take 1 capsule (20 mg total) by mouth daily. 90 capsule 1  . QUEtiapine (SEROQUEL) 100 MG tablet Take 200 mg by mouth at bedtime.     . tadalafil (CIALIS) 5 MG tablet Take 5 mg by mouth daily as needed.    . valACYclovir (VALTREX) 500 MG tablet Take 1 tablet (500 mg total) by mouth daily. 90 tablet 3  . amLODipine (NORVASC) 10 MG tablet Take 1 tablet (10 mg total) by mouth daily. 180 tablet 3   No current facility-administered medications on file prior to visit.    BP (!) 157/92 (BP Location: Right Arm, Patient Position: Sitting, Cuff Size: Small)   Pulse (!) 52   Temp 98.6 F (37 C) (Oral)   Resp 16   Ht 5\' 10"  (1.778 m)   Wt 192 lb (87.1 kg)   SpO2 99%   BMI 27.55 kg/m       Objective:   Physical Exam Constitutional:      General: He is not in acute distress.    Appearance: He is well-developed and well-nourished.  HENT:     Head: Normocephalic and atraumatic.  Cardiovascular:     Rate and Rhythm: Normal rate and regular rhythm.     Heart sounds: No murmur heard.   Pulmonary:     Effort: Pulmonary effort is normal. No respiratory distress.     Breath sounds: Normal breath sounds. No wheezing or rales.  Abdominal:     General: Bowel sounds are normal.     Palpations: Abdomen is soft.     Comments: Mild RLQ tenderness without guarding  Musculoskeletal:        General: No edema.  Skin:    General: Skin is warm and dry.  Neurological:     Mental Status: He is alert and oriented to person, place, and time.  Psychiatric:        Mood and Affect:  Mood and affect normal.        Behavior: Behavior normal.  Thought Content: Thought content normal.           Assessment & Plan:  HTN- repeat manual bp check:  BP 150/95. BP is uncontrolled. Continue amlodipine 10 mg once daily.  Add valsartan 80 mg once daily.   Depression- stable. Management per psych.   Abdominal pain- he saw a surgeon who did not think that this was related to appendicitis despite findings on CT.    Impression from CT 04/09/20 noted:   IMPRESSION:  1. The appendix is slightly dilated measuring up to 8 mm in diameter  with very subtle periappendiceal fat stranding. Findings are  equivocal for early acute appendicitis.  2. Moderate-large volume of stool within the rectum.  3. Urinary bladder wall is slightly thickened. Correlate with  urinalysis to exclude cystitis.  4. Small sliding type hiatal hernia.  5. Aortic atherosclerosis. (ICD10-I70.0).   Will check a follow up CBC today to assess for leukocytosis.  He understands that if his pain worsens, that he should go to the ER.   My impression is that his pain is likely related to constipation and possibly an L1 radiculopathy.  Will rx with metamucil once daily and also recommended that he increase water intake.  Also, will give trial of meloxicam to see if it helps with back pain and ?R L1 Radiculopathy. It has also been 9 years since his last colonoscopy and he would like to follow up with gastroenterology. Will refer.   Vaccine counselling-  His sister who was unvaccinated died from covid. He is hesitant but  will consider it.  HSV2- stable with daily valtrex 500mg . Continue same.   Hyperlipidemia- will have follow up labs with cardiology following medication adjustment.  GERD- stable on PPI. Continue same.  This visit occurred during the SARS-CoV-2 public health emergency.  Safety protocols were in place, including screening questions prior to the visit, additional usage of staff PPE, and  extensive cleaning of exam room while observing appropriate contact time as indicated for disinfecting solutions.

## 2020-06-01 NOTE — Patient Instructions (Addendum)
Please start meloxicam once daily for pain. Add metamucil once daily. You should be contacted about your appointment with the gastroenterologist. Please add valsartan 80mg  once daily for blood pressure.

## 2020-06-02 LAB — CBC WITH DIFFERENTIAL/PLATELET
Basophils Absolute: 0.1 10*3/uL (ref 0.0–0.1)
Basophils Relative: 0.8 % (ref 0.0–3.0)
Eosinophils Absolute: 0.1 10*3/uL (ref 0.0–0.7)
Eosinophils Relative: 0.8 % (ref 0.0–5.0)
HCT: 46.2 % (ref 39.0–52.0)
Hemoglobin: 15.7 g/dL (ref 13.0–17.0)
Lymphocytes Relative: 35.7 % (ref 12.0–46.0)
Lymphs Abs: 2.5 10*3/uL (ref 0.7–4.0)
MCHC: 34.1 g/dL (ref 30.0–36.0)
MCV: 89.6 fl (ref 78.0–100.0)
Monocytes Absolute: 0.5 10*3/uL (ref 0.1–1.0)
Monocytes Relative: 7.7 % (ref 3.0–12.0)
Neutro Abs: 3.9 10*3/uL (ref 1.4–7.7)
Neutrophils Relative %: 55 % (ref 43.0–77.0)
Platelets: 162 10*3/uL (ref 150.0–400.0)
RBC: 5.15 Mil/uL (ref 4.22–5.81)
RDW: 13.5 % (ref 11.5–15.5)
WBC: 7 10*3/uL (ref 4.0–10.5)

## 2020-06-07 ENCOUNTER — Ambulatory Visit: Payer: BC Managed Care – PPO | Admitting: Family

## 2020-06-15 ENCOUNTER — Ambulatory Visit: Payer: BC Managed Care – PPO | Admitting: Family

## 2020-06-17 ENCOUNTER — Other Ambulatory Visit: Payer: Self-pay | Admitting: Family

## 2020-06-17 ENCOUNTER — Encounter: Payer: Self-pay | Admitting: Gastroenterology

## 2020-06-18 ENCOUNTER — Ambulatory Visit: Payer: BC Managed Care – PPO | Admitting: Family

## 2020-06-18 ENCOUNTER — Other Ambulatory Visit: Payer: Self-pay

## 2020-06-18 ENCOUNTER — Ambulatory Visit (HOSPITAL_BASED_OUTPATIENT_CLINIC_OR_DEPARTMENT_OTHER)
Admission: RE | Admit: 2020-06-18 | Discharge: 2020-06-18 | Disposition: A | Discharge status: Home / Self Care | Payer: BC Managed Care – PPO | Source: Ambulatory Visit | Attending: Family | Admitting: Family

## 2020-06-18 VITALS — BP 130/87 | HR 74 | Temp 98.2°F | Resp 16 | Wt 192.0 lb

## 2020-06-18 DIAGNOSIS — K5900 Constipation, unspecified: Secondary | ICD-10-CM | POA: Diagnosis not present

## 2020-06-18 DIAGNOSIS — I10 Essential (primary) hypertension: Secondary | ICD-10-CM | POA: Diagnosis not present

## 2020-06-18 DIAGNOSIS — M5416 Radiculopathy, lumbar region: Secondary | ICD-10-CM

## 2020-06-18 DIAGNOSIS — M545 Low back pain, unspecified: Secondary | ICD-10-CM | POA: Diagnosis not present

## 2020-06-18 NOTE — Progress Notes (Signed)
Subjective:    Patient ID: Francisco Lopez, male    DOB: [birth year], 60 y.o.   MRN: [id number]  HPI  Patient is a 60 yr old male who presents today for follow up.  Last visit his BP was noted to be elevated and we added valsartan 35m.   Abdominal pain- last visit we advised pt to add metamucil and increase his water intake to help with constipation symptoms. He started eting the fiber bars.  Helped "a little bit." Drinking more water as well. He notes that stools are still hard. He has had slight increased frequency of stooling however.  Lumbar radiculopathy- reports that it is about the same as the last time we met. Pain radiates into the right lower abdomen and down into the groin.  Review of Systems See HPI  Past Medical History:  Diagnosis Date  . Anxiety   . Atypical chest pain 08/01/2019  . Basal cell carcinoma 2006  . BPH with obstruction/lower urinary tract symptoms 10/19/2014  . Carpal tunnel syndrome 02/07/2016  . Chronic rhinitis 07/24/2016  . Depression   . Diastolic dysfunction 84/73/4037  Per 2D echo 8/16  . ED (erectile dysfunction) of organic origin 09/16/2014  . Elevated coronary artery calcium score 02/06/2020  . Frozen shoulder 2018   left  . GERD (gastroesophageal reflux disease) 12/27/2015  . Herpes simplex type 1 antibody positive 01/28/2015  . Herpes simplex type 2 infection 01/28/2015  . History of chicken pox   . History of obstructive sleep apnea 10/29/2015  . HTN (hypertension) 08/05/2014  . Hyperlipidemia   . Hypertension   . Left shoulder pain 02/20/2017  . Mood disorder [place] 08/25/2016  . Preventative health care 09/16/2014     Social History   Socioeconomic History  . Marital status: Divorced    Spouse name: Not on file  . Number of children: Not on file  . Years of education: Not on file  . Highest education level: Not on file  Occupational History  . Not on file  Tobacco Use  . Smoking status: Never Smoker  . Smokeless tobacco: Never Used   Substance and Sexual Activity  . Alcohol use: No  . Drug use: No  . Sexual activity: Yes  Other Topics Concern  . Not on file  Social History Narrative   Separated   1 child ((6yr old daughter)- 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[employer] 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   Social Determinants of Health   Financial Resource Strain: Not on file  Food Insecurity: Not on file  Transportation Needs: Not on file  Physical Activity: Not on file  Stress: Not on file  Social Connections: Not on file  Intimate Partner Violence: Not on file    Past Surgical History:  Procedure Laterality Date  . KNEE SURGERY Left 2000   meninscus repair  . SHOULDER ARTHROSCOPY Left 01/2018    Family History  Problem Relation Age of Onset  . Diabetes Mother        type II  . Anxiety disorder Mother   . Hypertension Father   . Alcohol abuse Father     No Known Allergies  Current Outpatient Medications on File Prior to Visit  Medication Sig Dispense Refill  . aspirin EC 81 MG tablet Take 1 tablet (81 mg total) by mouth daily. Swallow whole. 90 tablet 3  . atorvastatin (LIPITOR) 80 MG tablet Take 1 tablet (80 mg total) by mouth daily. [place]  tablet 3  . divalproex (DEPAKOTE) 250 MG DR tablet TAKE 2 TO 3 TABLETS BY MOUTH EVERY EVENING  12  . fenofibrate (TRICOR) 145 MG tablet TAKE 1 TABLET (145 MG TOTAL) BY MOUTH DAILY. 30 tablet 5  . meloxicam (MOBIC) 7.5 MG tablet Take 1 tablet (7.5 mg total) by mouth daily as needed for pain. 14 tablet 0  . Multiple Vitamins-Minerals (MENS MULTIVITAMIN PLUS PO) Take by mouth.    . Omega-3 Fatty Acids (FISH OIL) 1000 MG CAPS Take 2 capsules (2,000 mg total) by mouth 2 (two) times daily. 180 capsule 3  . omeprazole (PRILOSEC) 20 MG capsule Take 1 capsule (20 mg total) by mouth daily. 90 capsule 1  . QUEtiapine (SEROQUEL) 100 MG tablet Take 200 mg by mouth at bedtime.     . tadalafil (CIALIS) 5 MG tablet Take 5 mg by mouth daily as needed.    . valACYclovir  (VALTREX) 500 MG tablet Take 1 tablet (500 mg total) by mouth daily. 90 tablet 3  . valsartan (DIOVAN) 80 MG tablet Take 1 tablet (80 mg total) by mouth daily. 30 tablet 3  . amLODipine (NORVASC) 10 MG tablet Take 1 tablet (10 mg total) by mouth daily. 180 tablet 3   No current facility-administered medications on file prior to visit.    BP 130/87 (BP Location: Right Arm, Patient Position: Sitting, Cuff Size: Small)   Pulse 74   Temp 98.2 F (36.8 C) (Oral)   Resp 16   Wt 192 lb (87.1 kg)   SpO2 97%   BMI 27.55 kg/m       Objective:   Physical Exam Exam conducted with a chaperone present.  Constitutional:      General: He is not in acute distress.    Appearance: He is well-developed and well-nourished.  HENT:     Head: Normocephalic and atraumatic.  Cardiovascular:     Rate and Rhythm: Normal rate and regular rhythm.     Heart sounds: No murmur heard.   Pulmonary:     Effort: Pulmonary effort is normal. No respiratory distress.     Breath sounds: Normal breath sounds. No wheezing or rales.  Abdominal:     Palpations: Abdomen is soft.     Tenderness: There is no abdominal tenderness. There is no guarding.  Genitourinary:    Comments: No palpable right inguinal hernia on exam Musculoskeletal:        General: No edema.  Skin:    General: Skin is warm and dry.  Neurological:     Mental Status: He is alert and oriented to person, place, and time.  Psychiatric:        Mood and Affect: Mood and affect normal.        Behavior: Behavior normal.        Thought Content: Thought content normal.           Assessment & Plan:  Constipation-he notes some improvement but this is not completely resolved. At this point I recommended the following:  Please stop metamucil for now.  Instead, add miralax 1 capful in 8 oz of liquid once daily. Goal is 1-2 soft BM's a day.  If you remain constipated, you can increase to 1.5 caps, then 2 caps daily etc. As needed.  If you develop  diarrhea, please cut back.  Continue to drink plenty of water.    Lumbar radiculopathy-patient describes pain in the right lower abdomen radiating into the right scrotum. This seems to follow an L1  right-sided dermatome. Will obtain an x-ray of the lumbar spine. In addition I have given him some back exercises to perform at home. Continue meloxicam 7.5 mg p.o. as needed.  Hypertension-blood pressure is improved. Continue valsartan 80 mg once daily. Will obtain follow-up basic metabolic panel.  This visit occurred during the SARS-CoV-2 public health emergency.  Safety protocols were in place, including screening questions prior to the visit, additional usage of staff PPE, and extensive cleaning of exam room while observing appropriate contact time as indicated for disinfecting solutions.

## 2020-06-18 NOTE — Patient Instructions (Addendum)
Please stop metamucil for now.  Instead, add miralax 1 capful in 8 oz of liquid once daily. Goal is 1-2 soft BM's a day.  If you remain constipated, you can increase to 1.5 caps, then 2 caps daily etc. As needed.  If you develop diarrhea, please cut back.  Continue to drink plenty of water.   Please complete the xray on the first floor.    Back Exercises The following exercises strengthen the muscles that help to support the trunk and back. They also help to keep the lower back flexible. Doing these exercises can help to prevent back pain or lessen existing pain.  If you have back pain or discomfort, try doing these exercises 2-3 times each day or as told by your health care provider.  As your pain improves, do them once each day, but increase the number of times that you repeat the steps for each exercise (do more repetitions).  To prevent the recurrence of back pain, continue to do these exercises once each day or as told by your health care provider. Do exercises exactly as told by your health care provider and adjust them as directed. It is normal to feel mild stretching, pulling, tightness, or discomfort as you do these exercises, but you should stop right away if you feel sudden pain or your pain gets worse. Exercises Single knee to chest Repeat these steps 3-5 times for each leg: 1. Lie on your back on a firm bed or the floor with your legs extended. 2. Bring one knee to your chest. Your other leg should stay extended and in contact with the floor. 3. Hold your knee in place by grabbing your knee or thigh with both hands and hold. 4. Pull on your knee until you feel a gentle stretch in your lower back or buttocks. 5. Hold the stretch for 10-30 seconds. 6. Slowly release and straighten your leg. Pelvic tilt Repeat these steps 5-10 times: 1. Lie on your back on a firm bed or the floor with your legs extended. 2. Bend your knees so they are pointing toward the ceiling and your feet are  flat on the floor. 3. Tighten your lower abdominal muscles to press your lower back against the floor. This motion will tilt your pelvis so your tailbone points up toward the ceiling instead of pointing to your feet or the floor. 4. With gentle tension and even breathing, hold this position for 5-10 seconds. Cat-cow Repeat these steps until your lower back becomes more flexible: 1. Get into a hands-and-knees position on a firm surface. Keep your hands under your shoulders, and keep your knees under your hips. You may place padding under your knees for comfort. 2. Let your head hang down toward your chest. Contract your abdominal muscles and point your tailbone toward the floor so your lower back becomes rounded like the back of a cat. 3. Hold this position for 5 seconds. 4. Slowly lift your head, let your abdominal muscles relax and point your tailbone up toward the ceiling so your back forms a sagging arch like the back of a cow. 5. Hold this position for 5 seconds.  Press-ups Repeat these steps 5-10 times: 1. Lie on your abdomen (face-down) on the floor. 2. Place your palms near your head, about shoulder-width apart. 3. Keeping your back as relaxed as possible and keeping your hips on the floor, slowly straighten your arms to raise the top half of your body and lift your shoulders. Do not use  your back muscles to raise your upper torso. You may adjust the placement of your hands to make yourself more comfortable. 4. Hold this position for 5 seconds while you keep your back relaxed. 5. Slowly return to lying flat on the floor.  Bridges Repeat these steps 10 times: 1. Lie on your back on a firm surface. 2. Bend your knees so they are pointing toward the ceiling and your feet are flat on the floor. Your arms should be flat at your sides, next to your body. 3. Tighten your buttocks muscles and lift your buttocks off the floor until your waist is at almost the same height as your knees. You  should feel the muscles working in your buttocks and the back of your thighs. If you do not feel these muscles, slide your feet 1-2 inches farther away from your buttocks. 4. Hold this position for 3-5 seconds. 5. Slowly lower your hips to the starting position, and allow your buttocks muscles to relax completely. If this exercise is too easy, try doing it with your arms crossed over your chest. Abdominal crunches Repeat these steps 5-10 times: 1. Lie on your back on a firm bed or the floor with your legs extended. 2. Bend your knees so they are pointing toward the ceiling and your feet are flat on the floor. 3. Cross your arms over your chest. 4. Tip your chin slightly toward your chest without bending your neck. 5. Tighten your abdominal muscles and slowly raise your trunk (torso) high enough to lift your shoulder blades a tiny bit off the floor. Avoid raising your torso higher than that because it can put too much stress on your low back and does not help to strengthen your abdominal muscles. 6. Slowly return to your starting position. Back lifts Repeat these steps 5-10 times: 1. Lie on your abdomen (face-down) with your arms at your sides, and rest your forehead on the floor. 2. Tighten the muscles in your legs and your buttocks. 3. Slowly lift your chest off the floor while you keep your hips pressed to the floor. Keep the back of your head in line with the curve in your back. Your eyes should be looking at the floor. 4. Hold this position for 3-5 seconds. 5. Slowly return to your starting position. Contact a health care provider if:  Your back pain or discomfort gets much worse when you do an exercise.  Your worsening back pain or discomfort does not lessen within 2 hours after you exercise. If you have any of these problems, stop doing these exercises right away. Do not do them again unless your health care provider says that you can. Get help right away if:  You develop sudden,  severe back pain. If this happens, stop doing the exercises right away. Do not do them again unless your health care provider says that you can. This information is not intended to replace advice given to you by your health care provider. Make sure you discuss any questions you have with your health care provider. Document Revised: 10/03/2018 Document Reviewed: 02/28/2018 Elsevier Patient Education  Francisco Lopez.

## 2020-07-22 ENCOUNTER — Encounter: Payer: Self-pay | Admitting: Gastroenterology

## 2020-07-22 ENCOUNTER — Ambulatory Visit (INDEPENDENT_AMBULATORY_CARE_PROVIDER_SITE_OTHER): Payer: BC Managed Care – PPO | Admitting: Gastroenterology

## 2020-07-22 VITALS — BP 140/72 | HR 84 | Ht 71.0 in | Wt 190.1 lb

## 2020-07-22 DIAGNOSIS — K219 Gastro-esophageal reflux disease without esophagitis: Secondary | ICD-10-CM | POA: Diagnosis not present

## 2020-07-22 DIAGNOSIS — K5900 Constipation, unspecified: Secondary | ICD-10-CM

## 2020-07-22 DIAGNOSIS — R194 Change in bowel habit: Secondary | ICD-10-CM

## 2020-07-22 DIAGNOSIS — R1310 Dysphagia, unspecified: Secondary | ICD-10-CM

## 2020-07-22 DIAGNOSIS — R1031 Right lower quadrant pain: Secondary | ICD-10-CM | POA: Diagnosis not present

## 2020-07-22 NOTE — Progress Notes (Signed)
Chief Complaint: Abdominal pain, constipation   Referring Provider:     [name], NP   HPI:     Francisco Lopez is a 60 y.o. male with a history of HTN, HLD, mood disorder, depression, basal cell carcinoma, BPH, GERD, HSV, referred to the [facility]astro[facility]ology Clinic for evaluation of abdominal pain.  Developed RLQ/right flank pain radiating to the right testicle about 6 months ago.  Pain lasts throughout the day.  Had a nerve block for this several months ago with improvement for period time, but not resolution, but now pain essentially back to baseline.  Was evaluated by Urology at [facility] on 03/29/2020.  Evaluate CT, which was concerning for appendicitis and referred to the ER at [facility] on 04/09/2020 with evaluation as below:  -Normal CBC, CMP - CT abd/pelvis (03/2020): Slightly dilated appendix at 8 mm with subtle periappendiceal fat stranding.  Outpatient follow-up with surgery.  Moderate to large volume stool in the rectum.  Slightly thickened urinary bladder.  Small HH.  -Case discussed by ER staff with Surgery.  Treated with antibiotics without need for emergent surgery. -Patient had outpatient follow-up with surgery who recommended conservative management and felt pain not related to appendicitis from CT  -04/28/2020: Normal CMP -06/01/2020: Normal CBC  Today, he reports pain continues to occur.  No pain presently today. No associated hematochezia, melena. Decreased stool frequency from 2-3/day for whole life, and now 1 hard sotol/day with straining. Trialed Miaralx, now with soft, thin stools. No feels intemirttent hard knot in RLQ w/ spasm discomfort.   Separately, longstanding history of GERD.  Relatively well controlled with omeprazole 20, which he takes prn rather than daily, along with occasional Tums prn breakthrough. More recently with intermittent solid food dysphagia, pointing to mid sternum. Will need to flush with fluids and eventually passes.  Present for 1-2 months.   - Colonoscopy approx 7-8 years ago. Does not recall any polyps.  - Nor prior EGD   Past Medical History:  Diagnosis Date  . Anxiety   . Atypical chest pain 08/01/2019  . Basal cell carcinoma 2006  . BPH with obstruction/lower urinary tract symptoms 10/19/2014  . Carpal tunnel syndrome 02/07/2016  . Chronic rhinitis 07/24/2016  . Depression   . Diastolic dysfunction 02/07/9370   Per 2D echo 8/16  . ED (erectile dysfunction) of organic origin 09/16/2014  . Elevated coronary artery calcium score 02/06/2020  . Frozen shoulder 2018   left  . GERD (gastroesophageal reflux disease) 12/27/2015  . Herpes simplex type 1 antibody positive 01/28/2015  . Herpes simplex type 2 infection 01/28/2015  . History of chicken pox   . History of obstructive sleep apnea 10/29/2015  . HTN (hypertension) 08/05/2014  . Hyperlipidemia   . Hypertension   . Left shoulder pain 02/20/2017  . Mood disorder ([place]) 08/25/2016  . Preventative health care 09/16/2014     Past Surgical History:  Procedure Laterality Date  . COLONOSCOPY     around 2015 [facility] in [place]  . KNEE SURGERY Left 2000   meninscus repair  . SHOULDER ARTHROSCOPY Left 01/2018   Family History  Problem Relation Age of Onset  . Diabetes Mother        type II  . Anxiety disorder Mother   . Hypertension Father   . Alcohol abuse Father   . Colon cancer Neg Hx   . Esophageal cancer Neg Hx    Social History  Tobacco Use  . Smoking status: Never Smoker  . Smokeless tobacco: Never Used  Vaping Use  . Vaping Use: Never used  Substance Use Topics  . Alcohol use: No  . Drug use: No   Current Outpatient Medications  Medication Sig Dispense Refill  . amLODipine (NORVASC) 10 MG tablet Take 1 tablet (10 mg total) by mouth daily. 180 tablet 3  . aspirin EC 81 MG tablet Take 1 tablet (81 mg total) by mouth daily. Swallow whole. 90 tablet 3  . atorvastatin (LIPITOR) 80 MG tablet Take 1 tablet (80 mg total) by mouth  daily. 90 tablet 3  . divalproex (DEPAKOTE) 250 MG DR tablet TAKE 2 TO 3 TABLETS BY MOUTH EVERY EVENING  12  . fenofibrate (TRICOR) 145 MG tablet TAKE 1 TABLET (145 MG TOTAL) BY MOUTH DAILY. 30 tablet 5  . meloxicam (MOBIC) 7.5 MG tablet Take 1 tablet (7.5 mg total) by mouth daily as needed for pain. 14 tablet 0  . Multiple Vitamins-Minerals (MENS MULTIVITAMIN PLUS PO) Take 1 tablet by mouth daily in the afternoon.    . Omega-3 Fatty Acids (FISH OIL) 1000 MG CAPS Take 2 capsules (2,000 mg total) by mouth 2 (two) times daily. 180 capsule 3  . QUEtiapine (SEROQUEL) 400 MG tablet Take 400 mg by mouth at bedtime.    . tadalafil (CIALIS) 5 MG tablet Take 5 mg by mouth daily as needed.    . valACYclovir (VALTREX) 500 MG tablet Take 1 tablet (500 mg total) by mouth daily. 90 tablet 3  . valsartan (DIOVAN) 80 MG tablet Take 1 tablet (80 mg total) by mouth daily. 30 tablet 3  . omeprazole (PRILOSEC) 20 MG capsule Take 1 capsule (20 mg total) by mouth daily. 90 capsule 1   No current facility-administered medications for this visit.   No Known Allergies   Review of Systems: All systems reviewed and negative except where noted in HPI.     Physical Exam:    Wt Readings from Last 3 Encounters:  07/22/20 190 lb 2 oz (86.2 kg)  06/18/20 192 lb (87.1 kg)  06/01/20 192 lb (87.1 kg)    BP 140/72   Pulse 84   Ht 5\' 11"  (1.803 m)   Wt 190 lb 2 oz (86.2 kg)   BMI 26.52 kg/m  Constitutional:  Pleasant, in no acute distress. Psychiatric: Normal mood and affect. Behavior is normal. EENT: Pupils normal.  Conjunctivae are normal. No scleral icterus. Neck supple. No cervical LAD. Cardiovascular: Normal rate, regular rhythm. No edema Pulmonary/chest: Effort normal and breath sounds normal. No wheezing, rales or rhonchi. Abdominal: Soft, nondistended, nontender. Bowel sounds active throughout. There are no masses palpable. No hepatomegaly. Neurological: Alert and oriented to person place and  time. Skin: Skin is warm and dry. No rashes noted.   ASSESSMENT AND PLAN;   1) Constipation 2) RLQ pain 3) Change in stools -Colonoscopy to evaluate for mucosal/luminal pathology -Patient reports a "knot" in the RLQ.  No palpable lesion on exam today and no corresponding lesion on CT.  Can certainly evaluate for mucosal pathology with colonoscopy -Continue MiraLAX 1 cap/day for goal of soft stools without straining to have BM -Request notes from surgical evaluation  4) Dysphagia 5) GERD -EGD with esophageal dilation and biopsies as appropriate -For erosive esophagitis, LES laxity, and hernia -Start taking Prilosec daily -Antireflux lifestyle/dietary modifications  The indications, risks, and benefits of EGD and colonoscopy were explained to the patient in detail. Risks include but are not limited  to bleeding, perforation, adverse reaction to medications, and cardiopulmonary compromise. Sequelae include but are not limited to the possibility of surgery, hospitalization, and mortality. The patient verbalized understanding and wished to proceed. All questions answered, referred to scheduler and bowel prep ordered. Further recommendations pending results of the exam.       Lavena Bullion, DO, FACG  07/22/2020, 3:33 PM   [name], NP

## 2020-07-22 NOTE — Patient Instructions (Signed)
If you are age 60 or older, your body mass index should be between 23-30. Your Body mass index is 26.52 kg/m. If this is out of the aforementioned range listed, please consider follow up with your Primary Care Provider.  If you are age 64 or younger, your body mass index should be between 19-25. Your Body mass index is 26.52 kg/m. If this is out of the aformentioned range listed, please consider follow up with your Primary Care Provider.    It has been recommended to you by your physician that you have a(n) endoscopy and colonoscopy completed. Per your request, we did not schedule the procedure(s) today. Please contact our office at [phone] should you decide to have the procedure completed.  Due to recent changes in healthcare laws, you may see the results of your imaging and laboratory studies on MyChart before your provider has had a chance to review them.  We understand that in some cases there may be results that are confusing or concerning to you. Not all laboratory results come back in the same time frame and the provider may be waiting for multiple results in order to interpret others.  Please give [place] 48 hours in order for your provider to thoroughly review all the results before contacting the office for clarification of your results.   Thank you for choosing me and [facility].  Vito Cirigliano, D.O.

## 2020-07-23 ENCOUNTER — Encounter: Payer: Self-pay | Admitting: Gastroenterology

## 2020-08-16 ENCOUNTER — Ambulatory Visit (AMBULATORY_SURGERY_CENTER): Payer: BC Managed Care – PPO | Admitting: *Deleted

## 2020-08-16 ENCOUNTER — Other Ambulatory Visit: Payer: Self-pay

## 2020-08-16 VITALS — Ht 71.0 in | Wt 182.0 lb

## 2020-08-16 DIAGNOSIS — R194 Change in bowel habit: Secondary | ICD-10-CM

## 2020-08-16 DIAGNOSIS — K5900 Constipation, unspecified: Secondary | ICD-10-CM

## 2020-08-16 DIAGNOSIS — R1031 Right lower quadrant pain: Secondary | ICD-10-CM

## 2020-08-16 DIAGNOSIS — R1310 Dysphagia, unspecified: Secondary | ICD-10-CM

## 2020-08-16 DIAGNOSIS — K219 Gastro-esophageal reflux disease without esophagitis: Secondary | ICD-10-CM

## 2020-08-16 MED ORDER — PLENVU 140 G PO SOLR: 1.0000 | Freq: Once | ORAL | 0 refills | Status: AC

## 2020-08-16 NOTE — Progress Notes (Signed)
Patient's pre-visit was done today over the phone with the patient due to COVID-19 pandemic. Name,DOB and address verified. Insurance verified. Patient denies any allergies to Eggs and Soy. Patient denies any problems with anesthesia/sedation. Patient denies taking diet pills or blood thinners. Packet of Prep instructions mailed to patient including a copy of a consent form and pre-procedure patient acknowledgement form (with envelope to mail back to us)-pt is aware. [email or web address]  Prep coupon included. Patient understands to call us back with any questions or concerns.  Patient is aware of our care-partner policy and GZFPO-25 safety protocol. EMMI education assigned to the patient for the procedure, sent to [place]. Pt denies any loose teeth or dentures. Pt denies any medical hx changes since last GI OV.

## 2020-08-18 ENCOUNTER — Encounter: Payer: Self-pay | Admitting: Gastroenterology

## 2020-09-01 ENCOUNTER — Ambulatory Visit (AMBULATORY_SURGERY_CENTER): Payer: BC Managed Care – PPO | Admitting: Gastroenterology

## 2020-09-01 ENCOUNTER — Other Ambulatory Visit: Payer: Self-pay

## 2020-09-01 ENCOUNTER — Encounter: Payer: Self-pay | Admitting: Gastroenterology

## 2020-09-01 VITALS — BP 117/73 | HR 57 | Temp 97.5°F | Resp 15 | Ht 71.0 in | Wt 182.0 lb

## 2020-09-01 DIAGNOSIS — K319 Disease of stomach and duodenum, unspecified: Secondary | ICD-10-CM

## 2020-09-01 DIAGNOSIS — K449 Diaphragmatic hernia without obstruction or gangrene: Secondary | ICD-10-CM | POA: Diagnosis not present

## 2020-09-01 DIAGNOSIS — R1319 Other dysphagia: Secondary | ICD-10-CM

## 2020-09-01 DIAGNOSIS — R1310 Dysphagia, unspecified: Secondary | ICD-10-CM | POA: Diagnosis not present

## 2020-09-01 DIAGNOSIS — K2970 Gastritis, unspecified, without bleeding: Secondary | ICD-10-CM | POA: Diagnosis not present

## 2020-09-01 DIAGNOSIS — R194 Change in bowel habit: Secondary | ICD-10-CM

## 2020-09-01 DIAGNOSIS — K2100 Gastro-esophageal reflux disease with esophagitis, without bleeding: Secondary | ICD-10-CM

## 2020-09-01 DIAGNOSIS — K3189 Other diseases of stomach and duodenum: Secondary | ICD-10-CM | POA: Diagnosis not present

## 2020-09-01 DIAGNOSIS — K5900 Constipation, unspecified: Secondary | ICD-10-CM | POA: Diagnosis not present

## 2020-09-01 DIAGNOSIS — R1031 Right lower quadrant pain: Secondary | ICD-10-CM

## 2020-09-01 MED ORDER — PANTOPRAZOLE SODIUM 40 MG PO TBEC: 40.0000 mg | DELAYED_RELEASE_TABLET | Freq: Two times a day (BID) | ORAL | 1 refills | Status: DC

## 2020-09-01 MED ORDER — SODIUM CHLORIDE 0.9 % IV SOLN: 500.0000 mL | Freq: Once | INTRAVENOUS | Status: DC

## 2020-09-01 NOTE — Progress Notes (Signed)
Called to room to assist during endoscopic procedure.  Patient ID and intended procedure confirmed with present staff. Received instructions for my participation in the procedure from the performing physician.  

## 2020-09-01 NOTE — Op Note (Signed)
[name] Patient Name: Francisco Lopez Procedure Date: 09/01/2020 10:02 AM MRN: [id number] Endoscopist: Gerrit Heck , MD Age: 60 Referring MD:  Date of Birth: 03-10-1961 Gender: Male Account #: [id number] Procedure:                Colonoscopy Indications:              Abdominal pain in the right lower quadrant, Change                            in bowel habits, Constipation Medicines:                Monitored Anesthesia Care Procedure:                Pre-Anesthesia Assessment:                           - Prior to the procedure, a History and Physical                            was performed, and patient medications and                            allergies were reviewed. The patient's tolerance of                            previous anesthesia was also reviewed. The risks                            and benefits of the procedure and the sedation                            options and risks were discussed with the patient.                            All questions were answered, and informed consent                            was obtained. Prior Anticoagulants: The patient has                            taken no previous anticoagulant or antiplatelet                            agents. ASA Grade Assessment: II - A patient with                            mild systemic disease. After reviewing the risks                            and benefits, the patient was deemed in                            satisfactory condition to undergo the procedure.  After obtaining informed consent, the colonoscope                            was passed under direct vision. Throughout the                            procedure, the patient's blood pressure, pulse, and                            oxygen saturations were monitored continuously. The                            Olympus [id number]) [id number] was introduced                            through the anus and advanced  to the the terminal                            ileum. The colonoscopy was performed without                            difficulty. The patient tolerated the procedure                            well. The quality of the bowel preparation was                            good. The terminal ileum, ileocecal valve,                            appendiceal orifice, and rectum were photographed. Scope In: 10:19:04 AM Scope Out: 10:38:41 AM Scope Withdrawal Time: 0 hours 16 minutes 9 seconds  Total Procedure Duration: 0 hours 19 minutes 37 seconds  Findings:                 The perianal and digital rectal examinations were                            normal.                           The colon (entire examined portion) appeared normal.                           A localized area of the ileocecal valve was mildly                            edematous. There was no mucosal erythema, erosions,                            or ulcers. Biopsies were taken with a cold forceps                            for histology. The colonoscope was advanced another  10 cm into the ileum, and the remainder was normal                            appearing. Estimated blood loss was minimal.                           The retroflexed view of the distal rectum and anal                            verge was normal and showed no anal or rectal                            abnormalities. Complications:            No immediate complications. Estimated Blood Loss:     Estimated blood loss was minimal. Impression:               - The entire examined colon is normal.                           - Congested mucosa at the ileocecal valve.                            Biopsied. Otherwise, normal terminal ileum.                           - The distal rectum and anal verge are normal on                            retroflexion view. Recommendation:           - Patient has a contact number available for                             emergencies. The signs and symptoms of potential                            delayed complications were discussed with the                            patient. Return to normal activities tomorrow.                            Written discharge instructions were provided to the                            patient.                           - Resume previous diet.                           - Continue present medications.                           - Await pathology results.                           -  Repeat colonoscopy in 10 years for screening                            purposes.                           - Return to GI office PRN. Gerrit Heck, MD 09/01/2020 10:53:43 AM

## 2020-09-01 NOTE — Op Note (Signed)
[facility] Patient Name: Francisco Lopez Procedure Date: 09/01/2020 10:02 AM MRN: [id number] Endoscopist: Gerrit Heck , MD Age: 60 Referring MD:  Date of Birth: 03/30/1961 Gender: Male Account #: [id number] Procedure:                Upper GI endoscopy Indications:              Dysphagia, Esophageal reflux Medicines:                Monitored Anesthesia Care Procedure:                Pre-Anesthesia Assessment:                           - Prior to the procedure, a History and Physical                            was performed, and patient medications and                            allergies were reviewed. The patient's tolerance of                            previous anesthesia was also reviewed. The risks                            and benefits of the procedure and the sedation                            options and risks were discussed with the patient.                            All questions were answered, and informed consent                            was obtained. Prior Anticoagulants: The patient has                            taken no previous anticoagulant or antiplatelet                            agents. ASA Grade Assessment: II - A patient with                            mild systemic disease. After reviewing the risks                            and benefits, the patient was deemed in                            satisfactory condition to undergo the procedure.                           After obtaining informed consent, the endoscope was  passed under direct vision. Throughout the                            procedure, the patient's blood pressure, pulse, and                            oxygen saturations were monitored continuously. The                            Endoscope was introduced through the mouth, and                            advanced to the second part of duodenum. The upper                            GI endoscopy was accomplished  without difficulty.                            The patient tolerated the procedure well. Scope In: Scope Out: Findings:                 LA Grade D (one or more mucosal breaks involving at                            least 75% of esophageal circumference) esophagitis                            with no bleeding was found in the lower third of                            the esophagus.                           The gastroesophageal flap valve was visualized                            endoscopically and classified as Hill Grade II                            (fold present, opens with respiration).                           A 1 cm sliding type hiatal hernia was present.                           A single area of ectopic gastric mucosa was found                            in the upper third of the esophagus.                           The remainder of the esophagus was normal  appearing. Given the symptoms of dysphagia, the                            decision was made to proceed with empiric                            esophageal dilation. The scope was withdrawn.                            Dilation was performed with a Maloney dilator with                            mild resistance at 63 Fr. The dilation site was                            examined following endoscope reinsertion and showed                            no bleeding, mucosal tear or perforation. Estimated                            blood loss: none.                           Moderate inflammation characterized by congestion                            (edema) and erythema was found in the gastric                            fundus, in the gastric body and in the gastric                            antrum. Biopsies were taken with a cold forceps for                            Helicobacter pylori testing. Estimated blood loss                            was minimal.                           The examined duodenum  was normal. Complications:            No immediate complications. Estimated Blood Loss:     Estimated blood loss was minimal. Impression:               - LA Grade D reflux esophagitis with no bleeding.                           - Gastroesophageal flap valve classified as Hill                            Grade II (fold present, opens with respiration).                           -  1 cm hiatal hernia.                           - Ectopic gastric mucosa in the upper third of the                            esophagus.                           - Normal middle third of esophagus. Dilated.                           - Gastritis. Biopsied.                           - Normal examined duodenum. Recommendation:           - Patient has a contact number available for                            emergencies. The signs and symptoms of potential                            delayed complications were discussed with the                            patient. Return to normal activities tomorrow.                            Written discharge instructions were provided to the                            patient.                           - Resume previous diet.                           - Continue present medications.                           - Await pathology results.                           - Repeat upper endoscopy in 8 weeks to check                            healing.                           - Use Protonix (pantoprazole) 40 mg PO BID for 8                            weeks, then reduce to 40 mg daily. Gerrit Heck, MD 09/01/2020 10:47:48 AM

## 2020-09-01 NOTE — Progress Notes (Signed)
VS taken by C.W. 

## 2020-09-01 NOTE — Patient Instructions (Signed)
YOU HAD AN ENDOSCOPIC PROCEDURE TODAY AT THE [facility]:   Refer to the procedure report that was given to you for any specific questions about what was found during the examination.  If the procedure report does not answer your questions, please call your gastroenterologist to clarify.  If you requested that your care partner not be given the details of your procedure findings, then the procedure report has been included in a sealed envelope for you to review at your convenience later.  YOU SHOULD EXPECT: Some feelings of bloating in the abdomen. Passage of more gas than usual.  Walking can help get rid of the air that was put into your GI tract during the procedure and reduce the bloating. If you had a lower endoscopy (such as a colonoscopy or flexible sigmoidoscopy) you may notice spotting of blood in your stool or on the toilet paper. If you underwent a bowel prep for your procedure, you may not have a normal bowel movement for a few days.  Please Note:  You might notice some irritation and congestion in your nose or some drainage.  This is from the oxygen used during your procedure.  There is no need for concern and it should clear up in a day or so.  SYMPTOMS TO REPORT IMMEDIATELY:   Following lower endoscopy (colonoscopy or flexible sigmoidoscopy):  Excessive amounts of blood in the stool  Significant tenderness or worsening of abdominal pains  Swelling of the abdomen that is new, acute  Fever of 100F or higher   Following upper endoscopy (EGD)  Vomiting of blood or coffee ground material  New chest pain or pain under the shoulder blades  Painful or persistently difficult swallowing  New shortness of breath  Fever of 100F or higher  Black, tarry-looking stools  For urgent or emergent issues, a gastroenterologist can be reached at any hour by calling [phone]. Do not use MyChart messaging for urgent concerns.    DIET:  We do recommend a small meal at first, but  then you may proceed to your regular diet.  Drink plenty of fluids but you should avoid alcoholic beverages for 24 hours.  ACTIVITY:  You should plan to take it easy for the rest of today and you should NOT DRIVE or use heavy machinery until tomorrow (because of the sedation medicines used during the test).    FOLLOW UP: Our staff will call the number listed on your records 48-72 hours following your procedure to check on you and address any questions or concerns that you may have regarding the information given to you following your procedure. If we do not reach you, we will leave a message.  We will attempt to reach you two times.  During this call, we will ask if you have developed any symptoms of COVID 19. If you develop any symptoms (ie: fever, flu-like symptoms, shortness of breath, cough etc.) before then, please call [phone].  If you test positive for Covid 19 in the 2 weeks post procedure, please call and report this information to us.    If any biopsies were taken you will be contacted by phone or by letter within the next 1-3 weeks.  Please call us at [phone] if you have not heard about the biopsies in 3 weeks.    SIGNATURES/CONFIDENTIALITY: You and/or your care partner have signed paperwork which will be entered into your electronic medical record.  These signatures attest to the fact that that the information above on   your After Visit Summary has been reviewed and is understood.  Full responsibility of the confidentiality of this discharge information lies with you and/or your care-partner. 

## 2020-09-01 NOTE — Progress Notes (Signed)
Pt's states no medical or surgical changes since previsit or office visit. 

## 2020-09-01 NOTE — Progress Notes (Signed)
Report to PACU, RN, vss, BBS= Clear.  

## 2020-09-03 ENCOUNTER — Telehealth: Payer: Self-pay | Admitting: *Deleted

## 2020-09-03 NOTE — Telephone Encounter (Signed)
  Follow up Call-  Call back number 09/01/2020  Post procedure Call Back phone  # [phone]  Permission to leave phone message Yes  Some recent data might be hidden    Mailbox full

## 2020-09-03 NOTE — Telephone Encounter (Signed)
  Follow up Call-  Call back number 09/01/2020  Post procedure Call Back phone  # [phone]  Permission to leave phone message Yes  Some recent data might be hidden    Voicemail full

## 2020-09-09 ENCOUNTER — Encounter: Payer: Self-pay | Admitting: Gastroenterology

## 2020-09-23 DIAGNOSIS — D1801 Hemangioma of skin and subcutaneous tissue: Secondary | ICD-10-CM | POA: Diagnosis not present

## 2020-09-23 DIAGNOSIS — L565 Disseminated superficial actinic porokeratosis (DSAP): Secondary | ICD-10-CM | POA: Diagnosis not present

## 2020-09-23 DIAGNOSIS — L570 Actinic keratosis: Secondary | ICD-10-CM | POA: Diagnosis not present

## 2020-09-23 DIAGNOSIS — Z85828 Personal history of other malignant neoplasm of skin: Secondary | ICD-10-CM | POA: Diagnosis not present

## 2020-09-26 ENCOUNTER — Other Ambulatory Visit: Payer: Self-pay | Admitting: Family

## 2020-10-14 ENCOUNTER — Encounter: Payer: Self-pay | Admitting: Gastroenterology

## 2020-10-21 ENCOUNTER — Other Ambulatory Visit: Payer: Self-pay | Admitting: Family

## 2020-10-25 ENCOUNTER — Ambulatory Visit: Payer: BC Managed Care – PPO | Admitting: Family

## 2020-10-25 ENCOUNTER — Encounter: Payer: Self-pay | Admitting: Family

## 2020-10-25 ENCOUNTER — Other Ambulatory Visit: Payer: Self-pay

## 2020-10-25 VITALS — BP 126/87 | HR 96 | Temp 98.6°F | Resp 16 | Ht 71.0 in | Wt 192.0 lb

## 2020-10-25 DIAGNOSIS — R1031 Right lower quadrant pain: Secondary | ICD-10-CM

## 2020-10-25 HISTORY — DX: Right lower quadrant pain: R10.31

## 2020-10-25 NOTE — Assessment & Plan Note (Signed)
Hx and exam concerning for [facility]. Will refer to surgeon for second opinion at pt request.

## 2020-10-25 NOTE — Patient Instructions (Signed)
You should be contacted about your referral to the general surgeon. Please go to the ER if you develop severe/worsening pain.

## 2020-10-25 NOTE — Progress Notes (Signed)
Subjective:   By signing my name below, I, [name], attest that this documentation has been prepared under the direction and in the presence of [name] NP. 10/25/2020     Patient ID: Francisco Lopez, male    DOB: [birth year], 60 y.o.   MRN: [id number]  No chief complaint on file.   HPI Patient is in today for a office visit. He complains of pain in the right lower abdomen that goes down into his testicles. The pain in his testicle worsens after sex. He has aching pain in his testicles. He denies having any low back pain, fever, diarrhea, or constipation at this time. He still has his appendix.   Past Medical History:  Diagnosis Date  . Anxiety   . Atypical chest pain 08/01/2019  . Basal cell carcinoma 2006  . BPH with obstruction/lower urinary tract symptoms 10/19/2014  . Carpal tunnel syndrome 02/07/2016  . Chronic rhinitis 07/24/2016  . Depression   . Diastolic dysfunction 9/67/8938   Per 2D echo 8/16  . ED (erectile dysfunction) of organic origin 09/16/2014  . Elevated coronary artery calcium score 02/06/2020  . Frozen shoulder 2018   left  . GERD (gastroesophageal reflux disease) 12/27/2015  . Herpes simplex type 1 antibody positive 01/28/2015  . Herpes simplex type 2 infection 01/28/2015  . History of chicken pox   . History of obstructive sleep apnea 10/29/2015  . HTN (hypertension) 08/05/2014  . Hyperlipidemia   . Hypertension   . Left shoulder pain 02/20/2017  . Mood disorder ([place]) 08/25/2016  . Preventative health care 09/16/2014    Past Surgical History:  Procedure Laterality Date  . COLONOSCOPY     around 2015 [facility] in [place]  . KNEE SURGERY Left 2000   meninscus repair  . SHOULDER ARTHROSCOPY Left 01/2018    Family History  Problem Relation Age of Onset  . Diabetes Mother        type II  . Anxiety disorder Mother   . Hypertension Father   . Alcohol abuse Father   . Colon cancer Neg Hx   . Esophageal cancer Neg Hx   . Stomach cancer Neg  Hx   . Rectal cancer Neg Hx     Social History   Socioeconomic History  . Marital status: Divorced    Spouse name: Not on file  . Number of children: Not on file  . Years of education: Not on file  . Highest education level: Not on file  Occupational History  . Not on file  Tobacco Use  . Smoking status: Never Smoker  . Smokeless tobacco: Never Used  Vaping Use  . Vaping Use: Never used  Substance and Sexual Activity  . Alcohol use: No  . Drug use: No  . Sexual activity: Yes  Other Topics Concern  . 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 amLODipine (NORVASC) 10 MG tablet Take 1 tablet (10 mg total) by mouth daily. 180 tablet  3  . aspirin EC 81 MG tablet Take 1 tablet (81 mg total) by mouth daily. Swallow whole. 90 tablet 3  . atorvastatin (LIPITOR) 80 MG tablet Take 1 tablet (80 mg total) by mouth daily. 90 tablet 3  . divalproex (DEPAKOTE) 250 MG DR tablet TAKE 2 TO 3 TABLETS BY MOUTH EVERY EVENING  12  . fenofibrate (TRICOR) 145 MG tablet TAKE 1 TABLET (145 MG TOTAL) BY MOUTH DAILY. 30 tablet 5  . meloxicam (MOBIC) 7.5 MG tablet TAKE 1 TABLET BY MOUTH ONCE DAILY AS NEEDED FOR PAIN 14 tablet 0  . Multiple Vitamins-Minerals (MENS MULTIVITAMIN PLUS PO) Take 1 tablet by mouth daily in the afternoon.    . Omega-3 Fatty Acids (FISH OIL) 1000 MG CAPS Take 2 capsules (2,000 mg total) by mouth 2 (two) times daily. 180 capsule 3  . omeprazole (PRILOSEC) 20 MG capsule Take 1 capsule (20 mg total) by mouth daily. 90  capsule 1  . pantoprazole (PROTONIX) 40 MG tablet Take 1 tablet (40 mg total) by mouth 2 (two) times daily. 120 tablet 1  . QUEtiapine (SEROQUEL) 400 MG tablet Take 400 mg by mouth at bedtime.    . tadalafil (CIALIS) 5 MG tablet Take 5 mg by mouth daily as needed.    . valACYclovir (VALTREX) 500 MG tablet Take 1 tablet (500 mg total) by mouth daily. 90 tablet 3  . valsartan (DIOVAN) 80 MG tablet Take 1 tablet by mouth once daily 90 tablet 0   No facility-administered medications prior to visit.    No Known Allergies  Review of Systems  Constitutional: Negative for fever.  Gastrointestinal: Negative for constipation and diarrhea.  Musculoskeletal: Negative for back pain (Low back pain).       Objective:    Physical Exam Constitutional:      Appearance: Normal appearance.  HENT:     Head: Normocephalic and atraumatic.  Eyes:     Extraocular Movements: Extraocular movements intact.     Pupils: Pupils are equal, round, and reactive to light.  Cardiovascular:     Rate and Rhythm: Normal rate and regular rhythm.     Pulses: Normal pulses.     Heart sounds: Normal heart sounds. No murmur heard. No friction rub. No gallop.   Pulmonary:     Effort: Pulmonary effort is normal. No respiratory distress.     Breath sounds: Normal breath sounds. No stridor. No wheezing, rhonchi or rales.  Abdominal:     Tenderness: There is no abdominal tenderness.  Genitourinary:    Testes:        Right: Swelling not present.        Left: Swelling not present.     Comments: Left inguinal exam is normal Right inguinal exam, I appreciate slight downward bulge toward my finger with cough Skin:    General: Skin is warm and dry.  Neurological:     Mental Status: He is alert and oriented to person, place, and time.  Psychiatric:        Behavior: Behavior normal.     There were no vitals taken for this visit. Wt Readings from Last 3 Encounters:  09/01/20 182 lb (82.6 kg)  08/16/20 182 lb (82.6  kg)  07/22/20 190 lb 2 oz (86.2 kg)    Diabetic Foot Exam - Simple   No data filed    Lab Results  Component Value Date   WBC 7.0 06/01/2020   HGB 15.7 06/01/2020   HCT 46.2 06/01/2020   PLT  162.0 06/01/2020   GLUCOSE 102 (H) 04/28/2020   CHOL 189 04/28/2020   TRIG 81 04/28/2020   HDL 52 04/28/2020   LDLDIRECT 97.0 12/27/2015   LDLCALC 122 (H) 04/28/2020   ALT 20 04/28/2020   AST 13 04/28/2020   NA 141 04/28/2020   K 4.4 04/28/2020   CL 106 04/28/2020   CREATININE 1.08 04/28/2020   BUN 15 04/28/2020   CO2 22 04/28/2020   TSH 1.410 02/17/2020   PSA 1.29 08/27/2017   HGBA1C 5.6 06/26/2017    Lab Results  Component Value Date   TSH 1.410 02/17/2020   Lab Results  Component Value Date   WBC 7.0 06/01/2020   HGB 15.7 06/01/2020   HCT 46.2 06/01/2020   MCV 89.6 06/01/2020   PLT 162.0 06/01/2020   Lab Results  Component Value Date   NA 141 04/28/2020   K 4.4 04/28/2020   CO2 22 04/28/2020   GLUCOSE 102 (H) 04/28/2020   BUN 15 04/28/2020   CREATININE 1.08 04/28/2020   BILITOT 0.4 04/28/2020   ALKPHOS 46 04/28/2020   AST 13 04/28/2020   ALT 20 04/28/2020   PROT 6.9 04/28/2020   ALBUMIN 4.6 04/28/2020   CALCIUM 9.7 04/28/2020   GFR 78.38 08/01/2019   Lab Results  Component Value Date   CHOL 189 04/28/2020   Lab Results  Component Value Date   HDL 52 04/28/2020   Lab Results  Component Value Date   LDLCALC 122 (H) 04/28/2020   Lab Results  Component Value Date   TRIG 81 04/28/2020   Lab Results  Component Value Date   CHOLHDL 3.6 04/28/2020   Lab Results  Component Value Date   HGBA1C 5.6 06/26/2017       Assessment & Plan:   Problem List Items Addressed This Visit   None      No orders of the defined types were placed in this encounter.   I, [name] NP, personally preformed the services described in this documentation.  All medical record entries made by the scribe were at my direction and in my presence.  I have  reviewed the chart and discharge instructions (if applicable) and agree that the record reflects my personal performance and is accurate and complete. 10/25/2020   I,[name],acting as a Education administrator for [name], NP.,have documented all relevant documentation on the behalf of [name], NP,as directed by  [name], NP while in the presence of [name], NP.   Shehryar [employer]

## 2020-11-18 ENCOUNTER — Ambulatory Visit: Payer: BC Managed Care – PPO | Attending: Internal Medicine

## 2020-11-18 DIAGNOSIS — Z20822 Contact with and (suspected) exposure to covid-19: Secondary | ICD-10-CM

## 2020-11-19 ENCOUNTER — Encounter: Payer: Self-pay | Admitting: Family

## 2020-11-19 ENCOUNTER — Telehealth: Payer: Self-pay | Admitting: Family

## 2020-11-19 LAB — SARS-COV-2, NAA 2 DAY TAT

## 2020-11-19 LAB — NOVEL CORONAVIRUS, NAA: SARS-CoV-2, NAA: DETECTED — AB

## 2020-11-19 NOTE — Progress Notes (Signed)
Covid

## 2020-11-19 NOTE — Telephone Encounter (Signed)
Tried to reach pt at number listed. No answer. Left message requesting that he send [place] a mychart message with date of symptom onset, date he tested positive for covid and how he is feeling.

## 2020-11-21 ENCOUNTER — Other Ambulatory Visit: Payer: Self-pay | Admitting: Family

## 2020-11-21 MED ORDER — MOLNUPIRAVIR 200 MG PO CAPS: 800.0000 mg | ORAL_CAPSULE | Freq: Two times a day (BID) | ORAL | 0 refills | Status: AC

## 2020-11-23 ENCOUNTER — Other Ambulatory Visit: Payer: Self-pay

## 2020-11-23 ENCOUNTER — Ambulatory Visit (AMBULATORY_SURGERY_CENTER): Payer: BC Managed Care – PPO | Admitting: *Deleted

## 2020-11-23 VITALS — Ht 71.0 in | Wt 198.0 lb

## 2020-11-23 DIAGNOSIS — K209 Esophagitis, unspecified without bleeding: Secondary | ICD-10-CM

## 2020-11-23 NOTE — Progress Notes (Signed)
Pt's previsit is done over the phone and all paperwork (prep instructions, blank consent form to just read over) sent to patient.  Pt's name and DOB verified at the beginning of the previsit.  Pt denies any difficulty with ambulating.     No trouble with anesthesia, denies being told they were difficult to intubate, or hx/fam hx of malignant hyperthermia per pt   No egg or soy allergy  No home oxygen use   No medications for weight loss taken Pt was diagnosed with covid on 11-17-20; will be greater than 2 weeks when he has procedure

## 2020-12-03 DIAGNOSIS — R1031 Right lower quadrant pain: Secondary | ICD-10-CM | POA: Diagnosis not present

## 2020-12-07 ENCOUNTER — Encounter: Payer: Self-pay | Admitting: Gastroenterology

## 2020-12-07 ENCOUNTER — Ambulatory Visit (AMBULATORY_SURGERY_CENTER): Payer: BC Managed Care – PPO | Admitting: Gastroenterology

## 2020-12-07 ENCOUNTER — Other Ambulatory Visit: Payer: Self-pay

## 2020-12-07 VITALS — BP 119/77 | HR 63 | Temp 97.7°F | Resp 21 | Ht 71.0 in | Wt 198.0 lb

## 2020-12-07 DIAGNOSIS — K449 Diaphragmatic hernia without obstruction or gangrene: Secondary | ICD-10-CM

## 2020-12-07 DIAGNOSIS — K2990 Gastroduodenitis, unspecified, without bleeding: Secondary | ICD-10-CM | POA: Diagnosis not present

## 2020-12-07 DIAGNOSIS — K2970 Gastritis, unspecified, without bleeding: Secondary | ICD-10-CM | POA: Diagnosis not present

## 2020-12-07 DIAGNOSIS — K219 Gastro-esophageal reflux disease without esophagitis: Secondary | ICD-10-CM

## 2020-12-07 DIAGNOSIS — K2100 Gastro-esophageal reflux disease with esophagitis, without bleeding: Secondary | ICD-10-CM

## 2020-12-07 MED ORDER — SODIUM CHLORIDE 0.9 % IV SOLN: 500.0000 mL | Freq: Once | INTRAVENOUS | Status: DC

## 2020-12-07 NOTE — Patient Instructions (Signed)
Please read handouts provided. Continue present medications. Reduce Protonix ( pantoprazole ) to 40 mg daily, and slowly titrate to lowest effective dose to control reflux symptoms. Return to GI clinic in 3-6 months or sooner as needed.    YOU HAD AN ENDOSCOPIC PROCEDURE TODAY AT [facility]:   Refer to the procedure report that was given to you for any specific questions about what was found during the examination.  If the procedure report does not answer your questions, please call your gastroenterologist to clarify.  If you requested that your care partner not be given the details of your procedure findings, then the procedure report has been included in a sealed envelope for you to review at your convenience later.  YOU SHOULD EXPECT: Some feelings of bloating in the abdomen. Passage of more gas than usual.  Walking can help get rid of the air that was put into your GI tract during the procedure and reduce the bloating. If you had a lower endoscopy (such as a colonoscopy or flexible sigmoidoscopy) you may notice spotting of blood in your stool or on the toilet paper. If you underwent a bowel prep for your procedure, you may not have a normal bowel movement for a few days.  Please Note:  You might notice some irritation and congestion in your nose or some drainage.  This is from the oxygen used during your procedure.  There is no need for concern and it should clear up in a day or so.  SYMPTOMS TO REPORT IMMEDIATELY:    Following upper endoscopy (EGD)  Vomiting of blood or coffee ground material  New chest pain or pain under the shoulder blades  Painful or persistently difficult swallowing  New shortness of breath  Fever of 100F or higher  Black, tarry-looking stools  For urgent or emergent issues, a gastroenterologist can be reached at any hour by calling [phone]. Do not use MyChart messaging for urgent concerns.    DIET:  We do recommend a small meal at  first, but then you may proceed to your regular diet.  Drink plenty of fluids but you should avoid alcoholic beverages for 24 hours.  ACTIVITY:  You should plan to take it easy for the rest of today and you should NOT DRIVE or use heavy machinery until tomorrow (because of the sedation medicines used during the test).    FOLLOW UP: Our staff will call the number listed on your records 48-72 hours following your procedure to check on you and address any questions or concerns that you may have regarding the information given to you following your procedure. If we do not reach you, we will leave a message.  We will attempt to reach you two times.  During this call, we will ask if you have developed any symptoms of COVID 19. If you develop any symptoms (ie: fever, flu-like symptoms, shortness of breath, cough etc.) before then, please call [phone].  If you test positive for Covid 19 in the 2 weeks post procedure, please call and report this information to [place].    If any biopsies were taken you will be contacted by phone or by letter within the next 1-3 weeks.  Please call us at [phone] if you have not heard about the biopsies in 3 weeks.    SIGNATURES/CONFIDENTIALITY: You and/or your care partner have signed paperwork which will be entered into your electronic medical record.  These signatures attest to the fact that that the information  above on your After Visit Summary has been reviewed and is understood.  Full responsibility of the confidentiality of this discharge information lies with you and/or your care-partner.  

## 2020-12-07 NOTE — Progress Notes (Signed)
pt tolerated well. VSS. awake and to recovery. Report given to RN. Bite block left insitu to recovery. 

## 2020-12-07 NOTE — Op Note (Signed)
[place] Patient Name: Francisco Lopez Procedure Date: 12/07/2020 11:00 AM MRN: [id number] Endoscopist: Gerrit Heck , MD Age: 60 Referring MD:  Date of Birth: 10-Aug-1960 Gender: Male Account #: [id number] Procedure:                Upper GI endoscopy Indications:              Follow-up of reflux esophagitis, Follow-up of                            gastritis                           60 yo male with history of GERD with EGD on                            09/01/2020 notable for LA Grade D esophagitis and                            moderate non-H pylori gastritis. He has since                            started Protonix 40 mg BID with resolution of                            reflux symptoms. Also with dysphagia at that time,                            treated with empiric 54 Fr Maloney esophageal                            dilation and high dose PPI, with resolution of                            dysphagia. He presents today to evaluate for                            mucosal healing. Medicines:                Monitored Anesthesia Care Procedure:                Pre-Anesthesia Assessment:                           - Prior to the procedure, a History and Physical                            was performed, and patient medications and                            allergies were reviewed. The patient's tolerance of                            previous anesthesia was also reviewed. The risks  and benefits of the procedure and the sedation                            options and risks were discussed with the patient.                            All questions were answered, and informed consent                            was obtained. Prior Anticoagulants: The patient has                            taken no previous anticoagulant or antiplatelet                            agents. ASA Grade Assessment: II - A patient with                            mild systemic  disease. After reviewing the risks                            and benefits, the patient was deemed in                            satisfactory condition to undergo the procedure.                           After obtaining informed consent, the endoscope was                            passed under direct vision. Throughout the                            procedure, the patient's blood pressure, pulse, and                            oxygen saturations were monitored continuously. The                            GIF [id number] #[id number] was introduced through the                            mouth, and advanced to the second part of duodenum.                            The upper GI endoscopy was accomplished without                            difficulty. The patient tolerated the procedure                            well. Scope In: Scope Out: Findings:  The examined esophagus was normal. Hill grade 2                            valve and small 1 cm sliding type hiatal hernia was                            noted on this study.                           The Z-line was regular and was found 40 cm from the                            incisors. The previously noted LA Grade D                            esophagitis has since healed.                           Scattered minimal inflammation characterized by                            congestion (edema) and erythema was found in the                            gastric fundus, in the gastric body and in the                            gastric antrum. This was much improved compared                            with the previous study. This was previously                            biopsied and therefore given overall improvement,                            repeat biopsies not performed today.                           The examined duodenum was normal. Complications:            No immediate complications. Estimated Blood Loss:     Estimated blood loss:  none. Impression:               - Normal esophagus.                           - Z-line regular, 40 cm from the incisors.                           - Gastritis.                           - Normal examined duodenum.                           -  No specimens collected. Recommendation:           - Patient has a contact number available for                            emergencies. The signs and symptoms of potential                            delayed complications were discussed with the                            patient. Return to normal activities tomorrow.                            Written discharge instructions were provided to the                            patient.                           - Resume previous diet.                           - Reduce Protonix (pantoprazole) to 40 mg PO daily,                            and can slowly titrate to lowest effective dose to                            control reflux symptoms.                           - Return to GI clinic in 3-6 months or sooner as                            needed. Gerrit Heck, MD 12/07/2020 11:17:52 AM

## 2020-12-07 NOTE — Progress Notes (Signed)
Pt's states no medical or surgical changes since previsit or office visit. 

## 2020-12-09 ENCOUNTER — Telehealth: Payer: Self-pay | Admitting: *Deleted

## 2020-12-09 NOTE — Telephone Encounter (Signed)
  Follow up Call-  Call back number 12/07/2020 09/01/2020  Post procedure Call Back phone  # [phone]  Permission to leave phone message Yes Yes  Some recent data might be hidden     Patient questions:  Message left to call us if necessary.

## 2020-12-09 NOTE — Telephone Encounter (Signed)
  Follow up Call-  Call back number 12/07/2020 09/01/2020  Post procedure Call Back phone  # [phone]  Permission to leave phone message Yes Yes  Some recent data might be hidden     Patient questions:  Do you have a fever, pain , or abdominal swelling? No. Pain Score  0 *  Have you tolerated food without any problems? Yes.    Have you been able to return to your normal activities? Yes.    Do you have any questions about your discharge instructions: Diet   No. Medications  No. Follow up visit  No.  Do you have questions or concerns about your Care? No.  Actions: * If pain score is 4 or above: No action needed, pain <4.  Have you developed a fever since your procedure? no  2.   Have you had an respiratory symptoms (SOB or cough) since your procedure? no  3.   Have you tested positive for COVID 19 since your procedure no  4.   Have you had any family members/close contacts diagnosed with the COVID 19 since your procedure?  no   If yes to any of these questions please route to Joylene John, RN and Joella Prince, RN

## 2021-01-24 ENCOUNTER — Other Ambulatory Visit: Payer: Self-pay | Admitting: Cardiology

## 2021-01-24 DIAGNOSIS — Z79899 Other long term (current) drug therapy: Secondary | ICD-10-CM

## 2021-01-24 DIAGNOSIS — E782 Mixed hyperlipidemia: Secondary | ICD-10-CM

## 2021-01-25 ENCOUNTER — Other Ambulatory Visit: Payer: Self-pay

## 2021-01-25 MED ORDER — ATORVASTATIN CALCIUM 80 MG PO TABS: 80.0000 mg | ORAL_TABLET | Freq: Every day | ORAL | 3 refills | Status: DC

## 2021-01-25 MED ORDER — VALSARTAN 80 MG PO TABS: 80.0000 mg | ORAL_TABLET | Freq: Every day | ORAL | 0 refills | Status: DC

## 2021-01-27 ENCOUNTER — Encounter: Payer: Self-pay | Admitting: Physical Therapy

## 2021-01-27 ENCOUNTER — Ambulatory Visit: Payer: BC Managed Care – PPO | Attending: Surgery | Admitting: Physical Therapy

## 2021-01-27 ENCOUNTER — Other Ambulatory Visit: Payer: Self-pay

## 2021-01-27 DIAGNOSIS — M62838 Other muscle spasm: Secondary | ICD-10-CM | POA: Diagnosis not present

## 2021-01-27 DIAGNOSIS — M6281 Muscle weakness (generalized): Secondary | ICD-10-CM | POA: Diagnosis not present

## 2021-01-27 NOTE — Patient Instructions (Signed)
Access Code: [email or web address] URL: [email or web address] Date: 01/27/2021 Prepared by: Jari Favre  Exercises Sidelying Hip Abduction with Ankle Weight - 1 x daily - 7 x weekly - 3 sets - 10 reps Supine Hamstring Stretch - 1 x daily - 7 x weekly - 1 sets - 3 reps - 30 sec hold Supine Hip Internal and External Rotation - 1 x daily - 7 x weekly - 1 sets - 10 reps - 5 sec hold Doorway Hip Flexor Stretch with Chair - 1 x daily - 7 x weekly - 1 sets - 3 reps - 30 hold Standing Hip Abduction - 1 x daily - 7 x weekly - 3 sets - 10 reps Standing Hamstring Stretch with Step - 1 x daily - 7 x weekly - 1 sets - 3 reps - 30 sec hold  Patient Education Trigger Point Dry Needling

## 2021-01-27 NOTE — Therapy (Signed)
[facility] [address] [place], [place], [address] Phone: [phone]   Fax:  [phone]  Physical Therapy Evaluation  Patient Details  Name: Francisco Lopez MRN: [id number] Date of Birth: 03-22-1961 Referring Provider (PT): Clovis Riley, MD   Encounter Date: 01/27/2021   PT End of Session - 01/27/21 1135     Visit Number 1    Date for PT Re-Evaluation 04/21/21    PT Start Time [id number]    PT Stop Time [id number]    PT Time Calculation (min) 55 min    Activity Tolerance Patient tolerated treatment well    Behavior During Therapy [facility] for tasks assessed/performed             Past Medical History:  Diagnosis Date   Anxiety    Atypical chest pain 08/01/2019   Basal cell carcinoma 2006   BPH with obstruction/lower urinary tract symptoms 10/19/2014   Carpal tunnel syndrome 02/07/2016   Chronic rhinitis 07/24/2016   Depression    Diastolic dysfunction [id number]   Per 2D echo 8/16   ED (erectile dysfunction) of organic origin 09/16/2014   Elevated coronary artery calcium score 02/06/2020   Frozen shoulder 2018   left   GERD (gastroesophageal reflux disease) 12/27/2015   Herpes simplex type 1 antibody positive 01/28/2015   Herpes simplex type 2 infection 01/28/2015   History of chicken pox    History of obstructive sleep apnea 10/29/2015   HTN (hypertension) 08/05/2014   Hyperlipidemia    Hypertension    Left shoulder pain 02/20/2017   Mood disorder ([place]) 08/25/2016   Preventative health care 09/16/2014    Past Surgical History:  Procedure Laterality Date   COLONOSCOPY     around 2015 [facility] in [place] Left 2000   meninscus repair   SHOULDER ARTHROSCOPY Left 01/2018   UPPER GASTROINTESTINAL ENDOSCOPY      There were no vitals filed for this visit.    Subjective Assessment - 01/27/21 1137     Subjective Pain has been going on for 1.5 years.  It happens when sitting in the truck or on the  couch a certain way.  Pt also hikes.  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  He does this every time he loads and unload. When having pain it is 9/10 and lingers for 30-40 minutes afterwards.    Patient Stated Goals improve function, work without pain    Currently in Pain? 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      Cognition   Overall Cognitive Status Within Functional Limits for tasks assessed  Functional Tests   Functional tests Squat;Single leg stance      Squat   Comments more forward on Rt      Single Leg Stance   Comments Rt trendelenburg      Posture/Postural Control   Postural Limitations Rounded Shoulders;Forward head;Anterior pelvic tilt;Increased lumbar lordosis      ROM / Strength   AROM / PROM / Strength Strength;PROM;AROM      AROM   Overall AROM Comments lumbar 75%      PROM   Overall PROM Comments Rt hip IR 75%; Lt ER 75%      Strength   Overall Strength Comments Rt hip abduction 4/5; Rt hip adduction 4+/5      Flexibility   Soft Tissue Assessment /Muscle Length yes    Hamstrings Rt 65%; Lt 80%      Palpation   Palpation comment Rt obdurator tight, h/s TTP and tight, lumbar tight bil Rt >Lt                        Objective measurements completed on examination: See above findings.        [facility] PT Treatment/Exercise - 01/27/21 0001       Self-Care   Self-Care Other Self-Care Comments    Other Self-Care Comments  inital HEP performed and reviewed      Manual Therapy   Soft tissue mobilization lumbar paraspinals              Trigger Point Dry Needling - 01/27/21 0001     Consent Given? Yes    Education Handout Provided Yes    Muscles Treated Back/Hip Lumbar multifidi    Lumbar multifidi Response Twitch response elicited;Palpable increased muscle length                  PT Education - 01/27/21 1257     Education provided Yes    Education Details Access Code: [email or web address]    Person(s) Educated Patient    Methods Explanation;Demonstration;Tactile cues;Verbal cues;Handout    Comprehension Verbalized understanding;Returned demonstration              PT Short Term Goals - 01/27/21 1354       PT SHORT TERM GOAL #1   Title ind with initial HEP    Time 4    Period Weeks    Status New    Target Date 02/24/21      PT SHORT TERM GOAL #2   Title Pt will report 25% less pain    Time 4    Period Weeks    Status New    Target Date 02/24/21               PT Long Term Goals - 01/27/21 1355       PT LONG TERM GOAL #1   Title patient to be independent with advanced HEP    Time 12    Period Weeks    Status New    Target Date 04/21/21      PT LONG TERM GOAL #2   Title Pt will report only 1 day at most during the week that he is feeling groin pain    Time 12    Period Weeks    Status New    Target Date 04/21/21      PT LONG TERM GOAL #3   Title Pt will demonstrate 5/5 Rt hip abduction and adduction without clicking or popping for greater stability  during functional tasks    Time 12    Period Weeks    Status New    Target Date 04/21/21      PT LONG TERM GOAL #4   Title Pt will report at least 75% less groin pain    Time 12    Period Weeks    Status New    Target Date 04/21/21      PT LONG TERM GOAL #5   Title patient to  report ability to perform job duties wihtout increased pain    Time 12    Period Weeks    Status New    Target Date 04/21/21                    Plan - 01/27/21 1258     Clinical Impression Statement Pt presents to skilled PT due to groin pain that has been ongoing for the last year and a half.  Pt drives a tanker for a living and experiences pain more often when sitting in truck or on a certain couch at home.  Pt has finding significant for Rt hip weakness with some popping due to instabliity. Pt has tight ER Lt hip and tight hamstrings bil Rt>Lt.  Pt has anterior hip pain at end range hip extension.  Pt has tight lumbar paraspinals and decreased lumbar flexion.  Posture abnormalities as mentioned above.  Pt will benefit from skilled PT to address impairments and restore to maximum function without pain.    Examination-Activity Limitations Sit    Examination-Participation Restrictions Occupation;Driving    Stability/Clinical Decision Making Stable/Uncomplicated    Clinical Decision Making Low    Rehab Potential Excellent    PT Frequency 1x / week    PT Duration 12 weeks    PT Treatment/Interventions ADLs/Self Care Home Management;Cryotherapy;Electrical Stimulation;Iontophoresis '4mg'$ /ml Dexamethasone;Moist Heat;Ultrasound;Neuromuscular re-education;Therapeutic exercise;Therapeutic activities;Patient/family education;Manual techniques;Passive range of motion;Vasopneumatic Device;Taping;Dry needling;Biofeedback    PT Next Visit Plan f/u on DN and HEP; gluteal and core strength and check pelvic obliquity, STM to hip flexors, sitting posture education    PT Home Exercise Plan Access Code: [email or web address]    Consulted and Agree with Plan of Care Patient             Patient will benefit from skilled therapeutic intervention in order to improve the following deficits and impairments:  Decreased activity tolerance, Decreased range of motion, Decreased strength, Pain, Postural dysfunction,  Increased muscle spasms  Visit Diagnosis: Muscle weakness (generalized)  Other muscle spasm     Problem List Patient Active Problem List   Diagnosis Date Noted   Right inguinal pain 10/25/2020   Anxiety    History of chicken pox    Hypertension    Elevated coronary artery calcium score 02/06/2020   Atypical chest pain 08/01/2019   Left shoulder pain 02/20/2017   Mood disorder ([place]) 08/25/2016   Chronic rhinitis 07/24/2016   Frozen shoulder 2018   Carpal tunnel syndrome 02/07/2016   GERD (gastroesophageal reflux disease) 12/27/2015   History of obstructive sleep apnea [id number]   Diastolic dysfunction [id number]   Herpes simplex type 1 antibody positive 01/28/2015   Herpes simplex type 2 infection 01/28/2015   BPH with obstruction/lower urinary tract symptoms 10/19/2014   Preventative health care 09/16/2014   ED (erectile dysfunction) of organic origin 09/16/2014   Depression 08/05/2014   HTN (hypertension) 08/05/2014   Hyperlipidemia 08/05/2014   Basal cell carcinoma 2006    Jakki L Brean Carberry, PT 01/27/2021, 3:32 PM  [facility] [address] [place], [place], [address] Phone: [phone]   Fax:  [phone]  Name: Francisco Lopez MRN: [id number] Date of Birth: 07-Aug-1960

## 2021-01-31 ENCOUNTER — Encounter: Payer: BC Managed Care – PPO | Admitting: Physical Therapy

## 2021-02-11 ENCOUNTER — Encounter: Payer: BC Managed Care – PPO | Admitting: Physical Therapy

## 2021-02-18 ENCOUNTER — Encounter: Payer: BC Managed Care – PPO | Admitting: Physical Therapy

## 2021-02-22 ENCOUNTER — Encounter: Payer: BC Managed Care – PPO | Admitting: Physical Therapy

## 2021-03-03 ENCOUNTER — Encounter: Payer: BC Managed Care – PPO | Admitting: Physical Therapy

## 2021-03-10 ENCOUNTER — Encounter: Payer: BC Managed Care – PPO | Admitting: Physical Therapy

## 2021-03-17 ENCOUNTER — Encounter: Payer: Self-pay | Admitting: Family

## 2021-03-21 ENCOUNTER — Other Ambulatory Visit: Payer: Self-pay

## 2021-03-21 ENCOUNTER — Encounter: Payer: Self-pay | Admitting: Family

## 2021-03-21 ENCOUNTER — Ambulatory Visit: Payer: BC Managed Care – PPO | Admitting: Family

## 2021-03-21 VITALS — BP 137/83 | HR 68 | Temp 98.2°F | Resp 16 | Ht 71.0 in | Wt 188.0 lb

## 2021-03-21 DIAGNOSIS — F32A Depression, unspecified: Secondary | ICD-10-CM

## 2021-03-21 DIAGNOSIS — E785 Hyperlipidemia, unspecified: Secondary | ICD-10-CM | POA: Diagnosis not present

## 2021-03-21 DIAGNOSIS — K219 Gastro-esophageal reflux disease without esophagitis: Secondary | ICD-10-CM

## 2021-03-21 DIAGNOSIS — N401 Enlarged prostate with lower urinary tract symptoms: Secondary | ICD-10-CM

## 2021-03-21 DIAGNOSIS — N138 Other obstructive and reflux uropathy: Secondary | ICD-10-CM

## 2021-03-21 DIAGNOSIS — Z23 Encounter for immunization: Secondary | ICD-10-CM

## 2021-03-21 DIAGNOSIS — B009 Herpesviral infection, unspecified: Secondary | ICD-10-CM | POA: Diagnosis not present

## 2021-03-21 DIAGNOSIS — F419 Anxiety disorder, unspecified: Secondary | ICD-10-CM

## 2021-03-21 DIAGNOSIS — I10 Essential (primary) hypertension: Secondary | ICD-10-CM | POA: Diagnosis not present

## 2021-03-21 LAB — LIPID PANEL
Cholesterol: 150 mg/dL (ref 0–200)
HDL: 43.1 mg/dL (ref >39.00)
LDL Cholesterol: 84 mg/dL (ref 0–99)
NonHDL: 106.76
Total CHOL/HDL Ratio: 3
Triglycerides: 115 mg/dL (ref 0.0–149.0)
VLDL: 23 mg/dL (ref 0.0–40.0)

## 2021-03-21 LAB — COMPREHENSIVE METABOLIC PANEL
ALT: 20 U/L (ref 0–53)
AST: 13 U/L (ref 0–37)
Albumin: 4.7 g/dL (ref 3.5–5.2)
Alkaline Phosphatase: 36 U/L — ABNORMAL LOW (ref 39–117)
BUN: 16 mg/dL (ref 6–23)
CO2: 28 mEq/L (ref 19–32)
Calcium: 9.8 mg/dL (ref 8.4–10.5)
Chloride: 106 mEq/L (ref 96–112)
Creatinine, Ser: 0.9 mg/dL (ref 0.40–1.50)
GFR: 92.98 mL/min (ref >60.00)
Glucose, Bld: 94 mg/dL (ref 70–99)
Potassium: 4.6 mEq/L (ref 3.5–5.1)
Sodium: 141 mEq/L (ref 135–145)
Total Bilirubin: 0.5 mg/dL (ref 0.2–1.2)
Total Protein: 7.1 g/dL (ref 6.0–8.3)

## 2021-03-21 MED ORDER — HYDROXYZINE HCL 25 MG PO TABS: 25.0000 mg | ORAL_TABLET | Freq: Three times a day (TID) | ORAL | 0 refills | Status: DC | PRN

## 2021-03-21 NOTE — Assessment & Plan Note (Signed)
BP Readings from Last 3 Encounters:  03/21/21 137/83  12/07/20 119/77  10/25/20 126/87   Stable on valsartan 80mg  and valsartan 10.

## 2021-03-21 NOTE — Assessment & Plan Note (Signed)
Reports symptoms are stable with cialis.

## 2021-03-21 NOTE — Patient Instructions (Addendum)
Please complete lab work prior to leaving. [place] (counseling):  [phone].

## 2021-03-21 NOTE — Assessment & Plan Note (Signed)
Lab Results  Component Value Date   CHOL 189 04/28/2020   HDL 52 04/28/2020   LDLCALC 122 (H) 04/28/2020   LDLDIRECT 97.0 12/27/2015   TRIG 81 04/28/2020   CHOLHDL 3.6 04/28/2020   Continues atorvastatin 80mg .

## 2021-03-21 NOTE — Assessment & Plan Note (Signed)
Uncontrolled. Will rx with prn atarax as needed for anxiety and insomnia. Defer further management to psychiatry.

## 2021-03-21 NOTE — Assessment & Plan Note (Signed)
Stable on daily valtrex prophylaxis 500mg . Continue same.

## 2021-03-21 NOTE — Progress Notes (Signed)
Subjective:   By signing my name below, I, Francisco Lopez, attest that this documentation has been prepared under the direction and in the presence of Francisco Lopez. 03/21/2021    Patient ID: Francisco Lopez, male    DOB: Aug 18, 1960, 60 y.o.   MRN: [id number]  Chief Complaint  Patient presents with   Anxiety    Patient complains of increased anxiety   Insomnia    Patient reports having trouble staying sleep     HPI Patient is in today for a office visit.  Anxiety- He complains of anxiety and depression. He is struggling to sleep at night. When laying down his heart starts racing and calms down after a while before he can sleep. He reports 2 of his sisters and brother died in the past year. He has also recently separated from his partner. He continues taking 400 mg Seroquel daily PO and reports no new issues while taking it. He is interested in seeing a counselor for his depression and anxiety. He has seen a counselor prior but notes that she retired.  Blood pressure- He continues taking 80 mg valsartan daily PO and reports no new issues while taking it.  BP Readings from Last 3 Encounters:  03/21/21 137/83  12/07/20 119/77  10/25/20 126/87   Pulse Readings from Last 3 Encounters:  03/21/21 68  12/07/20 63  10/25/20 96   Cholesterol- He continues taking 80 mg atorvastatin daily PO, 145 mg fenofibrate daily PO and reports no new issues while taking them.   Lab Results  Component Value Date   CHOL 189 04/28/2020   HDL 52 04/28/2020   LDLCALC 122 (H) 04/28/2020   LDLDIRECT 97.0 12/27/2015   TRIG 81 04/28/2020   CHOLHDL 3.6 04/28/2020   Family history- She reports that his brother and sister passed away form Cancer. Her brother and sister that passed away has hemochromatosis but he tested negative for it. He also reports that his younger sister had passed away from [id number] complications. He reports that his older brother and him are the only surviving family of his  siblings.  Immunizations- He reports getting his second shingles vaccine 2 months ago at his pharmacy. He is receiving a flu vaccine during this visit. He is not interested in getting the Covid-19 vaccines.  Herpes- He continues taking 500 mg valtrex daily PO and reports no new issues while taking it. He denies having any recent break outs.  Reflux- He continues taking 20 mg Prilosec daily PO and reports no new issues while taking it. He also continues taking 40 mg omeprazole daily PO and reports no new issues while taking it.  Cialis- He continues taking 5 mg Cialis daily PO and reports no new issues while taking it.  Physical therapy- He reports while in physical therapy he found dry needling effecting in managing his pain. He notes that he cannot fit his schedule with his current physical therapist and has not recently went to an appointment.    Health Maintenance Due  Topic Date Due   Zoster Vaccines- Shingrix (2 of 2) 09/26/2019   INFLUENZA VACCINE  01/10/2021    Past Medical History:  Diagnosis Date   Anxiety    Atypical chest pain 08/01/2019   Basal cell carcinoma 2006   BPH with obstruction/lower urinary tract symptoms 10/19/2014   Carpal tunnel syndrome 02/07/2016   Chronic rhinitis 07/24/2016   Depression    Diastolic dysfunction 6/71/2458   Per 2D echo 8/16   ED (erectile dysfunction)  of organic origin 09/16/2014   Elevated coronary artery calcium score 02/06/2020   Frozen shoulder 2018   left   GERD (gastroesophageal reflux disease) 12/27/2015   Herpes simplex type 1 antibody positive 01/28/2015   Herpes simplex type 2 infection 01/28/2015   History of chicken pox    History of obstructive sleep apnea 10/29/2015   HTN (hypertension) 08/05/2014   Hyperlipidemia    Hypertension    Left shoulder pain 02/20/2017   Mood disorder ([place]) 08/25/2016   Preventative health care 09/16/2014    Past Surgical History:  Procedure Laterality Date   COLONOSCOPY     around 2015 [facility] in [place] Left 2000   meninscus repair   SHOULDER ARTHROSCOPY Left 01/2018   UPPER GASTROINTESTINAL ENDOSCOPY      Family History  Problem Relation Age of Onset   Diabetes Mother        type II   Anxiety disorder Mother    Hypertension Father    Alcohol abuse Father    Liver cancer Sister    Hemachromatosis Sister    COPD Sister        died from covid complication   Diabetes Mellitus II Sister    Hemachromatosis Brother    Liver cancer Brother    CAD Brother        smoker   CAD Brother        smoker   Colon cancer Neg Hx    Esophageal cancer Neg Hx    Stomach cancer Neg Hx    Rectal cancer Neg Hx     Social History   Socioeconomic History   Marital status: Divorced    Spouse name: Not on file   Number of children: Not on file   Years of education: Not on file   Highest education level: Not on file  Occupational History   Not on file  Tobacco Use   Smoking status: Never   Smokeless tobacco: Never  Vaping Use   Vaping Use: Never used  Substance and Sexual Activity   Alcohol use: No   Drug use: No   Sexual activity: Yes  Other Topics Concern   Not on file  Social History Narrative   Separated   1 child (56 yr old daughter)- lives in [place] 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 Swallow whole. 90 tablet 3   atorvastatin (LIPITOR) 80 MG tablet Take 1 tablet (80 mg total) by mouth daily. 90 tablet 3   divalproex (DEPAKOTE)  250 MG DR tablet TAKE 2 TO 3 TABLETS BY MOUTH EVERY EVENING  12   fenofibrate (TRICOR) 145 MG tablet TAKE 1 TABLET (145 MG TOTAL) BY MOUTH DAILY. 30 tablet 5   meloxicam (MOBIC) 7.5 MG tablet TAKE 1 TABLET BY MOUTH ONCE DAILY AS NEEDED FOR PAIN 14 tablet 0   Multiple Vitamins-Minerals (MENS MULTIVITAMIN PLUS PO) Take  1 tablet by mouth daily in the afternoon.     Omega-3 Fatty Acids (FISH OIL) 1000 MG CAPS Take 2 capsules (2,000 mg total) by mouth 2 (two) times daily. 180 capsule 3   pantoprazole (PROTONIX) 40 MG tablet Take 1 tablet (40 mg total) by mouth 2 (two) times daily. 120 tablet 1   QUEtiapine (SEROQUEL) 400 MG tablet Take 400 mg by mouth at bedtime.     tadalafil (CIALIS) 5 MG tablet Take 5 mg by mouth daily as needed.     valACYclovir (VALTREX) 500 MG tablet Take 1 tablet (500 mg total) by mouth daily. 90 tablet 3   valsartan (DIOVAN) 80 MG tablet Take 1 tablet (80 mg total) by mouth daily. 90 tablet 0   omeprazole (PRILOSEC) 20 MG capsule Take 1 capsule (20 mg total) by mouth daily. 90 capsule 1   No facility-administered medications prior to visit.    No Known Allergies  Review of Systems  Psychiatric/Behavioral:  Positive for depression. The patient is nervous/anxious.       Objective:    Physical Exam Constitutional:      General: He is not in acute distress.    Appearance: Normal appearance. He is not ill-appearing.  HENT:     Head: Normocephalic and atraumatic.     Right Ear: External ear normal.     Left Ear: External ear normal.  Eyes:     Extraocular Movements: Extraocular movements intact.     Pupils: Pupils are equal, round, and reactive to light.  Cardiovascular:     Rate and Rhythm: Normal rate and regular rhythm.     Heart sounds: Normal heart sounds. No murmur heard.   No gallop.  Pulmonary:     Effort: Pulmonary effort is normal. No respiratory distress.     Breath sounds: Normal breath sounds. No wheezing or rales.  Skin:    General: Skin is warm  and dry.  Neurological:     Mental Status: He is alert and oriented to person, place, and time.  Psychiatric:        Behavior: Behavior normal.        Judgment: Judgment normal.    BP 137/83 (BP Location: Right Arm, Patient Position: Sitting, Cuff Size: Small)   Pulse 68   Temp 98.2 F (36.8 C) (Oral)   Resp 16   Ht 5' 11"  (1.803 m)   Wt 188 lb (85.3 kg)   SpO2 98%   BMI 26.22 kg/m  Wt Readings from Last 3 Encounters:  03/21/21 188 lb (85.3 kg)  12/07/20 198 lb (89.8 kg)  11/23/20 198 lb (89.8 kg)       Assessment & Plan:   Problem List Items Addressed This Visit       Unprioritized   Hyperlipidemia    Lab Results  Component Value Date   CHOL 189 04/28/2020   HDL 52 04/28/2020   LDLCALC 122 (H) 04/28/2020   LDLDIRECT 97.0 12/27/2015   TRIG 81 04/28/2020   CHOLHDL 3.6 04/28/2020  Continues atorvastatin 19m.       Relevant Orders   Lipid panel   HTN (hypertension)    BP Readings from Last 3 Encounters:  03/21/21 137/83  12/07/20 119/77  10/25/20 126/87  Stable on valsartan 816mand valsartan 10.       Relevant Orders   Comp Met (CMET)   Herpes simplex type 2 infection    Stable on daily valtrex prophylaxis 50021mContinue same.  GERD (gastroesophageal reflux disease)    Reports stable on pantoprazole 73m daily.  Continue same.       Depression    He has had a lot of situational stressors (multiple deaths in the family, recently broke up with girlfriend, and had an accident driving gas truck for work). He has an upcoming appointment with his psychiatrist. I advised him to keep this appointment. I also recommended that he consider getting established with a counselor.       Relevant Medications   hydrOXYzine (ATARAX/VISTARIL) 25 MG tablet   BPH with obstruction/lower urinary tract symptoms    Reports symptoms are stable with cialis.       Anxiety    Uncontrolled. Will rx with prn atarax as needed for anxiety and insomnia. Defer further  management to psychiatry.       Relevant Medications   hydrOXYzine (ATARAX/VISTARIL) 25 MG tablet   Other Visit Diagnoses     Needs flu shot    -  Primary   Relevant Orders   Flu Vaccine QUAD 6+ mos PF IM (Fluarix Quad PF)        Meds ordered this encounter  Medications   hydrOXYzine (ATARAX/VISTARIL) 25 MG tablet    Sig: Take 1 tablet (25 mg total) by mouth every 8 (eight) hours as needed for anxiety.    Dispense:  30 tablet    Refill:  0    Order Specific Question:   Supervising Provider    Answer:   BPenni HomansA [[id number]]    I, MDebbrah AlarNP, personally preformed the services described in this documentation.  All medical record entries made by the scribe were at my direction and in my presence.  I have reviewed the chart and discharge instructions (if applicable) and agree that the record reflects my personal performance and is accurate and complete. 03/21/2021   I,Francisco Lopez,acting as a sEducation administratorfor MNance Pear Lopez.,have documented all relevant documentation on the behalf of MNance Pear Lopez,as directed by  MNance Pear Lopez while in the presence of MNance Pear Lopez.   MNance Pear Lopez

## 2021-03-21 NOTE — Assessment & Plan Note (Signed)
He has had a lot of situational stressors (multiple deaths in the family, recently broke up with girlfriend, and had an accident driving gas truck for work). He has an upcoming appointment with his psychiatrist. I advised him to keep this appointment. I also recommended that he consider getting established with a counselor.

## 2021-03-21 NOTE — Assessment & Plan Note (Addendum)
Reports stable on pantoprazole 40mg  daily.  Continue same.

## 2021-03-26 ENCOUNTER — Encounter: Payer: Self-pay | Admitting: Family

## 2021-03-26 DIAGNOSIS — N529 Male erectile dysfunction, unspecified: Secondary | ICD-10-CM

## 2021-03-26 DIAGNOSIS — N138 Other obstructive and reflux uropathy: Secondary | ICD-10-CM

## 2021-03-26 DIAGNOSIS — N401 Enlarged prostate with lower urinary tract symptoms: Secondary | ICD-10-CM

## 2021-03-29 NOTE — Addendum Note (Signed)
Addended by: [name] on: 03/29/2021 09:54 AM   Modules accepted: Orders

## 2021-03-30 ENCOUNTER — Encounter: Payer: Self-pay | Admitting: Gastroenterology

## 2021-03-30 ENCOUNTER — Ambulatory Visit: Payer: BC Managed Care – PPO | Admitting: Gastroenterology

## 2021-03-30 ENCOUNTER — Other Ambulatory Visit: Payer: Self-pay

## 2021-03-30 VITALS — BP 140/82 | HR 70 | Ht 71.0 in | Wt 190.0 lb

## 2021-03-30 DIAGNOSIS — K449 Diaphragmatic hernia without obstruction or gangrene: Secondary | ICD-10-CM

## 2021-03-30 DIAGNOSIS — K219 Gastro-esophageal reflux disease without esophagitis: Secondary | ICD-10-CM | POA: Diagnosis not present

## 2021-03-30 DIAGNOSIS — R1031 Right lower quadrant pain: Secondary | ICD-10-CM

## 2021-03-30 DIAGNOSIS — R1310 Dysphagia, unspecified: Secondary | ICD-10-CM

## 2021-03-30 MED ORDER — PANTOPRAZOLE SODIUM 20 MG PO TBEC: 20.0000 mg | DELAYED_RELEASE_TABLET | Freq: Every day | ORAL | 3 refills | Status: DC

## 2021-03-30 NOTE — Progress Notes (Signed)
Chief Complaint:    GERD  GI History: 60 year old male with a history of HTN, HLD, mood disorder, depression, basal cell carcinoma, BPH, GERD, HSV, initially seen in the GI clinic in 07/2020 for evaluation of RLQ/right flank pain x6 months.  - 03/29/2020: Evaluation at [facility] - 03/2020: CT abdomen: Slightly dilated appendix at 8 mm with subtle periappendiceal fat stranding.  Moderate to large volume stool in the rectum.  Slightly thickened urinary bladder.  Small HH.  -Case discussed by ER staff with Surgery.  Treated with antibiotics without need for emergent surgery. -Patient had outpatient follow-up with surgery who recommended conservative management and felt pain not related to appendicitis from CT  -04/28/2020: Normal CMP -06/01/2020: Normal CBC - 07/22/2020: Evaluation in GI clinic.  Scheduled for colonoscopy and started on MiraLAX 1 cap/day - 09/01/2020: Colonoscopy: Normal colon.  Mildly edematous IC valve (path benign).  Normal TI.  Repeat in 10 years  Separately, longstanding history of GERD.  Intermittent solid food dysphagia starting in late 2021. - 09/01/2020: EGD: LA Grade D esophagitis, 1 cm HH, Hill grade 2 valve, empiric 31 French Maloney dilation.  Moderate gastritis (no H. pylori), normal duodenum.  Started on high-dose PPI - Dysphagia resolved with empiric Maloney dilation - 12/07/2020: EGD: Normal esophagus, Hill grade 2 valve, 1 cm HH, minimal gastritis.  Reduced Protonix to 40 mg/day   HPI:     Patient is a 60 y.o. male presenting to the Gastroenterology Clinic for follow-up.  Initially seen by me in 07/2020 for evaluation of RLQ pain along with reflux.  Colonoscopy largely unrevealing.  EGD in 08/2020 with severe erosive esophagitis and moderate gastritis, treated with Protonix 40 mg p.o. twice daily.  Empiric Maloney dilation performed with resolution of dysphagia.  Repeat EGD in 11/2020 with resolution of esophagitis and near resolution of gastritis.   Protonix was reduced to 40 mg/day.  Today, he states GERD well controlled with Protonix 40 mg/day.   The right flank/RLQ pain has since subsided.  He is otherwise without any complaints or active issues today.   Review of systems:     No chest pain, no SOB, no fevers, no urinary sx   Past Medical History:  Diagnosis Date   Anxiety    Atypical chest pain 08/01/2019   Basal cell carcinoma 2006   BPH with obstruction/lower urinary tract symptoms 10/19/2014   Carpal tunnel syndrome 02/07/2016   Chronic rhinitis 07/24/2016   Depression    Diastolic dysfunction 1/88/4166   Per 2D echo 8/16   ED (erectile dysfunction) of organic origin 09/16/2014   Elevated coronary artery calcium score 02/06/2020   Frozen shoulder 2018   left   GERD (gastroesophageal reflux disease) 12/27/2015   Herpes simplex type 1 antibody positive 01/28/2015   Herpes simplex type 2 infection 01/28/2015   History of chicken pox    History of obstructive sleep apnea 10/29/2015   HTN (hypertension) 08/05/2014   Hyperlipidemia    Hypertension    Left shoulder pain 02/20/2017   Mood disorder ([place]) 08/25/2016   Preventative health care 09/16/2014    Patient's surgical history, family medical history, social history, medications and allergies were all reviewed in Epic    Current Outpatient Medications  Medication Sig Dispense Refill   amLODipine (NORVASC) 10 MG tablet Take 1 tablet by mouth once daily 90 tablet 3   aspirin EC 81 MG tablet Take 1 tablet (81 mg total) by mouth daily. Swallow whole. 90 tablet 3   atorvastatin (  LIPITOR) 80 MG tablet Take 1 tablet (80 mg total) by mouth daily. 90 tablet 3   divalproex (DEPAKOTE) 250 MG DR tablet TAKE 2 TO 3 TABLETS BY MOUTH EVERY EVENING  12   fenofibrate (TRICOR) 145 MG tablet TAKE 1 TABLET (145 MG TOTAL) BY MOUTH DAILY. 30 tablet 5   hydrOXYzine (ATARAX/VISTARIL) 25 MG tablet Take 1 tablet (25 mg total) by mouth every 8 (eight) hours as needed for anxiety. 30 tablet 0   meloxicam  (MOBIC) 7.5 MG tablet TAKE 1 TABLET BY MOUTH ONCE DAILY AS NEEDED FOR PAIN 14 tablet 0   Multiple Vitamins-Minerals (MENS MULTIVITAMIN PLUS PO) Take 1 tablet by mouth daily in the afternoon.     Omega-3 Fatty Acids (FISH OIL) 1000 MG CAPS Take 2 capsules (2,000 mg total) by mouth 2 (two) times daily. 180 capsule 3   pantoprazole (PROTONIX) 40 MG tablet Take 1 tablet (40 mg total) by mouth 2 (two) times daily. 120 tablet 1   QUEtiapine (SEROQUEL) 400 MG tablet Take 400 mg by mouth at bedtime.     tadalafil (CIALIS) 5 MG tablet Take 5 mg by mouth daily as needed.     valACYclovir (VALTREX) 500 MG tablet Take 1 tablet (500 mg total) by mouth daily. 90 tablet 3   valsartan (DIOVAN) 80 MG tablet Take 1 tablet (80 mg total) by mouth daily. 90 tablet 0   No current facility-administered medications for this visit.    Physical Exam:     There were no vitals taken for this visit.  GENERAL:  Pleasant male in NAD PSYCH: : Cooperative, normal affect Musculoskeletal:  Normal muscle tone, normal strength NEURO: Alert and oriented x 3, no focal neurologic deficits   IMPRESSION and PLAN:    1) GERD with erosive esophagitis 2) Hiatal hernia - Excellent clinical and endoscopic response to high-dose PPI.  Symptoms well controlled with Protonix 40 mg/day - Will reduce Protonix to 20 mg/day.  If breakthrough symptoms, will go back up to 40 mg/day and maintain - Continue antireflux lifestyle/dietary modifications  3) Dysphagia - Resolved with empiric esophageal dilation and PPI  4) RLQ pain - Resolved    RTC in 12 months or sooner prn           Francisco Lopez ,DO, FACG 03/30/2021, 1:39 PM

## 2021-03-30 NOTE — Patient Instructions (Signed)
If you are age 60 or older, your body mass index should be between 23-30. Your Body mass index is 26.5 kg/m. If this is out of the aforementioned range listed, please consider follow up with your Primary Care Provider.  If you are age 47 or younger, your body mass index should be between 19-25. Your Body mass index is 26.5 kg/m. If this is out of the aformentioned range listed, please consider follow up with your Primary Care Provider.   __________________________________________________________  The [facility] providers would like to encourage you to use [facility] to communicate with providers for non-urgent requests or questions.  Due to long hold times on the telephone, sending your provider a message by [facility] may be a faster and more efficient way to get a response.  Please allow 48 business hours for a response.  Please remember that this is for non-urgent requests.   Reduce your protonix to 20mg  daily.  Return to the clinic in 1 year or sooner if needed.  Due to recent changes in healthcare laws, you may see the results of your imaging and laboratory studies on MyChart before your provider has had a chance to review them.  We understand that in some cases there may be results that are confusing or concerning to you. Not all laboratory results come back in the same time frame and the provider may be waiting for multiple results in order to interpret others.  Please give [place] 48 hours in order for your provider to thoroughly review all the results before contacting the office for clarification of your results.   Thank you for choosing me and [facility].  Vito Cirigliano, D.O.

## 2021-03-31 ENCOUNTER — Other Ambulatory Visit: Payer: Self-pay | Admitting: Family

## 2021-04-01 MED ORDER — HYDROXYZINE PAMOATE 25 MG PO CAPS: 25.0000 mg | ORAL_CAPSULE | Freq: Three times a day (TID) | ORAL | 1 refills | Status: DC | PRN

## 2021-04-01 NOTE — Addendum Note (Signed)
Addended by: [name] on: 04/01/2021 07:01 AM   Modules accepted: Orders

## 2021-04-04 DIAGNOSIS — F3189 Other bipolar disorder: Secondary | ICD-10-CM | POA: Diagnosis not present

## 2021-04-04 DIAGNOSIS — F410 Panic disorder [episodic paroxysmal anxiety] without agoraphobia: Secondary | ICD-10-CM | POA: Diagnosis not present

## 2021-05-03 ENCOUNTER — Other Ambulatory Visit: Payer: Self-pay | Admitting: Family

## 2021-05-06 ENCOUNTER — Other Ambulatory Visit: Payer: Self-pay | Admitting: Cardiology

## 2021-05-06 DIAGNOSIS — Z79899 Other long term (current) drug therapy: Secondary | ICD-10-CM

## 2021-05-06 DIAGNOSIS — E782 Mixed hyperlipidemia: Secondary | ICD-10-CM

## 2021-05-11 DIAGNOSIS — F4322 Adjustment disorder with anxiety: Secondary | ICD-10-CM | POA: Diagnosis not present

## 2021-05-13 DIAGNOSIS — F4322 Adjustment disorder with anxiety: Secondary | ICD-10-CM | POA: Diagnosis not present

## 2021-05-13 DIAGNOSIS — F4312 Post-traumatic stress disorder, chronic: Secondary | ICD-10-CM | POA: Diagnosis not present

## 2021-06-09 DIAGNOSIS — F4312 Post-traumatic stress disorder, chronic: Secondary | ICD-10-CM | POA: Diagnosis not present

## 2021-06-09 DIAGNOSIS — F4322 Adjustment disorder with anxiety: Secondary | ICD-10-CM | POA: Diagnosis not present

## 2021-06-22 IMAGING — CT CT CARDIAC CORONARY ARTERY CALCIUM SCORE
3 series · 14 of 20 positions shown, 15 images · non-contrast
Comparison: None.
COMPARISON: None.

Addendum:
EXAM:
OVER-READ INTERPRETATION  CT CHEST

The following report is an over-read performed by radiologist Dr.
Mandus Verdooren [REDACTED] on 01/19/2020. This over-read
does not include interpretation of cardiac or coronary anatomy or
pathology. The coronary calcium score interpretation by the
cardiologist is attached.
CLINICAL DATA: Risk stratification 59 year old male with family
history of CAD
Coronary Calcium Score
TECHNIQUE: The patient was scanned on a Siemens Force scanner. Axial
non-contrast 3 mm slices were carried out through the heart. The
data set was analyzed on a dedicated work station and scored using
the Agatson method.

[Series 2: casc 3.0 bv41 2 bestdiast 69 % · axial · 0.38mm/px · z∈[-255,-168]mm · 4 of 49 slices shown, 5 images]
[im 10/49  vessel]
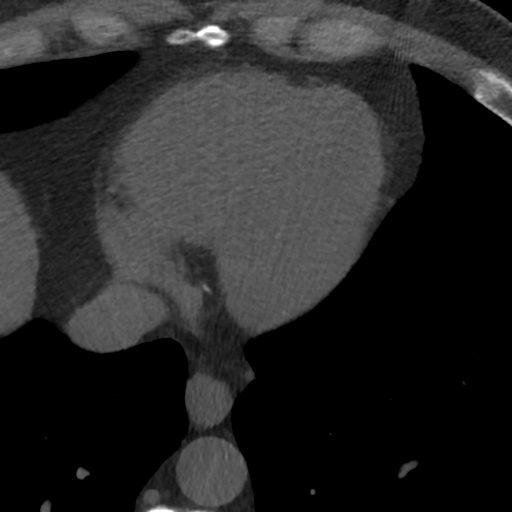
[im 10/49  lung]
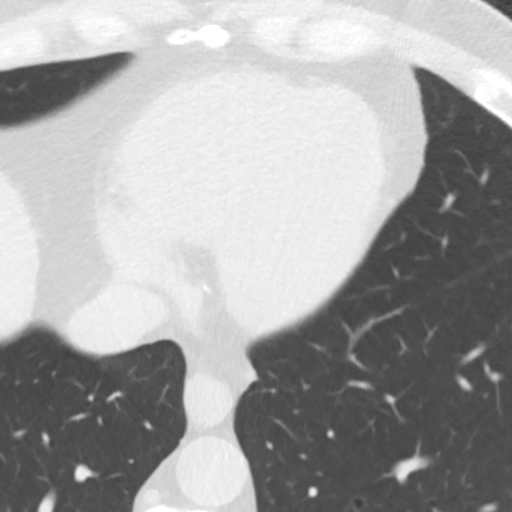
[im 20/49  vessel]
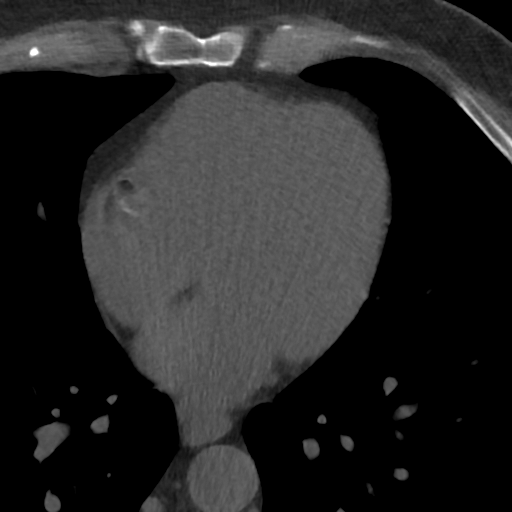
[im 29/49  vessel]
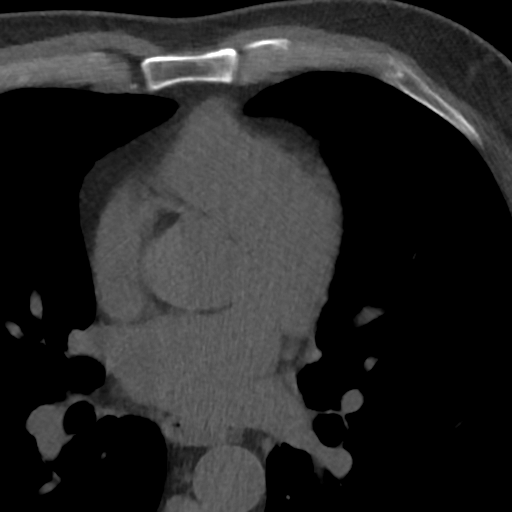
[im 39/49  vessel]
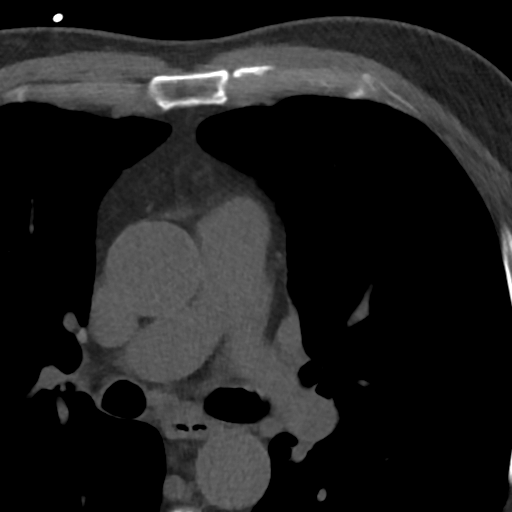

[Series 3: lung 68 % · axial · 0.76mm/px · z∈[-258,-162]mm · 5 of 49 slices shown]
[im 9/49  lung]
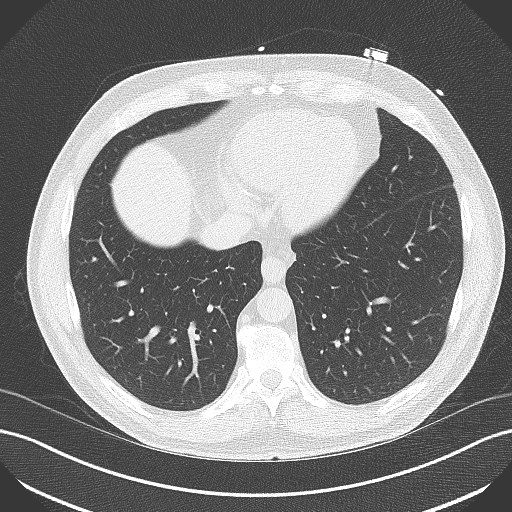
[im 17/49  lung]
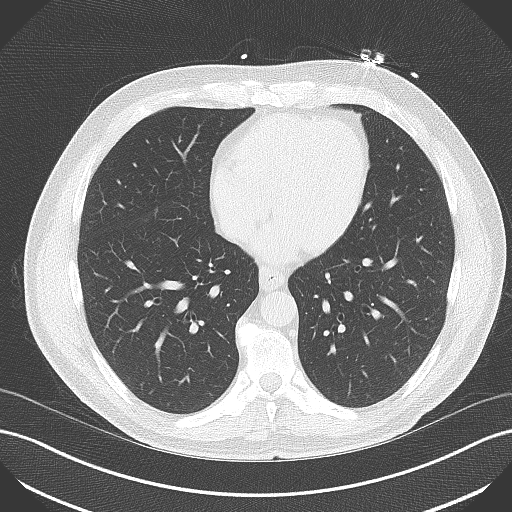
[im 25/49  lung]
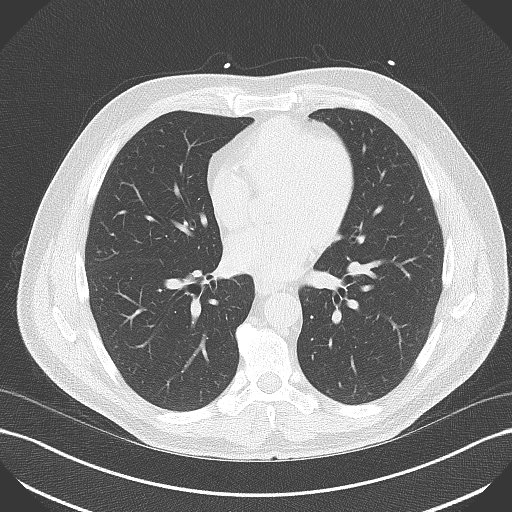
[im 33/49  lung]
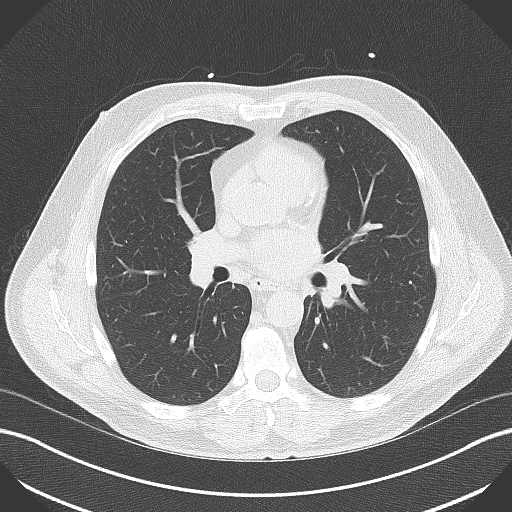
[im 41/49  lung]
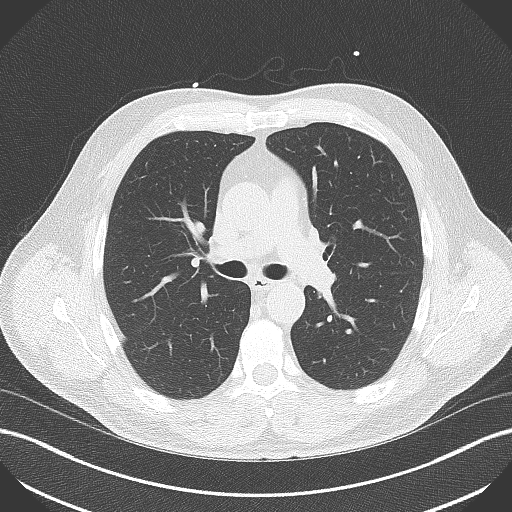

[Series 4: lung st 68 % · axial · 0.76mm/px · z∈[-258,-162]mm · 5 of 49 slices shown]
[im 9/49  lung]
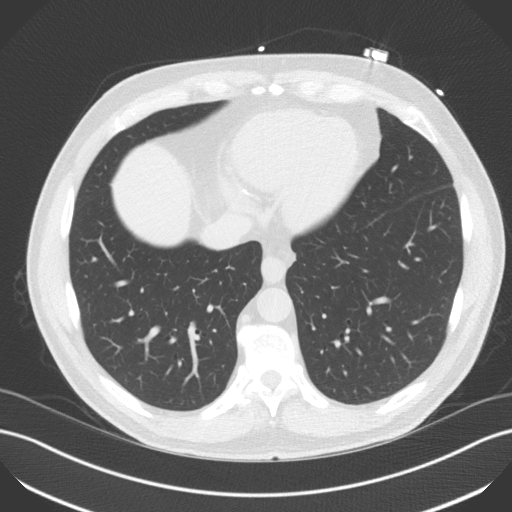
[im 17/49  lung]
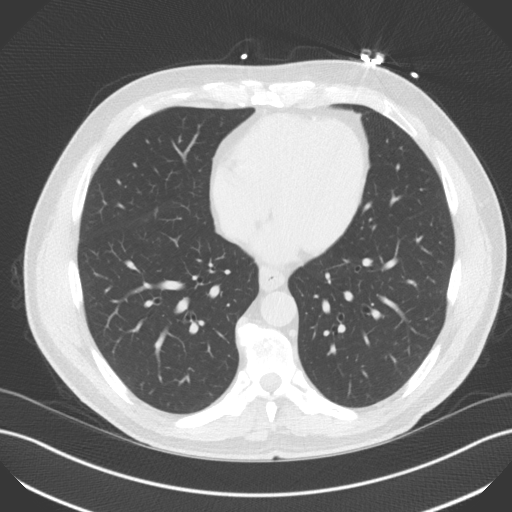
[im 25/49  lung]
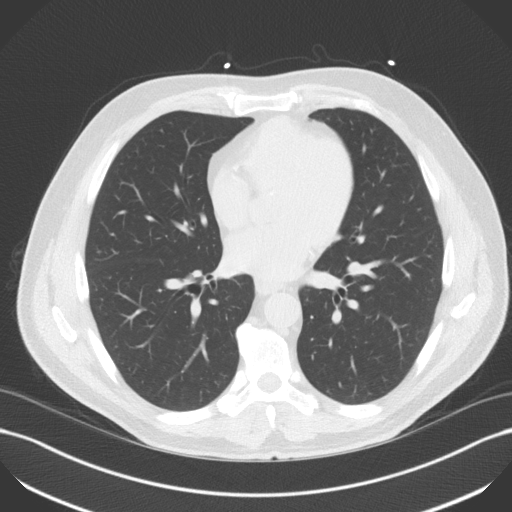
[im 33/49  lung]
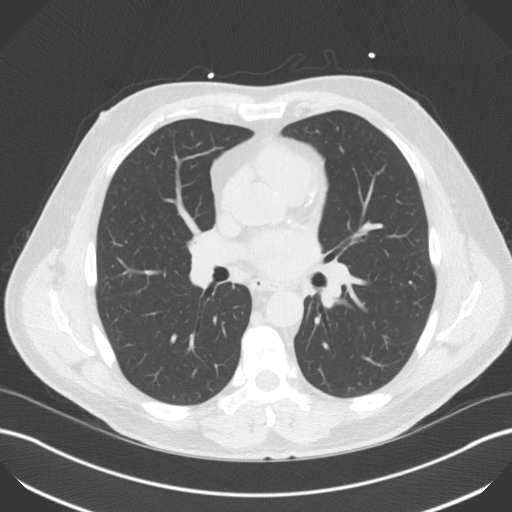
[im 41/49  lung]
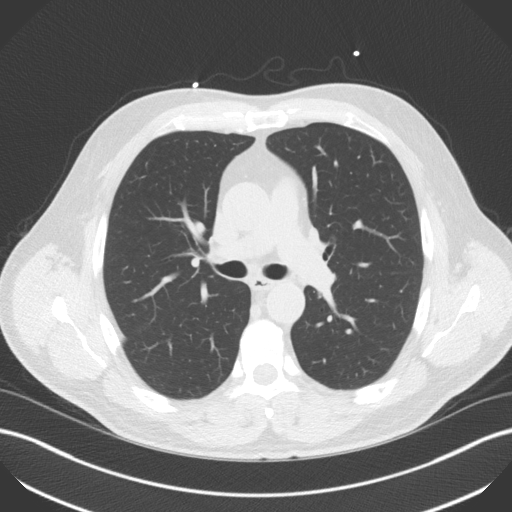

[14 of 20 positions shown; findings below may reference images not displayed]

FINDINGS: Vascular: Heart is normal size. Aorta normal caliber. Scattered
calcifications in the aortic arch and descending thoracic aorta.

Mediastinum/Nodes: No adenopathy

Lungs/Pleura: Small 2 mm left lower lobe nodule on image 48
peripherally. No confluent opacities or effusions.

Upper Abdomen: Imaging into the upper abdomen demonstrates no acute
findings.

Musculoskeletal: Chest wall soft tissues are unremarkable. No acute
bony abnormality.
IMPRESSION: Aortic atherosclerosis.

2 mm peripheral left lower lobe pulmonary nodule. No follow-up
needed if patient is low-risk. Non-contrast chest CT can be
considered in 12 months if patient is high-risk. This recommendation
follows the consensus statement: Guidelines for Management of
Incidental Pulmonary Nodules Detected on CT Images: From the
FINDINGS: Non-cardiac: See separate report from [REDACTED].

Aorta: Normal size.  Descending atherosclerosis.

Pericardium: Normal

Coronary arteries: Normal origins.  LAD, CIRC and RCA calcifications
IMPRESSION: 1. Coronary calcium score of 391 this was 89 percentile for age and
sex matched control.

2.  Descending aortic atherosclerosis.

*** End of Addendum ***
EXAM:
OVER-READ INTERPRETATION  CT CHEST

The following report is an over-read performed by radiologist Dr.
Mandus Verdooren [REDACTED] on 01/19/2020. This over-read
does not include interpretation of cardiac or coronary anatomy or
pathology. The coronary calcium score interpretation by the
cardiologist is attached.
FINDINGS: Vascular: Heart is normal size. Aorta normal caliber. Scattered
calcifications in the aortic arch and descending thoracic aorta.

Mediastinum/Nodes: No adenopathy

Lungs/Pleura: Small 2 mm left lower lobe nodule on image 48
peripherally. No confluent opacities or effusions.

Upper Abdomen: Imaging into the upper abdomen demonstrates no acute
findings.

Musculoskeletal: Chest wall soft tissues are unremarkable. No acute
bony abnormality.
IMPRESSION: Aortic atherosclerosis.

2 mm peripheral left lower lobe pulmonary nodule. No follow-up
needed if patient is low-risk. Non-contrast chest CT can be
considered in 12 months if patient is high-risk. This recommendation
follows the consensus statement: Guidelines for Management of
Incidental Pulmonary Nodules Detected on CT Images: From the

## 2021-07-07 DIAGNOSIS — F4322 Adjustment disorder with anxiety: Secondary | ICD-10-CM | POA: Diagnosis not present

## 2021-07-07 DIAGNOSIS — F4312 Post-traumatic stress disorder, chronic: Secondary | ICD-10-CM | POA: Diagnosis not present

## 2021-08-20 IMAGING — US US SCROTUM W/ DOPPLER COMPLETE
1 series · 13 of 25 positions shown · non-contrast
Comparison: None.
COMPARISON: None.

Addendum:
CLINICAL DATA: Right testicular pain for 2 weeks

EXAM:
SCROTAL ULTRASOUND
DOPPLER ULTRASOUND OF THE TESTICLES
TECHNIQUE: Complete ultrasound examination of the testicles, epididymis, and
other scrotal structures was performed. Color and spectral Doppler
ultrasound were also utilized to evaluate blood flow to the
testicles.

[Series 1: us scrotum w/ doppler complete · 35 acquisitions, 13 frames shown]
[im 1/35]
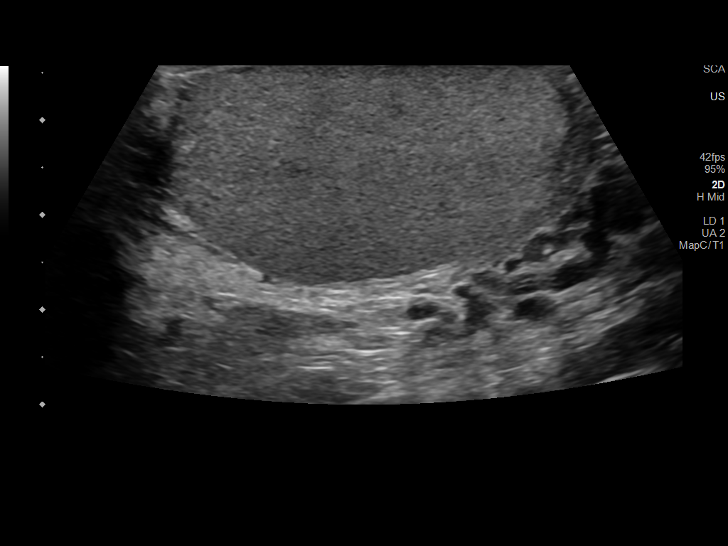
[im 3/35]
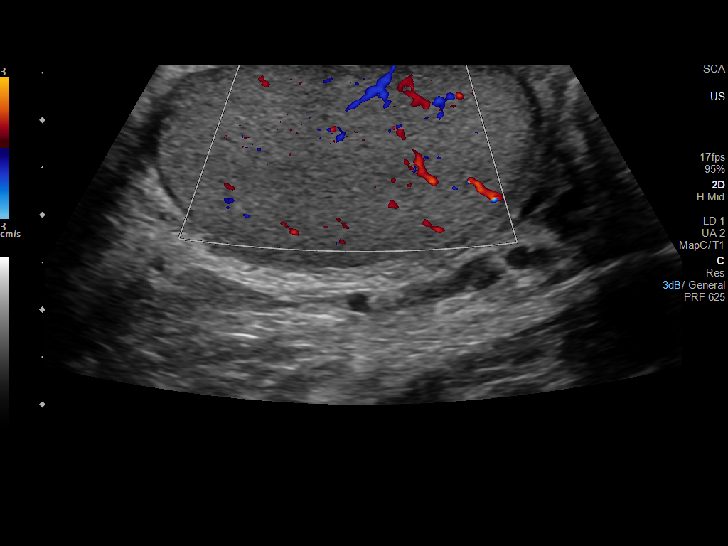
[im 6/35]
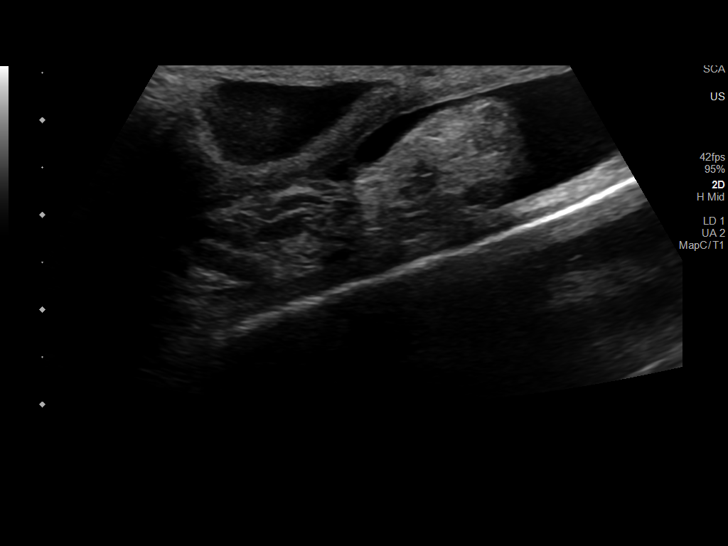
[im 9/35]
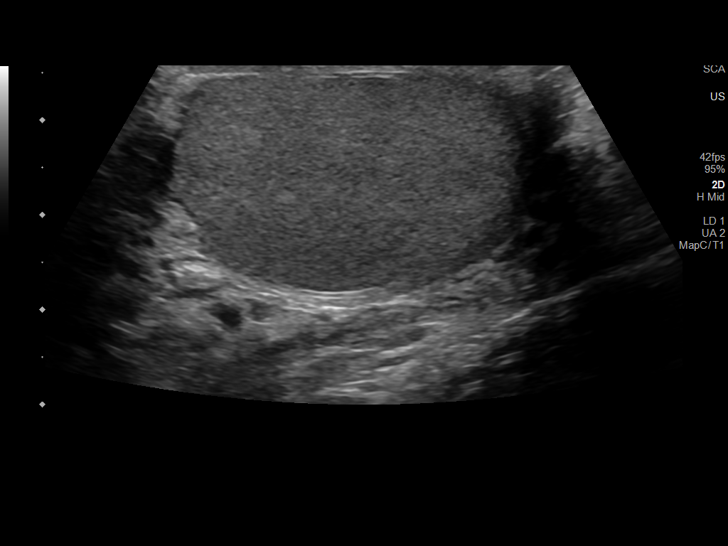
[im 12/35]
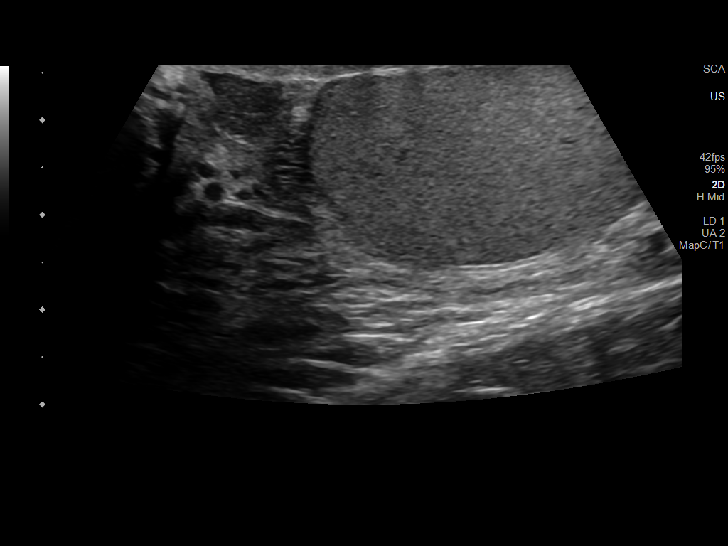
[im 15/35]
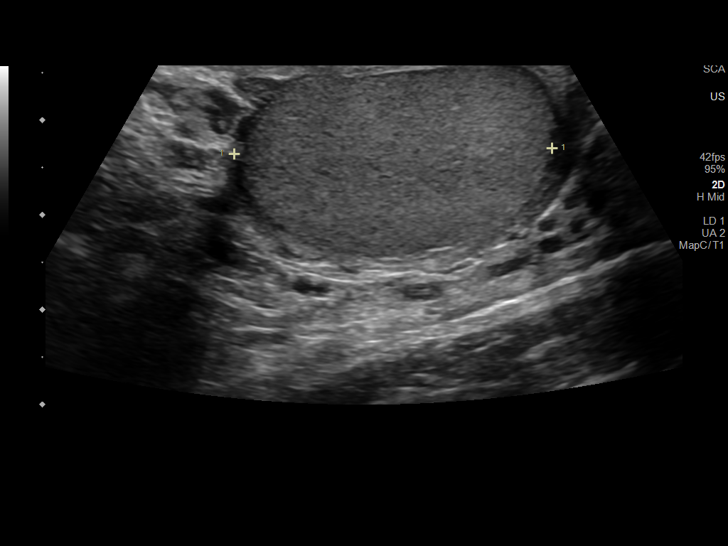
[im 18/35]
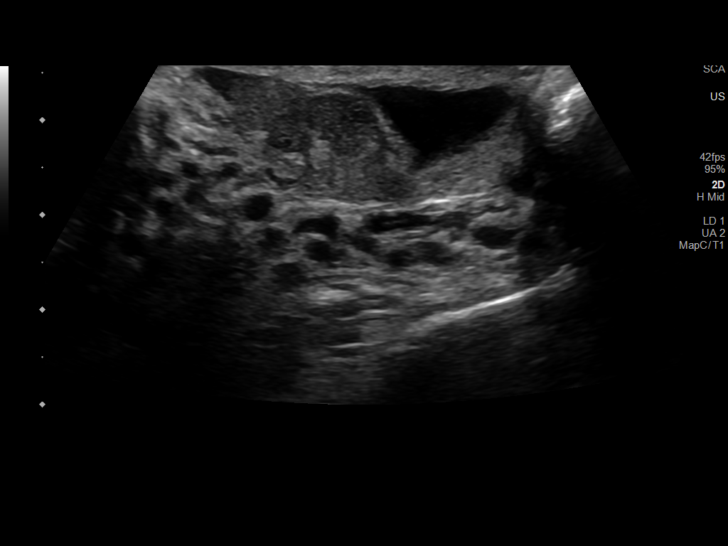
[im 20/35]
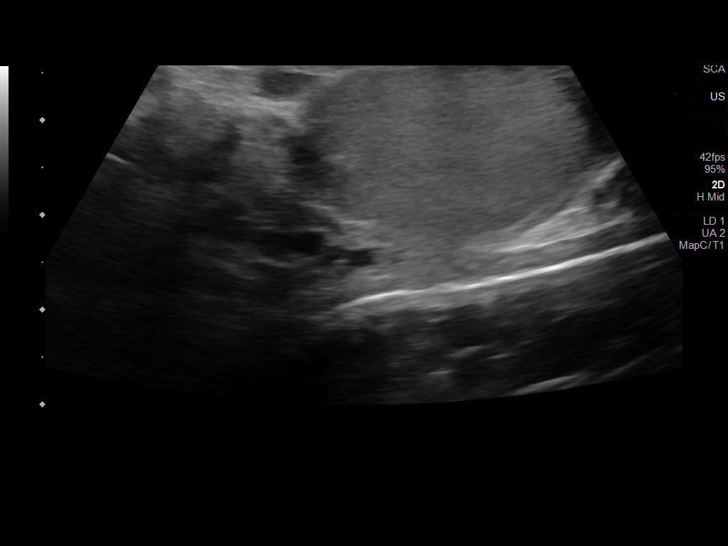
[im 23/35]
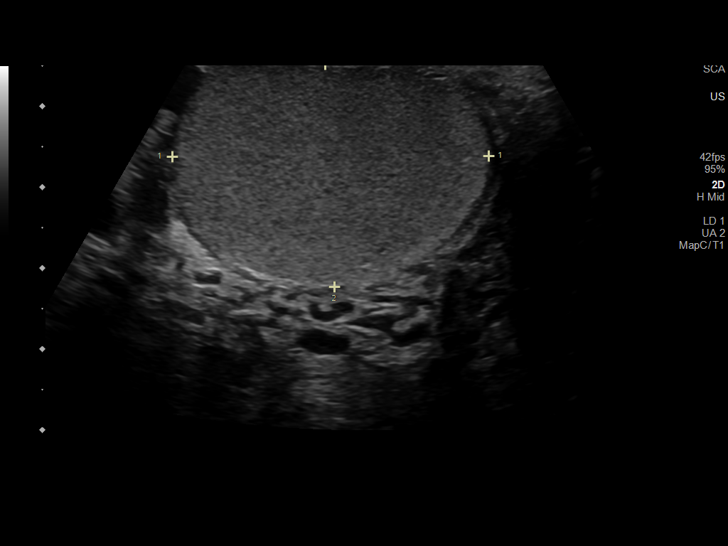
[im 26/35]
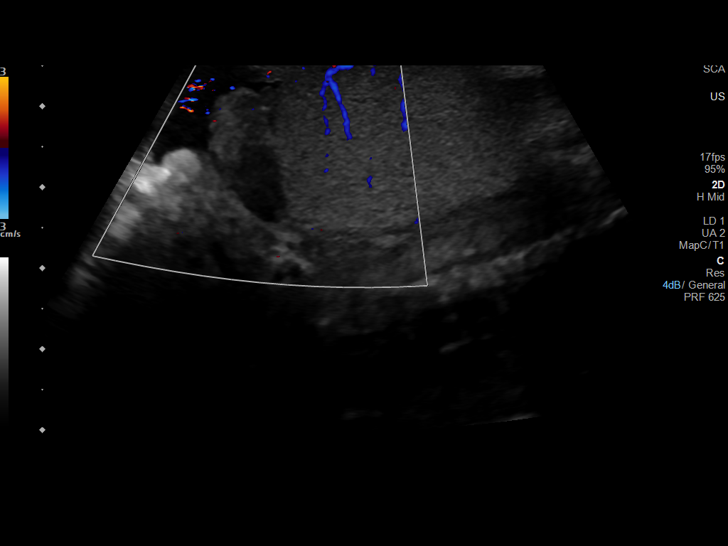
[im 29/35]
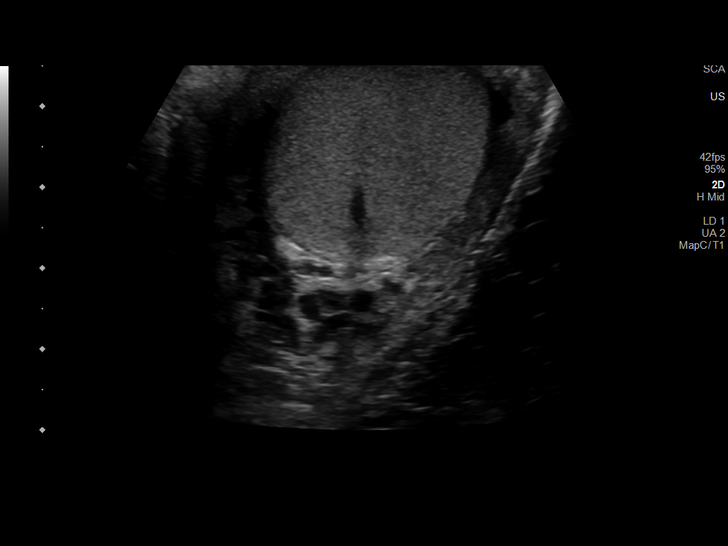
[im 32/35]
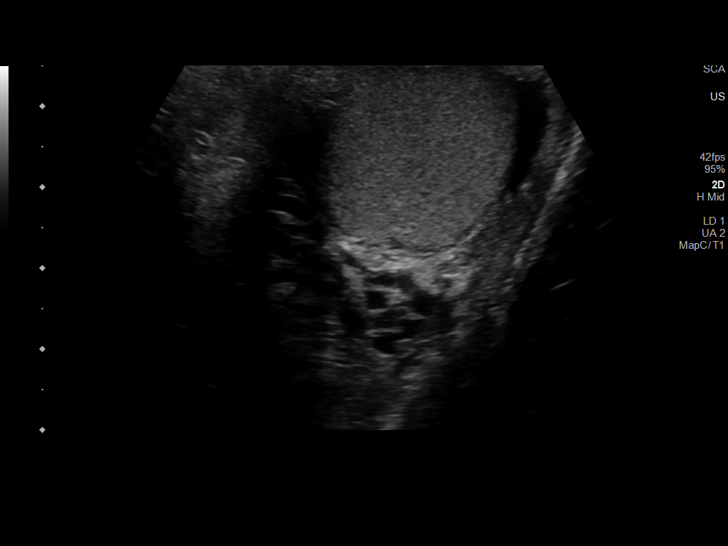
[im 35/35]
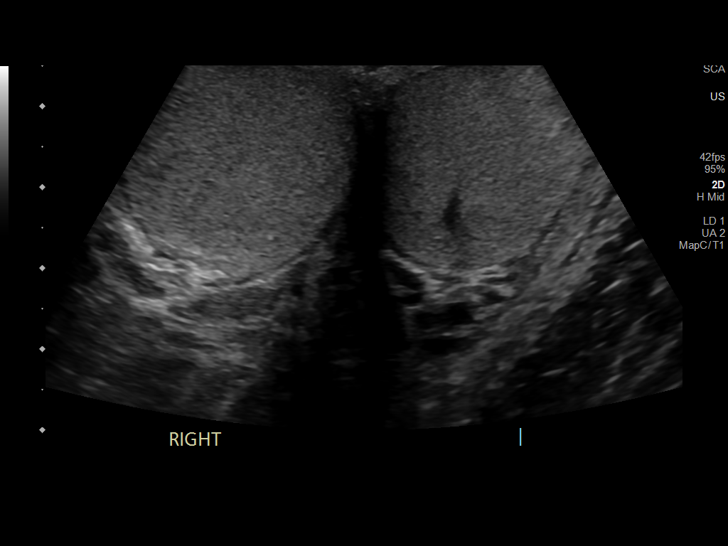

[13 of 25 positions shown; findings below may reference images not displayed]

FINDINGS: Right testicle

Measurements: 4.2 x 2.3 x 3.4 cm. No mass or microlithiasis
visualized.

Left testicle

Measurements: 3.9 x 2.8 x 2.8 cm. No mass or microlithiasis
visualized.

Right epididymis:  Normal in size and appearance.

Left epididymis:  Normal in size and appearance.

Hydrocele:  None visualized.

Varicocele:  None visualized.

Color flow is seen within the bilateral testes. Complete Doppler
tracings were not performed.
IMPRESSION: 1. Color flow is seen within the bilateral testes. Full Doppler
interrogation was not performed. According to the technologist,
patient will return for full Doppler interrogation of the bilateral
testes, at which point an addendum will be made to this report with
the complete findings.
2. Normal sonographic appearance of the bilateral testes. No
intratesticular mass or epididymal abnormality.

ADDENDUM:
Patient returned for additional imaging with spectral Doppler
interrogation. Arterial and venous waveforms are present
bilaterally. Examination is negative for evidence of testicular
torsion. No additional changes to the original report.

*** End of Addendum ***
FINDINGS: Right testicle

Measurements: 4.2 x 2.3 x 3.4 cm. No mass or microlithiasis
visualized.

Left testicle

Measurements: 3.9 x 2.8 x 2.8 cm. No mass or microlithiasis
visualized.

Right epididymis:  Normal in size and appearance.

Left epididymis:  Normal in size and appearance.

Hydrocele:  None visualized.

Varicocele:  None visualized.

Color flow is seen within the bilateral testes. Complete Doppler
tracings were not performed.
IMPRESSION: 1. Color flow is seen within the bilateral testes. Full Doppler
interrogation was not performed. According to the technologist,
patient will return for full Doppler interrogation of the bilateral
testes, at which point an addendum will be made to this report with
the complete findings.
2. Normal sonographic appearance of the bilateral testes. No
intratesticular mass or epididymal abnormality.

## 2021-09-05 ENCOUNTER — Ambulatory Visit: Payer: BC Managed Care – PPO | Admitting: Family

## 2021-09-05 ENCOUNTER — Telehealth: Payer: Self-pay | Admitting: Family

## 2021-09-05 VITALS — BP 146/78 | HR 77 | Temp 98.0°F | Resp 16 | Ht 69.0 in | Wt 209.0 lb

## 2021-09-05 DIAGNOSIS — N401 Enlarged prostate with lower urinary tract symptoms: Secondary | ICD-10-CM

## 2021-09-05 DIAGNOSIS — I10 Essential (primary) hypertension: Secondary | ICD-10-CM

## 2021-09-05 DIAGNOSIS — E785 Hyperlipidemia, unspecified: Secondary | ICD-10-CM | POA: Diagnosis not present

## 2021-09-05 DIAGNOSIS — F32A Depression, unspecified: Secondary | ICD-10-CM | POA: Diagnosis not present

## 2021-09-05 DIAGNOSIS — Z23 Encounter for immunization: Secondary | ICD-10-CM | POA: Diagnosis not present

## 2021-09-05 DIAGNOSIS — K219 Gastro-esophageal reflux disease without esophagitis: Secondary | ICD-10-CM | POA: Diagnosis not present

## 2021-09-05 DIAGNOSIS — N138 Other obstructive and reflux uropathy: Secondary | ICD-10-CM

## 2021-09-05 LAB — BASIC METABOLIC PANEL
BUN: 19 mg/dL (ref 6–23)
CO2: 26 mEq/L (ref 19–32)
Calcium: 9.6 mg/dL (ref 8.4–10.5)
Chloride: 104 mEq/L (ref 96–112)
Creatinine, Ser: 0.94 mg/dL (ref 0.40–1.50)
GFR: 87.97 mL/min (ref >60.00)
Glucose, Bld: 104 mg/dL — ABNORMAL HIGH (ref 70–99)
Potassium: 4.1 mEq/L (ref 3.5–5.1)
Sodium: 138 mEq/L (ref 135–145)

## 2021-09-05 MED ORDER — TADALAFIL 5 MG PO TABS: 5.0000 mg | ORAL_TABLET | Freq: Every day | ORAL | 1 refills | Status: DC

## 2021-09-05 MED ORDER — VALSARTAN 160 MG PO TABS: 160.0000 mg | ORAL_TABLET | Freq: Every day | ORAL | 1 refills | Status: DC

## 2021-09-05 NOTE — Assessment & Plan Note (Signed)
Lab Results  ?Component Value Date  ? CHOL 150 03/21/2021  ? HDL 43.10 03/21/2021  ? [place] 84 03/21/2021  ? LDLDIRECT 97.0 12/27/2015  ? TRIG 115.0 03/21/2021  ? CHOLHDL 3 03/21/2021  ? ?Stable on atorvastatin '80mg'$ . Continue same.  ?

## 2021-09-05 NOTE — Telephone Encounter (Signed)
Please Call [facility] on [place] main and request date of shingrix vaccine.  ?

## 2021-09-05 NOTE — Assessment & Plan Note (Signed)
Stable on decreased dose of protonix, '20mg'$ . Continue same.   ?

## 2021-09-05 NOTE — Assessment & Plan Note (Addendum)
BP Readings from Last 3 Encounters:  ?09/05/21 (!) 146/78  ?03/30/21 140/82  ?03/21/21 137/83  ? ?Above goal. Will increase valsartan to '160mg'$ . Continue amlodipine.  ?

## 2021-09-05 NOTE — Assessment & Plan Note (Signed)
Uncontrolled. Change cialis to once daily.  ?

## 2021-09-05 NOTE — Progress Notes (Addendum)
? ?Subjective:  ? ?By signing my name below, I, Francisco Lopez, attest that this documentation has been prepared under the direction and in the presence of [name], NP. 09/05/2021 ? ? ? Patient ID: Francisco Lopez, male    DOB: [birth year], 61 y.o.   MRN: [id number] ? ?Chief Complaint  ?Patient presents with  ? Hypertension  ?  Here for follow up  ? Hyperlipidemia  ?  Here for follow up  ? ? ?Patient is in today for a follow up visit.  ? ?Hypertension- His blood pressure is elevated during this visit. He continues taking 10 mg amlodipine daily PO, 80 mg valsartan daily PO and reports no new issues while taking it. He is willing to increase his valsartan dosage to 160 mg to manage his elevated blood pressure.  ?BP Readings from Last 3 Encounters:  ?09/05/21 (!) 146/78  ?03/30/21 140/82  ?03/21/21 137/83  ? ?Pulse Readings from Last 3 Encounters:  ?09/05/21 77  ?03/30/21 70  ?03/21/21 68  ? ?Hyperlipidemia- He continues taking 8- mg atorvastatin daily PO and reports no new issues while taking it.  ?Lab Results  ?Component Value Date  ? CHOL 150 03/21/2021  ? HDL 43.10 03/21/2021  ? [place] 84 03/21/2021  ? LDLDIRECT 97.0 12/27/2015  ? TRIG 115.0 03/21/2021  ? CHOLHDL 3 03/21/2021  ? ?Reflux- He reports taking 20 mg Protonix daily PO to manage his symptoms and reports doing well while taking it.  ?Psychiatrist- He continues seeing a psychiatrist regularly and reports doing well while taking it. He reports gaining weight since seeing them due to feeling depressed but is planning on losing his gained weight.  ?Cialis- He continues taking Cialis PRN to manage his symptoms and reports no new issues while taking it. He is willing to increase his dosage to better manage his symptoms.  ?Immunizations- He is UTD on shingles vaccines. He reports receiving one vaccine in office and another one at his pharmacy. He is due for tetanus vaccine and is interested in receiving it during this visit.  ? ? ?Health Maintenance Due   ?Topic Date Due  ? Zoster Vaccines- Shingrix (2 of 2) 09/26/2019  ? TETANUS/TDAP  06/12/2021  ? ? ?Past Medical History:  ?Diagnosis Date  ? Anxiety   ? Atypical chest pain 08/01/2019  ? Basal cell carcinoma 2006  ? BPH with obstruction/lower urinary tract symptoms 10/19/2014  ? Carpal tunnel syndrome 02/07/2016  ? Chronic rhinitis 07/24/2016  ? Depression   ? Diastolic dysfunction 4/92/0100  ? Per 2D echo 8/16  ? ED (erectile dysfunction) of organic origin 09/16/2014  ? Elevated coronary artery calcium score 02/06/2020  ? Frozen shoulder 2018  ? left  ? GERD (gastroesophageal reflux disease) 12/27/2015  ? Herpes simplex type 1 antibody positive 01/28/2015  ? Herpes simplex type 2 infection 01/28/2015  ? History of chicken pox   ? History of obstructive sleep apnea 10/29/2015  ? HTN (hypertension) 08/05/2014  ? Hyperlipidemia   ? Hypertension   ? Left shoulder pain 02/20/2017  ? Mood disorder ([place]) 08/25/2016  ? Preventative health care 09/16/2014  ? ? ?Past Surgical History:  ?Procedure Laterality Date  ? COLONOSCOPY    ? around [facility] in [place]  ? KNEE SURGERY Left 2000  ? meninscus repair  ? SHOULDER ARTHROSCOPY Left 01/2018  ? UPPER GASTROINTESTINAL ENDOSCOPY    ? ? ?Family History  ?Problem Relation Age of Onset  ? Diabetes Mother   ?     type  II  ? Anxiety disorder Mother   ? Hypertension Father   ? Alcohol abuse Father   ? Liver cancer Sister   ? Hemachromatosis Sister   ? COPD Sister   ?     died from covid complication  ? Diabetes Mellitus II Sister   ? Hemachromatosis Brother   ? Liver cancer Brother   ? CAD Brother   ?     smoker  ? CAD Brother   ?     smoker  ? Colon cancer Neg Hx   ? Esophageal cancer Neg Hx   ? Stomach cancer Neg Hx   ? Rectal cancer Neg Hx   ? ? ?Social History  ? ?Socioeconomic History  ? Marital status: Divorced  ?  Spouse name: Not on file  ? Number of children: Not on file  ? Years of education: Not on file  ? Highest education level: Not on file  ?Occupational History  ? Not on  file  ?Tobacco Use  ? Smoking status: Never  ? Smokeless tobacco: Never  ?Vaping Use  ? Vaping Use: Never used  ?Substance and Sexual Activity  ? Alcohol use: No  ? Drug use: No  ? Sexual activity: Yes  ?Other Topics Concern  ? Not on file  ?Social History Narrative  ? Separated  ? 1 child (81 yr old daughter)- lives in [place]  ? 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 Completed 10th grade  ? ?Social Determinants of Health  ? ?Financial Resource Strain: Not on file  ?Food Insecurity: Not on file  ?Transportation Needs: Not on file  ?Physical Activity: Not on file  ?Stress: Not on file  ?Social Connections: Not on file  ?Intimate Partner Violence: Not on file  ? ? ?Outpatient Medications Prior to Visit  ?Medication Sig Dispense Refill  ? amLODipine (NORVASC) 10 MG tablet Take 1 tablet by mouth once daily 90 tablet 3  ? aspirin EC 81 MG tablet Take 1 tablet (81 mg total) by mouth daily. Swallow whole. 90 tablet 3  ? atorvastatin (LIPITOR) 80 MG tablet TAKE 1 TABLET BY MOUTH EVERY DAY 30 tablet 11  ? divalproex (DEPAKOTE) 250 MG DR tablet TAKE 2 TO 3 TABLETS BY MOUTH EVERY EVENING  12  ? fenofibrate (TRICOR) 145 MG tablet TAKE 1 TABLET (145 MG TOTAL) BY MOUTH DAILY. 30 tablet 5  ? hydrOXYzine (VISTARIL) 25 MG capsule Take 1 capsule (25 mg total) by mouth every 8 (eight) hours as needed. 30 capsule 1  ? meloxicam (MOBIC) 7.5 MG tablet TAKE 1 TABLET BY MOUTH ONCE DAILY AS NEEDED FOR PAIN 14 tablet 0  ? Multiple Vitamins-Minerals (MENS MULTIVITAMIN PLUS PO) Take 1 tablet by mouth daily in the afternoon.    ? Omega-3 Fatty Acids (FISH OIL) 1000 MG CAPS Take 2 capsules (2,000 mg total) by mouth 2 (two) times daily. 180 capsule 3  ? pantoprazole (PROTONIX) 20 MG tablet Take 1 tablet (20 mg total) by mouth daily. 90 tablet 3  ? QUEtiapine (SEROQUEL) 400 MG tablet Take 400 mg by mouth at bedtime.    ? valACYclovir (VALTREX) 500 MG tablet TAKE 1 TABLET BY MOUTH EVERY DAY 90 tablet 4  ? tadalafil (CIALIS) 5 MG tablet Take 5 mg  by mouth daily as needed.    ? valsartan (DIOVAN) 80 MG tablet Take 1 tablet (80 mg total) by mouth daily. 90 tablet 0  ? ?No facility-administered medications prior to visit.  ? ? ?No Known Allergies ? ?  ROS ? ?  See HPI ?Objective:  ?  ?Physical Exam ?Constitutional:   ?   General: He is not in acute distress. ?   Appearance: Normal appearance. He is not ill-appearing.  ?HENT:  ?   Head: Normocephalic and atraumatic.  ?   Right Ear: External ear normal.  ?   Left Ear: External ear normal.  ?Eyes:  ?   Extraocular Movements: Extraocular movements intact.  ?   Pupils: Pupils are equal, round, and reactive to light.  ?Cardiovascular:  ?   Rate and Rhythm: Normal rate and regular rhythm.  ?   Heart sounds: Normal heart sounds. No murmur heard. ?  No gallop.  ?Pulmonary:  ?   Effort: Pulmonary effort is normal. No respiratory distress.  ?   Breath sounds: Normal breath sounds. No wheezing or rales.  ?Skin: ?   General: Skin is warm and dry.  ?Neurological:  ?   Mental Status: He is alert and oriented to person, place, and time.  ?Psychiatric:     ?   Judgment: Judgment normal.  ? ? ?BP (!) 146/78 (BP Location: Right Arm, Patient Position: Sitting, Cuff Size: Small)   Pulse 77   Temp 98 ?F (36.7 ?C) (Oral)   Resp 16   Ht '5\' 9"'$  (1.753 m)   Wt 209 lb (94.8 kg)   SpO2 99%   BMI 30.86 kg/m?  ?Wt Readings from Last 3 Encounters:  ?09/05/21 209 lb (94.8 kg)  ?03/30/21 190 lb (86.2 kg)  ?03/21/21 188 lb (85.3 kg)  ? ? ?   ?Assessment & Plan:  ? ?Problem List Items Addressed This Visit   ? ?  ? Unprioritized  ? Hyperlipidemia  ?  Lab Results  ?Component Value Date  ? CHOL 150 03/21/2021  ? HDL 43.10 03/21/2021  ? [place] 84 03/21/2021  ? LDLDIRECT 97.0 12/27/2015  ? TRIG 115.0 03/21/2021  ? CHOLHDL 3 03/21/2021  ?Stable on atorvastatin '80mg'$ . Continue same.  ?  ?  ? Relevant Medications  ? valsartan (DIOVAN) 160 MG tablet  ? tadalafil (CIALIS) 5 MG tablet  ? HTN (hypertension)  ?  BP Readings from Last 3 Encounters:   ?09/05/21 (!) 146/78  ?03/30/21 140/82  ?03/21/21 137/83  ?Above goal. Will increase valsartan to '160mg'$ . Continue amlodipine.  ?  ?  ? Relevant Medications  ? valsartan (DIOVAN) 160 MG tablet  ? tadalafil (CIALIS) 5 MG

## 2021-09-05 NOTE — Assessment & Plan Note (Signed)
Stable, management per psychiatry.  ?

## 2021-09-05 NOTE — Patient Instructions (Signed)
Please complete lab work prior to leaving. ?Increase valsartan from '80mg'$  to '160mg'$ .  ?

## 2021-09-05 NOTE — Addendum Note (Signed)
Addended by: Jiles Prows on: 09/05/2021 04:50 PM ? ? Modules accepted: Orders ? ?

## 2021-09-06 DIAGNOSIS — H52223 Regular astigmatism, bilateral: Secondary | ICD-10-CM | POA: Diagnosis not present

## 2021-09-07 NOTE — Telephone Encounter (Signed)
Per pharmacist he had a shingrix injection 01-27-2021 ?

## 2021-09-19 ENCOUNTER — Ambulatory Visit: Payer: BC Managed Care – PPO | Admitting: Family

## 2021-09-23 ENCOUNTER — Encounter: Payer: Self-pay | Admitting: Family

## 2021-09-23 ENCOUNTER — Ambulatory Visit: Payer: BC Managed Care – PPO | Admitting: Family

## 2021-09-23 VITALS — BP 132/80 | HR 74 | Temp 97.8°F | Ht 69.0 in | Wt 201.8 lb

## 2021-09-23 DIAGNOSIS — Z8249 Family history of ischemic heart disease and other diseases of the circulatory system: Secondary | ICD-10-CM

## 2021-09-23 DIAGNOSIS — N401 Enlarged prostate with lower urinary tract symptoms: Secondary | ICD-10-CM

## 2021-09-23 DIAGNOSIS — I10 Essential (primary) hypertension: Secondary | ICD-10-CM

## 2021-09-23 DIAGNOSIS — N138 Other obstructive and reflux uropathy: Secondary | ICD-10-CM

## 2021-09-23 DIAGNOSIS — N529 Male erectile dysfunction, unspecified: Secondary | ICD-10-CM | POA: Diagnosis not present

## 2021-09-23 HISTORY — DX: Family history of ischemic heart disease and other diseases of the circulatory system: Z82.49

## 2021-09-23 LAB — BASIC METABOLIC PANEL
BUN: 17 mg/dL (ref 6–23)
CO2: 26 mEq/L (ref 19–32)
Calcium: 9.3 mg/dL (ref 8.4–10.5)
Chloride: 105 mEq/L (ref 96–112)
Creatinine, Ser: 0.93 mg/dL (ref 0.40–1.50)
GFR: 89.08 mL/min (ref >60.00)
Glucose, Bld: 108 mg/dL — ABNORMAL HIGH (ref 70–99)
Potassium: 4.2 mEq/L (ref 3.5–5.1)
Sodium: 139 mEq/L (ref 135–145)

## 2021-09-23 MED ORDER — TADALAFIL 5 MG PO TABS: ORAL_TABLET | ORAL | 1 refills | Status: DC

## 2021-09-23 NOTE — Assessment & Plan Note (Addendum)
BP Readings from Last 3 Encounters:  ?09/23/21 132/80  ?09/05/21 (!) 146/78  ?03/30/21 140/82  ? ?Improved and at goal. Will continue increased dose of diovan as well as amlodipine.  ? ?Wt Readings from Last 3 Encounters:  ?09/23/21 201 lb 12.8 oz (91.5 kg)  ?09/05/21 209 lb (94.8 kg)  ?03/30/21 190 lb (86.2 kg)  ? ? ?

## 2021-09-23 NOTE — Progress Notes (Signed)
? ?Subjective:  ? ?By signing my name below, I, Francisco Lopez, attest that this documentation has been prepared under the direction and in the presence of [name] NP, 09/23/2021   ? ? Patient ID: Francisco Lopez, male    DOB: 1960/10/27, 61 y.o.   MRN: [id number] ? ?Chief Complaint  ?Patient presents with  ? Medical Management of Chronic Issues  ?  F/u   ? ? ?HPI ?Patient is in today for an office visit. ? ?Blood Pressure - As of today's visit, his blood pressure is decreasing. He has been doing well with taking 160 MG of Diovan. ?BP Readings from Last 3 Encounters:  ?09/23/21 132/80  ?09/05/21 (!) 146/78  ?03/30/21 140/82  ? ?Weight - His weight has been decreasing. ?Wt Readings from Last 3 Encounters:  ?09/23/21 201 lb 12.8 oz (91.5 kg)  ?09/05/21 209 lb (94.8 kg)  ?03/30/21 190 lb (86.2 kg)  ? ?ED - He has been taking an increased dose of 5 MG of Cialis. He states that his ED has been about the same.  ? ?Heart Disease - He is concerned about heart disease. He has a family history of heart disease so the patient is inquiring about seeing a specialist. His family has a history of smoking. He denies of smoking. He is currently taking 80 MG of Lipitor.  ?Lab Results  ?Component Value Date  ? CHOL 150 03/21/2021  ? HDL 43.10 03/21/2021  ? [place] 84 03/21/2021  ? LDLDIRECT 97.0 12/27/2015  ? TRIG 115.0 03/21/2021  ? CHOLHDL 3 03/21/2021  ? ? ? ? ?There are no preventive care reminders to display for this patient. ? ?Past Medical History:  ?Diagnosis Date  ? Anxiety   ? Atypical chest pain 08/01/2019  ? Basal cell carcinoma 2006  ? BPH with obstruction/lower urinary tract symptoms 10/19/2014  ? Carpal tunnel syndrome 02/07/2016  ? Chronic rhinitis 07/24/2016  ? Depression   ? Diastolic dysfunction 09/18/8117  ? Per 2D echo 8/16  ? ED (erectile dysfunction) of organic origin 09/16/2014  ? Elevated coronary artery calcium score 02/06/2020  ? Frozen shoulder 2018  ? left  ? GERD (gastroesophageal reflux disease) 12/27/2015   ? Herpes simplex type 1 antibody positive 01/28/2015  ? Herpes simplex type 2 infection 01/28/2015  ? History of chicken pox   ? History of obstructive sleep apnea 10/29/2015  ? HTN (hypertension) 08/05/2014  ? Hyperlipidemia   ? Hypertension   ? Left shoulder pain 02/20/2017  ? Mood disorder ([place]) 08/25/2016  ? Preventative health care 09/16/2014  ? ? ?Past Surgical History:  ?Procedure Laterality Date  ? COLONOSCOPY    ? around [facility] [place]  ? KNEE SURGERY Left 2000  ? meninscus repair  ? SHOULDER ARTHROSCOPY Left 01/2018  ? UPPER GASTROINTESTINAL ENDOSCOPY    ? ? ?Family History  ?Problem Relation Age of Onset  ? Diabetes Mother   ?     type II  ? Anxiety disorder Mother   ? Hypertension Father   ? Alcohol abuse Father   ? Liver cancer Sister   ? Hemachromatosis Sister   ? COPD Sister   ?     died from covid complication  ? Diabetes Mellitus II Sister   ? Hemachromatosis Brother   ? Liver cancer Brother   ? CAD Brother   ?     smoker  ? CAD Brother   ?     smoker  ? Colon cancer Neg Hx   ?  Esophageal cancer Neg Hx   ? Stomach cancer Neg Hx   ? Rectal cancer Neg Hx   ? ? ?Social History  ? ?Socioeconomic History  ? Marital status: Divorced  ?  Spouse name: Not on file  ? Number of children: Not on file  ? Years of education: Not on file  ? Highest education level: Not on file  ?Occupational History  ? Not on file  ?Tobacco Use  ? Smoking status: Never  ? Smokeless tobacco: Never  ?Vaping Use  ? Vaping Use: Never used  ?Substance and Sexual Activity  ? Alcohol use: No  ? Drug use: No  ? Sexual activity: Yes  ?Other Topics Concern  ? Not on file  ?Social History Narrative  ? Separated  ? 1 child (28 yr old daughter)- lives in [place]  ? 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 Completed 10th grade  ? ?Social Determinants of Health  ? ?Financial Resource Strain: Not on file  ?Food Insecurity: Not on file  ?Transportation Needs: Not on file  ?Physical Activity: Not on file  ?Stress: Not on file  ?Social  Connections: Not on file  ?Intimate Partner Violence: Not on file  ? ? ?Outpatient Medications Prior to Visit  ?Medication Sig Dispense Refill  ? amLODipine (NORVASC) 10 MG tablet Take 1 tablet by mouth once daily 90 tablet 3  ? aspirin EC 81 MG tablet Take 1 tablet (81 mg total) by mouth daily. Swallow whole. 90 tablet 3  ? atorvastatin (LIPITOR) 80 MG tablet TAKE 1 TABLET BY MOUTH EVERY DAY 30 tablet 11  ? divalproex (DEPAKOTE) 250 MG DR tablet TAKE 2 TO 3 TABLETS BY MOUTH EVERY EVENING  12  ? fenofibrate (TRICOR) 145 MG tablet TAKE 1 TABLET (145 MG TOTAL) BY MOUTH DAILY. 30 tablet 5  ? hydrOXYzine (VISTARIL) 25 MG capsule Take 1 capsule (25 mg total) by mouth every 8 (eight) hours as needed. 30 capsule 1  ? meloxicam (MOBIC) 7.5 MG tablet TAKE 1 TABLET BY MOUTH ONCE DAILY AS NEEDED FOR PAIN 14 tablet 0  ? Multiple Vitamins-Minerals (MENS MULTIVITAMIN PLUS PO) Take 1 tablet by mouth daily in the afternoon.    ? Omega-3 Fatty Acids (FISH OIL) 1000 MG CAPS Take 2 capsules (2,000 mg total) by mouth 2 (two) times daily. 180 capsule 3  ? pantoprazole (PROTONIX) 20 MG tablet Take 1 tablet (20 mg total) by mouth daily. 90 tablet 3  ? QUEtiapine (SEROQUEL) 400 MG tablet Take 400 mg by mouth at bedtime.    ? valACYclovir (VALTREX) 500 MG tablet TAKE 1 TABLET BY MOUTH EVERY DAY 90 tablet 4  ? valsartan (DIOVAN) 160 MG tablet Take 1 tablet (160 mg total) by mouth daily. 90 tablet 1  ? tadalafil (CIALIS) 5 MG tablet Take 1 tablet (5 mg total) by mouth daily. 90 tablet 1  ? ?No facility-administered medications prior to visit.  ? ? ?No Known Allergies ? ?ROS ?See HPI ?   ?Objective:  ?  ?Physical Exam ?Constitutional:   ?   General: He is not in acute distress. ?   Appearance: Normal appearance. He is not ill-appearing.  ?HENT:  ?   Head: Normocephalic and atraumatic.  ?   Right Ear: External ear normal.  ?   Left Ear: External ear normal.  ?Eyes:  ?   Extraocular Movements: Extraocular movements intact.  ?   Pupils: Pupils  are equal, round, and reactive to light.  ?Cardiovascular:  ?  Rate and Rhythm: Normal rate and regular rhythm.  ?   Heart sounds: Normal heart sounds. No murmur heard. ?  No gallop.  ?Pulmonary:  ?   Effort: Pulmonary effort is normal. No respiratory distress.  ?   Breath sounds: Normal breath sounds. No wheezing or rales.  ?Skin: ?   General: Skin is warm and dry.  ?Neurological:  ?   Mental Status: He is alert and oriented to person, place, and time.  ?Psychiatric:     ?   Mood and Affect: Mood normal.     ?   Behavior: Behavior normal.     ?   Judgment: Judgment normal.  ? ? ?BP 132/80   Pulse 74   Temp 97.8 ?F (36.6 ?C) (Oral)   Ht '5\' 9"'$  (1.753 m)   Wt 201 lb 12.8 oz (91.5 kg)   SpO2 96%   BMI 29.80 kg/m?  ?Wt Readings from Last 3 Encounters:  ?09/23/21 201 lb 12.8 oz (91.5 kg)  ?09/05/21 209 lb (94.8 kg)  ?03/30/21 190 lb (86.2 kg)  ? ? ?   ?Assessment & Plan:  ? ?Problem List Items Addressed This Visit   ? ?  ? Unprioritized  ? HTN (hypertension)  ?  BP Readings from Last 3 Encounters:  ?09/23/21 132/80  ?09/05/21 (!) 146/78  ?03/30/21 140/82  ?Improved and at goal. Will continue increased dose of diovan as well as amlodipine.  ? ?Wt Readings from Last 3 Encounters:  ?09/23/21 201 lb 12.8 oz (91.5 kg)  ?09/05/21 209 lb (94.8 kg)  ?03/30/21 190 lb (86.2 kg)  ? ?  ?  ? Relevant Medications  ? tadalafil (CIALIS) 5 MG tablet  ? Other Relevant Orders  ? Basic metabolic panel  ? Family history of early CAD - Primary  ?  He has seen Cardiology in the past 2021- but did not follow back up.  Will arrange follow up. Discussed primary prevention strategies including continuing statin, regular exercise, weight management, bp management.  Thankfully, he does not smoke.  ? ?  ?  ? Relevant Orders  ? Ambulatory referral to Cardiology  ? ED (erectile dysfunction) of organic origin  ?  Notes that the once daily cialis dosing is not quite as effective as the previous prn dosing.  Recommend that he take an additional '5mg'$   prior to intercourse.  ? ?  ?  ? BPH with obstruction/lower urinary tract symptoms  ?  Reports improved bladder emptying with the once daily cialis dosing. Continue same.  ? ?  ?  ? ? ? ? ?Meds ordered this encou

## 2021-09-23 NOTE — Assessment & Plan Note (Signed)
Notes that the once daily cialis dosing is not quite as effective as the previous prn dosing.  Recommend that he take an additional '5mg'$  prior to intercourse.  ?

## 2021-09-23 NOTE — Patient Instructions (Signed)
Please complete lab work prior to leaving.   

## 2021-09-23 NOTE — Assessment & Plan Note (Signed)
He has seen Cardiology in the past 2021- but did not follow back up.  Will arrange follow up. Discussed primary prevention strategies including continuing statin, regular exercise, weight management, bp management.  Thankfully, he does not smoke.  ?

## 2021-09-23 NOTE — Assessment & Plan Note (Signed)
Reports improved bladder emptying with the once daily cialis dosing. Continue same.  ?

## 2021-10-18 DIAGNOSIS — N401 Enlarged prostate with lower urinary tract symptoms: Secondary | ICD-10-CM | POA: Diagnosis not present

## 2021-10-18 DIAGNOSIS — N138 Other obstructive and reflux uropathy: Secondary | ICD-10-CM | POA: Diagnosis not present

## 2021-10-26 ENCOUNTER — Encounter: Payer: Self-pay | Admitting: *Deleted

## 2021-10-27 ENCOUNTER — Ambulatory Visit: Payer: BC Managed Care – PPO | Admitting: Cardiovascular Disease

## 2021-10-27 DIAGNOSIS — N138 Other obstructive and reflux uropathy: Secondary | ICD-10-CM | POA: Diagnosis not present

## 2021-10-27 DIAGNOSIS — N401 Enlarged prostate with lower urinary tract symptoms: Secondary | ICD-10-CM | POA: Diagnosis not present

## 2021-10-27 DIAGNOSIS — N529 Male erectile dysfunction, unspecified: Secondary | ICD-10-CM | POA: Diagnosis not present

## 2021-10-28 ENCOUNTER — Encounter: Payer: Self-pay | Admitting: Nurse Practitioner

## 2021-10-28 ENCOUNTER — Ambulatory Visit: Payer: BC Managed Care – PPO | Admitting: Nurse Practitioner

## 2021-10-28 VITALS — BP 150/80 | HR 70 | Ht 71.0 in | Wt 203.0 lb

## 2021-10-28 DIAGNOSIS — K2100 Gastro-esophageal reflux disease with esophagitis, without bleeding: Secondary | ICD-10-CM

## 2021-10-28 NOTE — Patient Instructions (Signed)
If you are age 61 or older, your body mass index should be between 23-30. Your Body mass index is 28.31 kg/m. If this is out of the aforementioned range listed, please consider follow up with your Primary Care Provider.  If you are age 90 or younger, your body mass index should be between 19-25. Your Body mass index is 28.31 kg/m. If this is out of the aformentioned range listed, please consider follow up with your Primary Care Provider.   ________________________________________________________  The [facility] providers would like to encourage you to use [facility] to communicate with providers for non-urgent requests or questions.  Due to long hold times on the telephone, sending your provider a message by [facility] may be a faster and more efficient way to get a response.  Please allow 48 business hours for a response.  Please remember that this is for non-urgent requests.  _______________________________________________________  Continue Pantoprazole.  Please follow up in one year

## 2021-10-28 NOTE — Progress Notes (Signed)
Assessment   Patient Profile:  Francisco Lopez is a 61 y.o. male known to Dr. Bryan Lopez with a past medical history of HTN, HLD, mood disorder, depression, basal cell carcinoma, BPH, GERD with severe erosive esophagitis,  HSV.  Additional medical history as listed in [place] .  GERD with severe erosive esophagitis After high-dose PPI the erosive esophagitis had resolved on last EGD June 2022.  For the most part his GERD symptoms are under control with only 20 mg of pantoprazole daily .  He has had a few episodes of nocturnal regurgitation over the last year.    Plan  Discussed anti-reflux measures such as avoidance of late meals / bedtime snacks, HOB elevation (or use of wedge pillow), weight reduction ( if applicable)  / maintaining a healthy BMI ( body mass index),  and avoidance of trigger foods and caffeine.  Continue Pantoprazole 20 mg before breakfast Follow-up in 1 year, sooner if needed  HPI   Chief Complaint : Follow-up on GERD   Francisco Lopez h as been following with Dr Francisco Lopez for GERD with severe reflux esophagitis. He had an EGD with empiric dilation in March 2022.  EGD at that time remarkable for gastritis and severe erosive esophagitis.  He was treated with high-dose PPI and antireflux measures .  Follow-up EGD in June 2022 showed esophagitis had resolved and there was near resolution of gastritis. Dose of Pantoprazole was reduced to 40 mg a day.  He was seen in office October 2022, was still doing well and pantoprazole was further decreased to 20 mg daily.    His colonoscopy in March 2022 was normal.  There was some erythema at ICV but biopsies negative.  10-year follow-up colonoscopy recommended  Interval history :  Overall doing well on once daily PPI before breakfast. Over the last year he has had only two-three episodes of nocturnal regurgitation.     Past Medical History:  Diagnosis Date   Anxiety    Atypical chest pain 08/01/2019   Basal cell carcinoma 2006   BPH  with obstruction/lower urinary tract symptoms 10/19/2014   Carpal tunnel syndrome 02/07/2016   Chronic rhinitis 07/24/2016   Depression    Diastolic dysfunction 55/73/2202   Per 2D echo 8/16   ED (erectile dysfunction) of organic origin 09/16/2014   Elevated coronary artery calcium score 02/06/2020   Frozen shoulder 2018   left   Gastritis    GERD (gastroesophageal reflux disease) 12/27/2015   Herpes simplex type 1 antibody positive 01/28/2015   Herpes simplex type 2 infection 01/28/2015   History of chicken pox    History of obstructive sleep apnea 10/29/2015   HTN (hypertension) 08/05/2014   Hyperlipidemia    Hypertension    Left shoulder pain 02/20/2017   Mood disorder ([place]) 08/25/2016   Preventative health care 09/16/2014    Past Surgical History:  Procedure Laterality Date   COLONOSCOPY     around 2015 [facility] in [place] Left 2000   meninscus repair   SHOULDER ARTHROSCOPY Left 01/2018   UPPER GASTROINTESTINAL ENDOSCOPY      Current Medications, Allergies, Family History and Social History were reviewed in [place] record.     Current Outpatient Medications  Medication Sig Dispense Refill   amLODipine (NORVASC) 10 MG tablet Take 1 tablet by mouth once daily 90 tablet 3   aspirin EC 81 MG tablet Take 1 tablet (81 mg total) by mouth daily. Swallow whole. 90 tablet 3  atorvastatin (LIPITOR) 80 MG tablet TAKE 1 TABLET BY MOUTH EVERY DAY 30 tablet 11   divalproex (DEPAKOTE) 250 MG DR tablet TAKE 2 TO 3 TABLETS BY MOUTH EVERY EVENING  12   fenofibrate (TRICOR) 145 MG tablet TAKE 1 TABLET (145 MG TOTAL) BY MOUTH DAILY. 30 tablet 5   hydrOXYzine (VISTARIL) 25 MG capsule Take 1 capsule (25 mg total) by mouth every 8 (eight) hours as needed. 30 capsule 1   meloxicam (MOBIC) 7.5 MG tablet TAKE 1 TABLET BY MOUTH ONCE DAILY AS NEEDED FOR PAIN 14 tablet 0   Multiple Vitamins-Minerals (MENS MULTIVITAMIN PLUS PO) Take 1 tablet by  mouth daily in the afternoon.     Omega-3 Fatty Acids (FISH OIL) 1000 MG CAPS Take 2 capsules (2,000 mg total) by mouth 2 (two) times daily. 180 capsule 3   pantoprazole (PROTONIX) 20 MG tablet Take 1 tablet (20 mg total) by mouth daily. 90 tablet 3   QUEtiapine (SEROQUEL) 400 MG tablet Take 400 mg by mouth at bedtime.     tadalafil (CIALIS) 5 MG tablet Take one tablet once daily.  If needed may take 5 mg additional dose prior to intercourse 60 tablet 1   valACYclovir (VALTREX) 500 MG tablet TAKE 1 TABLET BY MOUTH EVERY DAY 90 tablet 4   valsartan (DIOVAN) 160 MG tablet Take 1 tablet (160 mg total) by mouth daily. 90 tablet 1   No current facility-administered medications for this visit.    Review of Systems: No chest pain. No shortness of breath. No urinary complaints.    Physical Exam  Wt Readings from Last 3 Encounters:  10/28/21 203 lb (92.1 kg)  09/23/21 201 lb 12.8 oz (91.5 kg)  09/05/21 209 lb (94.8 kg)    BP (!) 150/80   Pulse 70   Ht '5\' 11"'$  (1.803 m)   Wt 203 lb (92.1 kg)   BMI 28.31 kg/m  Constitutional:  Generally well appearing male in no acute distress. Psychiatric: Pleasant. Normal mood and affect. Behavior is normal. EENT: Pupils normal.  Conjunctivae are normal. No scleral icterus. Neck supple.  Cardiovascular: Normal rate, regular rhythm. No edema Pulmonary/chest: Effort normal and breath sounds normal. No wheezing, rales or rhonchi. Abdominal: Soft, nondistended, nontender. Bowel sounds active throughout. There are no masses palpable. No hepatomegaly. Neurological: Alert and oriented to person place and time. Skin: Skin is warm and dry. No rashes noted.  Francisco Savoy, NP  10/28/2021, 10:23 AM

## 2021-10-31 NOTE — Progress Notes (Signed)
Agree with the assessment and plan as outlined by Paula Guenther, NP. ° °Sherae Santino, DO, FACG ° °

## 2021-12-14 ENCOUNTER — Ambulatory Visit: Payer: BC Managed Care – PPO | Admitting: Cardiology

## 2022-01-20 ENCOUNTER — Ambulatory Visit: Payer: BC Managed Care – PPO | Admitting: Cardiology

## 2022-01-20 ENCOUNTER — Encounter: Payer: Self-pay | Admitting: Cardiology

## 2022-01-20 VITALS — BP 150/88 | Ht 71.0 in | Wt 204.0 lb

## 2022-01-20 DIAGNOSIS — E782 Mixed hyperlipidemia: Secondary | ICD-10-CM | POA: Diagnosis not present

## 2022-01-20 DIAGNOSIS — I10 Essential (primary) hypertension: Secondary | ICD-10-CM | POA: Diagnosis not present

## 2022-01-20 DIAGNOSIS — R931 Abnormal findings on diagnostic imaging of heart and coronary circulation: Secondary | ICD-10-CM

## 2022-01-20 DIAGNOSIS — R072 Precordial pain: Secondary | ICD-10-CM

## 2022-01-20 MED ORDER — METOPROLOL SUCCINATE ER 25 MG PO TB24: 25.0000 mg | ORAL_TABLET | Freq: Every day | ORAL | 3 refills | Status: DC

## 2022-01-20 MED ORDER — METOPROLOL TARTRATE 100 MG PO TABS: 100.0000 mg | ORAL_TABLET | Freq: Once | ORAL | 0 refills | Status: DC

## 2022-01-20 MED ORDER — EZETIMIBE 10 MG PO TABS: 10.0000 mg | ORAL_TABLET | Freq: Every day | ORAL | 3 refills | Status: DC

## 2022-01-20 NOTE — Addendum Note (Signed)
Addended by: Edwyna Shell I on: 01/20/2022 05:26 PM   Modules accepted: Orders

## 2022-01-20 NOTE — Progress Notes (Signed)
Cardiology Office Note:    Date:  01/20/2022   ID:  Francisco Lopez, DOB [birth year], MRN [id number]  PCP:  [name], NP  Cardiologist:  Shirlee More, MD    Referring MD: [name], NP    ASSESSMENT:    1. Elevated coronary artery calcium score   2. Primary hypertension   3. Mixed hyperlipidemia    PLAN:    In order of problems listed above:  He has a coronary equivalent coronary calcium score greater than 300 significant probability of obstructive CAD and is having a chronic chest pain pattern.  I agree that he is well served with coronary CTA he agrees and will be set up as an outpatient if he has flow-limiting stenosis high risk markers would benefit from coronary angiography and revascularization.  He will continue aspirin intensify lipid-lowering by adding Zetia to his statin.  Also check LP(a) Continue his current treatment if blood pressure remains above target would benefit from a second agent either beta-blocker or calcium channel blocker Add Zetia   Next appointment: 2 months   Medication Adjustments/Labs and Tests Ordered: Current medicines are reviewed at length with the patient today.  Concerns regarding medicines are outlined above.  No orders of the defined types were placed in this encounter.  No orders of the defined types were placed in this encounter.  Chief complaint I am quite concerned of heart disease   History of Present Illness:    Francisco Lopez is a 61 y.o. male with a hx of elevated coronary artery calcium score hypertension hyperlipidemia last seen 04/20/2020.  Coronary CT calcium score 01/19/2020 391/89th percentile.  Compliance with diet, lifestyle and medications: Yes  He has a strong family history of premature CAD mother had bypass surgery and died 2 years later at age 52 father died at 46 for what sounds like thoracic aorta or left ventricular aneurysm with rupture he had a brother who had PCI and stent in his early  38s. 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 At times he gets a vague discomfort in the chest and even goes into his left shoulder its not severe or limiting its not exertional but is relieved with rest but has become a recurrent problem and he is concerned that he is having anginal symptoms. Its not positional not pleuritic he has had no chest wall injury no associated diaphoresis nausea or vomiting.  The episodes last a few minutes and resolve spontaneously Past Medical History:  Diagnosis Date   Anxiety    Atypical chest pain 08/01/2019   Basal cell carcinoma 2006   BPH with obstruction/lower urinary tract symptoms 10/19/2014   Carpal tunnel syndrome 02/07/2016   Chronic rhinitis 07/24/2016   Depression    Diastolic dysfunction 20/94/7096   Per 2D echo 8/16   ED (erectile dysfunction) of organic origin 09/16/2014   Elevated coronary artery calcium score 02/06/2020   Frozen shoulder 2018   left   Gastritis    GERD (gastroesophageal reflux disease) 12/27/2015   Herpes simplex type 1 antibody positive 01/28/2015   Herpes simplex type 2 infection 01/28/2015   History of chicken pox    History of obstructive sleep apnea 10/29/2015   HTN (hypertension) 08/05/2014   Hyperlipidemia    Hypertension    Left shoulder pain 02/20/2017   Mood disorder ([place]) 08/25/2016   Preventative  health care 09/16/2014    Past Surgical History:  Procedure Laterality Date   COLONOSCOPY     around 2015 [facility] [place] Left 2000   meninscus repair   SHOULDER ARTHROSCOPY Left 01/2018   UPPER GASTROINTESTINAL ENDOSCOPY      Current Medications: No outpatient medications have been marked as taking for the 01/20/22 encounter (Office Visit) with Richardo Priest, MD.     Allergies:   Patient has no known allergies.   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[employer], 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   Social Determinants of Health   Financial Resource Strain: Not on file  Food Insecurity: Not on file  Transportation Needs: Not on file  Physical Activity: Not on file  Stress: Not on file  Social Connections: Not on file     Family History: The patient's family history includes Alcohol abuse in his father; Anxiety disorder in his mother; CAD in his brother and brother; COPD in his sister; Diabetes in his mother; Diabetes Mellitus II in his sister; Hemachromatosis in his brother and sister; Hypertension in his father; Liver cancer in his brother and sister. There is no history of Colon cancer, Esophageal cancer, Stomach cancer, or Rectal cancer. ROS:   Please see the history of present illness.    All other systems reviewed and are negative.  EKGs/Labs/Other Studies Reviewed:    The following studies were reviewed today:  EKG:  EKG ordered today and personally reviewed.  The ekg ordered today demonstrates sinus rhythm normal EKG  Recent Labs: 03/21/2021: ALT 20 09/23/2021: BUN 17; Creatinine, Ser 0.93; Potassium 4.2; Sodium 139  Recent Lipid Panel    Component Value Date/Time   CHOL 150 03/21/2021 1019   CHOL 189 04/28/2020 0806   TRIG 115.0 03/21/2021 1019   HDL 43.10 03/21/2021 1019   HDL 52 04/28/2020 0806   CHOLHDL 3 03/21/2021 1019   VLDL 23.0 03/21/2021 1019    LDLCALC 84 03/21/2021 1019   LDLCALC 122 (H) 04/28/2020 0806   LDLDIRECT 97.0 12/27/2015 1651    Physical Exam:    VS:  BP (!) 150/88 (BP Location: Left Arm, Patient Position: Sitting, Cuff Size: Normal)   Ht '5\' 11"'$  (1.803 m)   Wt 204 lb (92.5 kg)   BMI 28.45 kg/m     Wt Readings from Last 3 Encounters:  01/20/22 204 lb (92.5 kg)  10/28/21 203 lb (92.1 kg)  09/23/21 201 lb 12.8 oz (91.5 kg)     GEN: Looks healthy no arcus senilis xanthoma or xanthelasma well nourished, well developed in no acute distress HEENT: Normal NECK: No JVD; No carotid bruits LYMPHATICS: No lymphadenopathy CARDIAC: RRR, no murmurs, rubs, gallops RESPIRATORY:  Clear to auscultation without rales, wheezing or rhonchi  ABDOMEN: Soft,  non-tender, non-distended MUSCULOSKELETAL:  No edema; No deformity  SKIN: Warm and dry NEUROLOGIC:  Alert and oriented x 3 PSYCHIATRIC:  Normal affect    Signed, Shirlee More, MD  01/20/2022 2:16 PM    [facility]

## 2022-01-20 NOTE — Patient Instructions (Signed)
Medication Instructions:  Your physician has recommended you make the following change in your medication:   START: Zetia 10 mg daily START: Toprol XL 25 mg daily  *If you need a refill on your cardiac medications before your next appointment, please call your pharmacy*   Lab Work: Your physician recommends that you return for lab work in:   Labs today: BMP, Lipids, Lpa Labs 1 week before CT  If you have labs (blood work) drawn today and your tests are completely normal, you will receive your results only by: [facility] (if you have MyChart) OR A paper copy in the mail If you have any lab test that is abnormal or we need to change your treatment, we will call you to review the results.   Testing/Procedures:   Your cardiac CT will be scheduled at one of the below locations:   [facility] [address] [place], [place] [address] [phone]  [facility] [address] [place], [place] [address] [phone]  If scheduled at [facility], please arrive at the [facility] and Children's Entrance (Entrance C2) of [facility] 30 minutes prior to test start time. You can use the FREE valet parking offered at entrance C (encouraged to control the heart rate for the test)  Proceed to the [facility] Radiology Department (first floor) to check-in and test prep.  All radiology patients and guests should use entrance C2 at [facility], accessed from [facility], even though the hospital's physical address listed is [address]    If scheduled at [facility], please arrive 15 mins early for check-in and test prep.  Please follow these instructions carefully (unless otherwise directed):  Hold all erectile dysfunction medications at least 3 days (72 hrs) prior to test.  On the Night Before the Test: Be sure to Drink plenty of water. Do not  consume any caffeinated/decaffeinated beverages or chocolate 12 hours prior to your test. Do not take any antihistamines 12 hours prior to your test.  On the Day of the Test: Drink plenty of water until 1 hour prior to the test. Do not eat any food 4 hours prior to the test. You may take your regular medications prior to the test.  Take metoprolol (Lopressor) two hours prior to test.      After the Test: Drink plenty of water. After receiving IV contrast, you may experience a mild flushed feeling. This is normal. On occasion, you may experience a mild rash up to 24 hours after the test. This is not dangerous. If this occurs, you can take Benadryl 25 mg and increase your fluid intake. If you experience trouble breathing, this can be serious. If it is severe call 911 IMMEDIATELY. If it is mild, please call our office. If you take any of these medications: Glipizide/Metformin, Avandament, Glucavance, please do not take 48 hours after completing test unless otherwise instructed.  We will call to schedule your test 2-4 weeks out understanding that some insurance companies will need an authorization prior to the service being performed.   For non-scheduling related questions, please contact the cardiac imaging nurse navigator should you have any questions/concerns: Marchia Bond, Cardiac Imaging Nurse Navigator Gordy Clement, Cardiac Imaging Nurse Navigator [facility] Direct Office Dial: [phone]   For scheduling needs, including cancellations and rescheduling, please call [place], [phone].    Follow-Up: At [facility], you and your  health needs are our priority.  As part of our continuing mission to provide you with exceptional heart care, we have created designated Provider Care Teams.  These Care Teams include your primary Cardiologist (physician) and Advanced Practice Providers (APPs -  Physician Assistants and Nurse Practitioners) who all work  together to provide you with the care you need, when you need it.  We recommend signing up for the patient portal called "MyChart".  Sign up information is provided on this After Visit Summary.  MyChart is used to connect with patients for Virtual Visits (Telemedicine).  Patients are able to view lab/test results, encounter notes, upcoming appointments, etc.  Non-urgent messages can be sent to your provider as well.   To learn more about what you can do with MyChart, go to [email or web address].    Your next appointment:   2 month(s)  The format for your next appointment:   In Person  Provider:   Shirlee More, MD    Other Instructions None  Important Information About [place]

## 2022-01-23 DIAGNOSIS — R931 Abnormal findings on diagnostic imaging of heart and coronary circulation: Secondary | ICD-10-CM | POA: Diagnosis not present

## 2022-01-23 DIAGNOSIS — R072 Precordial pain: Secondary | ICD-10-CM | POA: Diagnosis not present

## 2022-01-23 DIAGNOSIS — I10 Essential (primary) hypertension: Secondary | ICD-10-CM | POA: Diagnosis not present

## 2022-01-23 DIAGNOSIS — E782 Mixed hyperlipidemia: Secondary | ICD-10-CM | POA: Diagnosis not present

## 2022-01-24 LAB — LIPID PANEL
Chol/HDL Ratio: 4.9 ratio (ref 0.0–5.0)
Cholesterol, Total: 187 mg/dL (ref 100–199)
HDL: 38 mg/dL — ABNORMAL LOW (ref >39)
LDL Chol Calc (NIH): 102 mg/dL — ABNORMAL HIGH (ref 0–99)
Triglycerides: 274 mg/dL — ABNORMAL HIGH (ref 0–149)
VLDL Cholesterol Cal: 47 mg/dL — ABNORMAL HIGH (ref 5–40)

## 2022-01-24 LAB — BASIC METABOLIC PANEL
BUN/Creatinine Ratio: 20 (ref 10–24)
BUN: 18 mg/dL (ref 8–27)
CO2: 19 mmol/L — ABNORMAL LOW (ref 20–29)
Calcium: 9.6 mg/dL (ref 8.6–10.2)
Chloride: 104 mmol/L (ref 96–106)
Creatinine, Ser: 0.91 mg/dL (ref 0.76–1.27)
Glucose: 76 mg/dL (ref 70–99)
Potassium: 4.1 mmol/L (ref 3.5–5.2)
Sodium: 140 mmol/L (ref 134–144)
eGFR: 96 mL/min/{1.73_m2} (ref >59)

## 2022-01-24 LAB — LIPOPROTEIN A (LPA): Lipoprotein (a): 105.8 nmol/L — ABNORMAL HIGH (ref <75.0)

## 2022-01-27 DIAGNOSIS — N529 Male erectile dysfunction, unspecified: Secondary | ICD-10-CM | POA: Diagnosis not present

## 2022-01-27 DIAGNOSIS — N401 Enlarged prostate with lower urinary tract symptoms: Secondary | ICD-10-CM | POA: Diagnosis not present

## 2022-01-27 DIAGNOSIS — N138 Other obstructive and reflux uropathy: Secondary | ICD-10-CM | POA: Diagnosis not present

## 2022-02-02 DIAGNOSIS — F4322 Adjustment disorder with anxiety: Secondary | ICD-10-CM | POA: Diagnosis not present

## 2022-02-02 DIAGNOSIS — F4312 Post-traumatic stress disorder, chronic: Secondary | ICD-10-CM | POA: Diagnosis not present

## 2022-02-17 ENCOUNTER — Other Ambulatory Visit (HOSPITAL_COMMUNITY): Payer: BC Managed Care – PPO

## 2022-02-17 ENCOUNTER — Telehealth (HOSPITAL_COMMUNITY): Payer: Self-pay | Admitting: *Deleted

## 2022-02-17 NOTE — Telephone Encounter (Signed)
Patient returning call regarding upcoming cardiac imaging study; pt verbalizes understanding of appt date/time, parking situation and where to check in, medications ordered, and verified current allergies; name and call back number provided for further questions should they arise  Francisco Clement RN Navigator Cardiac Imaging [facility] [phone] office [phone] cell  Patient to take '100mg'$  metoprolol tartrate two hours prior to his cardiac CT scan. He is aware to arrive at 8am.

## 2022-02-17 NOTE — Telephone Encounter (Signed)
Attempted to call patient regarding upcoming cardiac CT appointment. °Left message on voicemail with name and callback number ° °Shekira Drummer RN Navigator Cardiac Imaging °[facility] °[phone] Office °[phone] Cell ° °

## 2022-02-20 ENCOUNTER — Ambulatory Visit (HOSPITAL_COMMUNITY)
Admission: RE | Admit: 2022-02-20 | Discharge: 2022-02-20 | Disposition: A | Discharge status: Home / Self Care | Payer: BC Managed Care – PPO | Source: Ambulatory Visit | Attending: Cardiology | Admitting: Cardiology

## 2022-02-20 DIAGNOSIS — I2510 Atherosclerotic heart disease of native coronary artery without angina pectoris: Secondary | ICD-10-CM | POA: Diagnosis not present

## 2022-02-20 DIAGNOSIS — R072 Precordial pain: Secondary | ICD-10-CM | POA: Insufficient documentation

## 2022-02-20 DIAGNOSIS — R931 Abnormal findings on diagnostic imaging of heart and coronary circulation: Secondary | ICD-10-CM | POA: Diagnosis not present

## 2022-02-20 MED ORDER — NITROGLYCERIN 0.4 MG SL SUBL
SUBLINGUAL_TABLET | SUBLINGUAL | Status: AC
  Filled 2022-02-20: qty 2

## 2022-02-20 MED ORDER — NITROGLYCERIN 0.4 MG SL SUBL
0.8000 mg | SUBLINGUAL_TABLET | Freq: Once | SUBLINGUAL | Status: AC
  Administered 2022-02-20: 0.8 mg via SUBLINGUAL

## 2022-02-20 MED ORDER — IOHEXOL 350 MG/ML SOLN
100.0000 mL | Freq: Once | INTRAVENOUS | Status: AC | PRN
  Administered 2022-02-20: 100 mL via INTRAVENOUS

## 2022-02-21 ENCOUNTER — Ambulatory Visit (HOSPITAL_COMMUNITY)
Admission: RE | Admit: 2022-02-21 | Discharge: 2022-02-21 | Disposition: A | Discharge status: Home / Self Care | Payer: BC Managed Care – PPO | Source: Ambulatory Visit | Attending: Cardiology | Admitting: Cardiology

## 2022-02-21 ENCOUNTER — Other Ambulatory Visit: Payer: Self-pay | Admitting: Cardiology

## 2022-02-21 DIAGNOSIS — R931 Abnormal findings on diagnostic imaging of heart and coronary circulation: Secondary | ICD-10-CM | POA: Diagnosis not present

## 2022-02-21 DIAGNOSIS — I2510 Atherosclerotic heart disease of native coronary artery without angina pectoris: Secondary | ICD-10-CM | POA: Diagnosis not present

## 2022-02-21 NOTE — Progress Notes (Unsigned)
FFR Order

## 2022-02-26 ENCOUNTER — Other Ambulatory Visit: Payer: Self-pay | Admitting: Family

## 2022-03-09 ENCOUNTER — Other Ambulatory Visit (HOSPITAL_COMMUNITY): Payer: BC Managed Care – PPO

## 2022-03-15 ENCOUNTER — Telehealth: Payer: Self-pay | Admitting: Cardiology

## 2022-03-15 NOTE — Telephone Encounter (Signed)
Patient was returning call for results. Please advise °

## 2022-03-15 NOTE — Telephone Encounter (Signed)
Left message for the patient to call back.

## 2022-03-16 NOTE — Telephone Encounter (Signed)
Results reviewed with pt as per Dr. Joya Gaskins note.  Pt verbalized understanding and had no additional questions. Routed to PCP  Pt has an appt.

## 2022-03-16 NOTE — Telephone Encounter (Signed)
Patient returned RN's call. 

## 2022-03-27 ENCOUNTER — Ambulatory Visit: Payer: BC Managed Care – PPO | Admitting: Family

## 2022-03-29 ENCOUNTER — Ambulatory Visit: Payer: BC Managed Care – PPO | Admitting: Cardiology

## 2022-03-31 ENCOUNTER — Encounter: Payer: Self-pay | Admitting: Family

## 2022-03-31 ENCOUNTER — Telehealth: Payer: Self-pay

## 2022-03-31 ENCOUNTER — Ambulatory Visit: Payer: BC Managed Care – PPO | Admitting: Family

## 2022-03-31 VITALS — BP 128/75 | HR 67 | Temp 98.7°F | Resp 16 | Wt 202.0 lb

## 2022-03-31 DIAGNOSIS — I10 Essential (primary) hypertension: Secondary | ICD-10-CM | POA: Diagnosis not present

## 2022-03-31 DIAGNOSIS — N401 Enlarged prostate with lower urinary tract symptoms: Secondary | ICD-10-CM

## 2022-03-31 DIAGNOSIS — N138 Other obstructive and reflux uropathy: Secondary | ICD-10-CM

## 2022-03-31 DIAGNOSIS — N529 Male erectile dysfunction, unspecified: Secondary | ICD-10-CM

## 2022-03-31 DIAGNOSIS — M19041 Primary osteoarthritis, right hand: Secondary | ICD-10-CM

## 2022-03-31 DIAGNOSIS — K2100 Gastro-esophageal reflux disease with esophagitis, without bleeding: Secondary | ICD-10-CM | POA: Diagnosis not present

## 2022-03-31 DIAGNOSIS — E785 Hyperlipidemia, unspecified: Secondary | ICD-10-CM

## 2022-03-31 DIAGNOSIS — F32A Depression, unspecified: Secondary | ICD-10-CM | POA: Diagnosis not present

## 2022-03-31 DIAGNOSIS — B009 Herpesviral infection, unspecified: Secondary | ICD-10-CM

## 2022-03-31 HISTORY — DX: Primary osteoarthritis, right hand: M19.041

## 2022-03-31 NOTE — Assessment & Plan Note (Signed)
No outbreaks on daily valtrex, continue same.

## 2022-03-31 NOTE — Assessment & Plan Note (Signed)
Stable on serquel and depakote Management per psychiatry, Dr. Toy Care.

## 2022-03-31 NOTE — Progress Notes (Signed)
Subjective:   By signing my name below, I, [name], attest that this documentation has been prepared under the direction and in the presence of Francisco Chimera, NP 03/31/2022   Patient ID: Francisco Lopez, male    DOB: 09-21-1960, 61 y.o.   MRN: [id number]  Chief Complaint  Patient presents with   Hypertension    Here for follow up   Hand Pain    Complains of pain on right hand    HPI Patient is in today for an office visit  Hand Pain: He complains of right hand pain. He describes of aching symptoms as well as joint pain. He states that it hurts most days. When he takes 7.5 Mg of Meloxicam, he does not notice an improvement in symptoms  Blood Pressure: His blood pressure is remaining elevated. He is currently taking 25 mg of Metoprolol Succinate, 100 Mg of Metoprolol Tartrate, 160 Mg of Valsartan and 10 Mg of Amlodipine. His blood pressure reading is 128/75 BP Readings from Last 3 Encounters:  03/31/22 128/75  02/20/22 114/68  01/20/22 (!) 150/88   Pulse Readings from Last 3 Encounters:  03/31/22 67  02/20/22 (!) 53  10/28/21 70   Cholesterol: His triglyceride levels are slightly elevating. He is currently taking 80 Mg of Lipitor and 145 Mg of Fenofibrate. He also takes [employer]  Lab Results  Component Value Date   CHOL 187 01/23/2022   HDL 38 (L) 01/23/2022   LDLCALC 102 (H) 01/23/2022   LDLDIRECT 97.0 12/27/2015   TRIG 274 (H) 01/23/2022   CHOLHDL 4.9 01/23/2022   Reflux: He reports that his symptoms are improving. He uses his pillow to lay a certain way which is alleviating his symptoms. He is currently taking 20 Mg of Protonix  Mood: He reports that his mood is stable.   Anxiety: He is currently 25 Mg of Hydroxyzine every 8 hours PRN   Valtrex: He reports that symptoms are controlled. He is currently taking 500 Mg of Valtrex   Urinary Symptoms: He is currently taking 5 Mg of Cialis and he occasionally takes an extra dose for ED.    Immunizations: He reports that he is UTD on the influenza vaccine.   There are no preventive care reminders to display for this patient.   Past Medical History:  Diagnosis Date   Anxiety    Atypical chest pain 08/01/2019   Basal cell carcinoma 2006   BPH with obstruction/lower urinary tract symptoms 10/19/2014   Carpal tunnel syndrome 02/07/2016   Chronic rhinitis 07/24/2016   Depression    Diastolic dysfunction 40/03/2724   Per 2D echo 8/16   ED (erectile dysfunction) of organic origin 09/16/2014   Elevated coronary artery calcium score 02/06/2020   Frozen shoulder 2018   left   Gastritis    GERD (gastroesophageal reflux disease) 12/27/2015   Herpes simplex type 1 antibody positive 01/28/2015   Herpes simplex type 2 infection 01/28/2015   History of chicken pox    History of obstructive sleep apnea 10/29/2015   HTN (hypertension) 08/05/2014   Hyperlipidemia    Hypertension    Left shoulder pain 02/20/2017   Mood disorder ([place]) 08/25/2016   Preventative health care 09/16/2014    Past Surgical History:  Procedure Laterality Date   COLONOSCOPY     around 2015 [facility] [place] Left 2000   meninscus repair   SHOULDER ARTHROSCOPY Left 01/2018   UPPER GASTROINTESTINAL ENDOSCOPY  Family History  Problem Relation Age of Onset   Diabetes Mother        type II   Anxiety disorder Mother    Hypertension Father    Alcohol abuse Father    Liver cancer Sister    Hemachromatosis Sister    COPD Sister        died from covid complication   Diabetes Mellitus II Sister    Hemachromatosis Brother    Liver cancer Brother    CAD Brother        smoker   CAD Brother        smoker   Colon cancer Neg Hx    Esophageal cancer Neg Hx    Stomach cancer Neg Hx    Rectal cancer Neg Hx     Social History   Socioeconomic History   Marital status: Divorced    Spouse name: Not on file   Number of children: Not on file   Years of education: Not on  file   Highest education level: Not on file  Occupational History   Not on file  Tobacco Use   Smoking status: Never   Smokeless tobacco: Never  Vaping Use   Vaping Use: Never used  Substance and Sexual Activity   Alcohol use: No   Drug use: No   Sexual activity: Yes  Other Topics Concern   Not on file  Social History Narrative   Separated   1 child (89 yr old daughter)- lives in [place] 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 Swallow whole. 90 tablet 3   atorvastatin (LIPITOR) 80 MG tablet TAKE 1 TABLET BY MOUTH EVERY DAY 30 tablet 11   divalproex (DEPAKOTE) 250 MG DR tablet TAKE 2 TO 3 TABLETS BY MOUTH EVERY EVENING  12   ezetimibe (ZETIA) 10 MG tablet Take 1 tablet (10 mg total) by mouth daily. 90 tablet 3   fenofibrate (TRICOR) 145 MG tablet TAKE 1 TABLET (145 MG TOTAL) BY MOUTH DAILY. 30 tablet 5   hydrOXYzine (VISTARIL) 25 MG capsule Take 1 capsule (25 mg total) by mouth every 8 (eight) hours as needed. 30 capsule 1   meloxicam (MOBIC) 7.5 MG tablet TAKE 1 TABLET BY MOUTH ONCE DAILY AS NEEDED FOR PAIN 14 tablet 0   metoprolol succinate (TOPROL XL) 25 MG 24 hr tablet Take 1 tablet (25 mg total) by mouth daily. 90 tablet 3   Multiple Vitamins-Minerals (MENS MULTIVITAMIN PLUS PO) Take 1 tablet by mouth daily in the afternoon.     Omega-3 Fatty Acids (FISH OIL) 1000 MG CAPS Take 2 capsules (2,000 mg total) by mouth 2 (two) times daily. 180 capsule 3   pantoprazole  (PROTONIX) 20 MG tablet Take 1 tablet (20 mg total) by mouth daily. 90 tablet 3   QUEtiapine (SEROQUEL) 400 MG tablet Take 400 mg by mouth at bedtime.     tadalafil (CIALIS) 5  MG tablet Take one tablet once daily.  If needed may take 5 mg additional dose prior to intercourse 60 tablet 1   valACYclovir (VALTREX) 500 MG tablet TAKE 1 TABLET BY MOUTH EVERY DAY 90 tablet 4   valsartan (DIOVAN) 160 MG tablet TAKE 1 TABLET BY MOUTH EVERY DAY 90 tablet 1   metoprolol tartrate (LOPRESSOR) 100 MG tablet Take 1 tablet (100 mg total) by mouth once for 1 dose. Please take 2 hours prior to CT 1 tablet 0   No facility-administered medications prior to visit.    No Known Allergies  Review of Systems  Musculoskeletal:  Positive for myalgias (Right Hand).       Objective:    Physical Exam Constitutional:      General: He is not in acute distress.    Appearance: Normal appearance. He is not ill-appearing.  HENT:     Head: Normocephalic and atraumatic.     Right Ear: External ear normal.     Left Ear: External ear normal.  Eyes:     Extraocular Movements: Extraocular movements intact.     Pupils: Pupils are equal, round, and reactive to light.  Cardiovascular:     Rate and Rhythm: Normal rate and regular rhythm.     Heart sounds: Normal heart sounds. No murmur heard.    No gallop.  Pulmonary:     Effort: Pulmonary effort is normal. No respiratory distress.     Breath sounds: Normal breath sounds. No wheezing or rales.  Skin:    General: Skin is warm and dry.  Neurological:     Mental Status: He is alert and oriented to person, place, and time.  Psychiatric:        Mood and Affect: Mood normal.        Behavior: Behavior normal.        Judgment: Judgment normal.     BP 128/75   Pulse 67   Temp 98.7 F (37.1 C) (Oral)   Resp 16   Wt 202 lb (91.6 kg)   SpO2 97%   BMI 28.17 kg/m  Wt Readings from Last 3 Encounters:  03/31/22 202 lb (91.6 kg)  01/20/22 204 lb (92.5 kg)  10/28/21  203 lb (92.1 kg)       Assessment & Plan:   Problem List Items Addressed This Visit       Unprioritized   Osteoarthritis of right hand - Primary    Recommended topical voltaren gel prn and tylenol '1000mg'$  bid.       Hyperlipidemia    Lab Results  Component Value Date   CHOL 187 01/23/2022   HDL 38 (L) 01/23/2022   LDLCALC 102 (H) 01/23/2022   LDLDIRECT 97.0 12/27/2015   TRIG 274 (H) 01/23/2022   CHOLHDL 4.9 01/23/2022  Discussed diet. Continue tricor, lipitor and fish oil.       HTN (hypertension)    BP Readings from Last 3 Encounters:  03/31/22 (!) 152/87  02/20/22 114/68  01/20/22 (!) 150/88  Initial bp was elevated but follow up blood pressure is normal. Continue current doses of metoprolol and valsartan and amlodipine.       Herpes simplex type 2 infection    No outbreaks on daily valtrex, continue same.       GERD (gastroesophageal reflux disease)    Stable on pantoprazole and HS wedge pillow.        ED (erectile dysfunction) of organic origin    Stable with cialis. Continue same.  Depression    Stable on serquel and depakote Management per psychiatry, Dr. Toy Care.       BPH with obstruction/lower urinary tract symptoms    Stable on cialis.       No orders of the defined types were placed in this encounter.   I, [name], NP, personally preformed the services described in this documentation.  All medical record entries made by the scribe were at my direction and in my presence.  I have reviewed the chart and discharge instructions (if applicable) and agree that the record reflects my personal performance and is accurate and complete. 03/31/2022   I,Amber Collins,acting as a scribe for [name], NP.,have documented all relevant documentation on the behalf of [name], NP,as directed by  [name], NP while in the presence of [name], NP.    [name], NP

## 2022-03-31 NOTE — Assessment & Plan Note (Signed)
Recommended topical voltaren gel prn and tylenol '1000mg'$  bid.

## 2022-03-31 NOTE — Assessment & Plan Note (Signed)
Stable with cialis. Continue same.

## 2022-03-31 NOTE — Telephone Encounter (Signed)
Patient notified of results.

## 2022-03-31 NOTE — Assessment & Plan Note (Signed)
Lab Results  Component Value Date   CHOL 187 01/23/2022   HDL 38 (L) 01/23/2022   LDLCALC 102 (H) 01/23/2022   LDLDIRECT 97.0 12/27/2015   TRIG 274 (H) 01/23/2022   CHOLHDL 4.9 01/23/2022   Discussed diet. Continue tricor, lipitor and fish oil.

## 2022-03-31 NOTE — Telephone Encounter (Signed)
-----   Message from Park Liter, MD sent at 02/28/2022  7:50 PM EDT ----- Fractional flow reserve show a lower likelihood for obstructive disease

## 2022-03-31 NOTE — Assessment & Plan Note (Addendum)
BP Readings from Last 3 Encounters:  03/31/22 (!) 152/87  02/20/22 114/68  01/20/22 (!) 150/88   Initial bp was elevated but follow up blood pressure is normal. Continue current doses of metoprolol and valsartan and amlodipine.

## 2022-03-31 NOTE — Assessment & Plan Note (Signed)
Stable on pantoprazole and HS wedge pillow.

## 2022-03-31 NOTE — Assessment & Plan Note (Signed)
Stable on cialis.

## 2022-04-01 ENCOUNTER — Other Ambulatory Visit: Payer: Self-pay | Admitting: Gastroenterology

## 2022-04-03 DIAGNOSIS — K2970 Gastritis, unspecified, without bleeding: Secondary | ICD-10-CM | POA: Insufficient documentation

## 2022-04-03 NOTE — Progress Notes (Signed)
Cardiology Office Note:    Date:  04/04/2022   ID:  Francisco Lopez, DOB 09/11/1960, MRN [id number]  PCP:  [name], NP  Cardiologist:  Shirlee More, MD    Referring MD: [name], NP    ASSESSMENT:    1. Coronary artery disease of native artery of native heart with stable angina pectoris (HCC)   2. Elevated coronary artery calcium score   3. Primary hypertension   4. Mixed hyperlipidemia   5. Elevated lipoprotein(a)    PLAN:    In order of problems listed above:  He has single-vessel nonflow limiting moderate stenosis right coronary artery at this time medical therapy he is on aspirin optimize his lipids by substituting PCSK9 inhibitor for Zetia to lower his LP(a) follow-up labs in 2 months his PCP office and I will plan to see him in 1 year in cardiology follow-up Continue beta-blocker And she a PCSK9 inhibitor Screen his daughter for LP(a)   Next appointment: 1 year   Medication Adjustments/Labs and Tests Ordered: Current medicines are reviewed at length with the patient today.  Concerns regarding medicines are outlined above.  Orders Placed This Encounter  Procedures   Lipid panel   Lipoprotein A (LPA)   Meds ordered this encounter  Medications   Evolocumab (REPATHA) 140 MG/ML SOSY    Sig: Inject 140 mg into the skin every 14 (fourteen) days.    Dispense:  2.1 mL    Refill:  12    Chief complaint follow-up after cardiac CTA   History of Present Illness:    Francisco Lopez is a 61 y.o. male with a hx of CAD elevated coronary calcium score hypertension and hyperlipidemia last seen 01/20/2022.  Compliance with diet, lifestyle and medications: Yes  I reviewed the results of his cardiac CTA with him.  He is not having any further discomfort He has elevated LP(a) likely the best treatment in today's world is combined high intensity statin PCSK9 inhibitor which lowers LP(a) and what we will do is optimize therapy dropping out Zetia and substituting  Repatha and in 2 months recheck his lipids and LP(a) with his PCP. None of his siblings are alive he does have a daughter and I told him that she should be screened if she has a 50% probability Despite is a very high calcium score he does not have multivessel or obstructive CAD He tolerates his lipid-lowering without muscle pain or weakness and is not having angina edema or shortness of breath  He had a cardiac CTA reported 02/21/2022 coronary calcium score of 920/90th percentile and moderate 50 to 69% stenosis of the right coronary artery.  FF for right coronary artery normal.  LP(a) was elevated 105.8 Component Ref Range & Units 2 mo ago  Lipoprotein (a) <75.0 nmol/L 105.   Recent lipid profile 2 months ago has a total cholesterol 187 LDL 102 on combined Zetia and atorvastatin. Past Medical History:  Diagnosis Date   Anxiety    Atypical chest pain 08/01/2019   Basal cell carcinoma 2006   BPH with obstruction/lower urinary tract symptoms 10/19/2014   Carpal tunnel syndrome 02/07/2016   Chronic rhinitis 07/24/2016   Depression    Diastolic dysfunction 32/44/0102   Per 2D echo 8/16   ED (erectile dysfunction) of organic origin 09/16/2014   Elevated coronary artery calcium score 02/06/2020   Frozen shoulder 2018   left   Gastritis    GERD (gastroesophageal reflux disease) 12/27/2015   Herpes simplex type 1 antibody positive 01/28/2015  Herpes simplex type 2 infection 01/28/2015   History of chicken pox    History of obstructive sleep apnea 10/29/2015   HTN (hypertension) 08/05/2014   Hyperlipidemia    Hypertension    Left shoulder pain 02/20/2017   Mood disorder ([place]) 08/25/2016   Preventative health care 09/16/2014    Past Surgical History:  Procedure Laterality Date   COLONOSCOPY     around 2015 [facility] in [place] Left 2000   meninscus repair   SHOULDER ARTHROSCOPY Left 01/2018   UPPER GASTROINTESTINAL ENDOSCOPY      Current  Medications: Current Meds  Medication Sig   amLODipine (NORVASC) 10 MG tablet Take 1 tablet by mouth once daily   aspirin EC 81 MG tablet Take 1 tablet (81 mg total) by mouth daily. Swallow whole.   atorvastatin (LIPITOR) 80 MG tablet TAKE 1 TABLET BY MOUTH EVERY DAY   divalproex (DEPAKOTE) 250 MG DR tablet TAKE 2 TO 3 TABLETS BY MOUTH EVERY EVENING   Evolocumab (REPATHA) 140 MG/ML SOSY Inject 140 mg into the skin every 14 (fourteen) days.   fenofibrate (TRICOR) 145 MG tablet TAKE 1 TABLET (145 MG TOTAL) BY MOUTH DAILY.   hydrOXYzine (VISTARIL) 25 MG capsule Take 1 capsule (25 mg total) by mouth every 8 (eight) hours as needed.   meloxicam (MOBIC) 7.5 MG tablet TAKE 1 TABLET BY MOUTH ONCE DAILY AS NEEDED FOR PAIN   metoprolol succinate (TOPROL XL) 25 MG 24 hr tablet Take 1 tablet (25 mg total) by mouth daily.   Multiple Vitamins-Minerals (MENS MULTIVITAMIN PLUS PO) Take 1 tablet by mouth daily in the afternoon.   Omega-3 Fatty Acids (FISH OIL) 1000 MG CAPS Take 2 capsules (2,000 mg total) by mouth 2 (two) times daily.   pantoprazole (PROTONIX) 20 MG tablet TAKE 1 TABLET BY MOUTH EVERY DAY   QUEtiapine (SEROQUEL) 400 MG tablet Take 400 mg by mouth at bedtime.   tadalafil (CIALIS) 5 MG tablet Take one tablet once daily.  If needed may take 5 mg additional dose prior to intercourse   valACYclovir (VALTREX) 500 MG tablet TAKE 1 TABLET BY MOUTH EVERY DAY   valsartan (DIOVAN) 160 MG tablet TAKE 1 TABLET BY MOUTH EVERY DAY   [DISCONTINUED] ezetimibe (ZETIA) 10 MG tablet Take 1 tablet (10 mg total) by mouth daily.     Allergies:   Patient has no known allergies.   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 There is no history of Colon cancer, Esophageal cancer, Stomach cancer, or Rectal cancer. ROS:   Please see the history of present illness.    All other systems reviewed and are negative.  EKGs/Labs/Other Studies Reviewed:    The following studies were reviewed today:   Recent Labs: 01/23/2022: BUN 18; Creatinine, Ser 0.91; Potassium 4.1; Sodium 140  Recent Lipid Panel    Component Value Date/Time   CHOL 187 01/23/2022 1521   TRIG 274 (H) 01/23/2022 1521   HDL 38 (L) 01/23/2022 1521   CHOLHDL 4.9 01/23/2022 1521   CHOLHDL 3 03/21/2021 1019   VLDL 23.0 03/21/2021 1019   LDLCALC 102 (H) 01/23/2022 1521   LDLDIRECT 97.0 12/27/2015 1651    Physical Exam:    VS:  BP (!) 142/74 (BP Location: Left Arm, Patient Position: Sitting, Cuff Size: Normal)   Pulse 78   Ht '5\' 10"'$  (1.778 m)   Wt 202 lb (91.6 kg)   SpO2 97%   BMI 28.98 kg/m     Wt Readings from Last 3 Encounters:  04/04/22 202 lb (91.6 kg)  03/31/22 202 lb (91.6 kg)  01/20/22 204 lb (92.5 kg)     GEN:  Well nourished, well developed in no acute distress he has no xanthoma  or xanthelasma HEENT: Normal NECK: No JVD; No carotid bruits LYMPHATICS: No lymphadenopathy CARDIAC: RRR, no murmurs, rubs, gallops RESPIRATORY:  Clear to auscultation without rales, wheezing or rhonchi  ABDOMEN: Soft, non-tender, non-distended MUSCULOSKELETAL:  No edema; No deformity  SKIN: Warm and dry NEUROLOGIC:  Alert and oriented x 3 PSYCHIATRIC:  Normal affect    Signed, Shirlee More, MD  04/04/2022 8:46 AM    [place]

## 2022-04-04 ENCOUNTER — Encounter: Payer: Self-pay | Admitting: Cardiology

## 2022-04-04 ENCOUNTER — Ambulatory Visit: Payer: BC Managed Care – PPO | Attending: Cardiology | Admitting: Cardiology

## 2022-04-04 VITALS — BP 128/70 | HR 78 | Ht 70.0 in | Wt 202.0 lb

## 2022-04-04 DIAGNOSIS — I10 Essential (primary) hypertension: Secondary | ICD-10-CM

## 2022-04-04 DIAGNOSIS — I25118 Atherosclerotic heart disease of native coronary artery with other forms of angina pectoris: Secondary | ICD-10-CM | POA: Diagnosis not present

## 2022-04-04 DIAGNOSIS — R931 Abnormal findings on diagnostic imaging of heart and coronary circulation: Secondary | ICD-10-CM | POA: Diagnosis not present

## 2022-04-04 DIAGNOSIS — E782 Mixed hyperlipidemia: Secondary | ICD-10-CM

## 2022-04-04 DIAGNOSIS — E7841 Elevated Lipoprotein(a): Secondary | ICD-10-CM

## 2022-04-04 MED ORDER — REPATHA 140 MG/ML ~~LOC~~ SOSY: 140.0000 mg | PREFILLED_SYRINGE | SUBCUTANEOUS | 12 refills | Status: DC

## 2022-04-04 NOTE — Patient Instructions (Signed)
Activity minimum 2500 steps daily with a goal of 8500 steps.  Check home Bp/HR at home and record.  Have your daughter check for Lpa (lipoprotein A).  Medication Instructions:  Your physician has recommended you make the following change in your medication:   Stop Ezetimibe (Zetia)  Start Repatha 140 mg every 2 weeks.  *If you need a refill on your cardiac medications before your next appointment, please call your pharmacy*   Lab Work: Your physician recommends that you return for lab work in: 2 months You need to have labs done when you are fasting.  You can come Monday through Friday 8:30 am to 12:00 pm and 1:15 to 4:30. You do not need to make an appointment as the order has already been placed. The labs you are going to have done are LPa and Lipids.  If you have labs (blood work) drawn today and your tests are completely normal, you will receive your results only by: [place] (if you have MyChart) OR A paper copy in the mail If you have any lab test that is abnormal or we need to change your treatment, we will call you to review the results.   Testing/Procedures: None ordered   Follow-Up: At [facility], you and your health needs are our priority.  As part of our continuing mission to provide you with exceptional heart care, we have created designated Provider Care Teams.  These Care Teams include your primary Cardiologist (physician) and Advanced Practice Providers (APPs -  Physician Assistants and Nurse Practitioners) who all work together to provide you with the care you need, when you need it.  We recommend signing up for the patient portal called "MyChart".  Sign up information is provided on this After Visit Summary.  MyChart is used to connect with patients for Virtual Visits (Telemedicine).  Patients are able to view lab/test results, encounter notes, upcoming appointments, etc.  Non-urgent messages can be sent to your provider as well.   To learn more about  what you can do with MyChart, go to [email or web address].    Your next appointment:   12 month(s)  The format for your next appointment:   In Person  Provider:   Shirlee More, MD   Other Instructions NA

## 2022-04-05 ENCOUNTER — Telehealth: Payer: Self-pay | Admitting: Cardiology

## 2022-04-05 NOTE — Telephone Encounter (Signed)
Pt c/o medication issue:  1. Name of Medication: Evolocumab (REPATHA) 140 MG/ML SOSY  2. How are you currently taking this medication (dosage and times per day)? Inject 140 mg into the skin every 14 (fourteen) days. - Subcutaneous  3. Are you having a reaction (difficulty breathing--STAT)?   4. What is your medication issue? Pt called stating pharmacy needs prior authorization for this medication

## 2022-04-05 NOTE — Telephone Encounter (Signed)
PA for Repatha submitted. Key:  [id number].  Message from Plan Please contact the members plan for prior authorization request.

## 2022-04-06 NOTE — Telephone Encounter (Signed)
Pharmacy had started a PA Key: [id number] Submitted to plan. Waiting on determination.

## 2022-04-11 ENCOUNTER — Telehealth: Payer: Self-pay | Admitting: Cardiology

## 2022-04-11 DIAGNOSIS — I25118 Atherosclerotic heart disease of native coronary artery with other forms of angina pectoris: Secondary | ICD-10-CM

## 2022-04-11 DIAGNOSIS — E7841 Elevated Lipoprotein(a): Secondary | ICD-10-CM

## 2022-04-11 DIAGNOSIS — R931 Abnormal findings on diagnostic imaging of heart and coronary circulation: Secondary | ICD-10-CM

## 2022-04-11 MED ORDER — REPATHA 140 MG/ML ~~LOC~~ SOSY: 140.0000 mg | PREFILLED_SYRINGE | SUBCUTANEOUS | 12 refills | Status: DC

## 2022-04-11 NOTE — Telephone Encounter (Signed)
RX sent

## 2022-04-11 NOTE — Telephone Encounter (Signed)
Repatha PA has been approved.  Coverage dates: 02/08/2022 to 10/09/2022

## 2022-04-11 NOTE — Telephone Encounter (Signed)
*  STAT* If patient is at the pharmacy, call can be transferred to refill team.   1. Which medications need to be refilled? (please list name of each medication and dose if known) Repatha  2. Which pharmacy/location (including street and city if local pharmacy) is medication to be sent to?[facility]  [address], [place]  3. Do they need a 30 day or 90 day supply? Need this asap-please  was supposed to have had it since 04-04-22

## 2022-04-20 DIAGNOSIS — F4322 Adjustment disorder with anxiety: Secondary | ICD-10-CM | POA: Diagnosis not present

## 2022-04-20 DIAGNOSIS — F4312 Post-traumatic stress disorder, chronic: Secondary | ICD-10-CM | POA: Diagnosis not present

## 2022-05-31 ENCOUNTER — Other Ambulatory Visit: Payer: Self-pay | Admitting: Cardiology

## 2022-05-31 ENCOUNTER — Other Ambulatory Visit: Payer: Self-pay | Admitting: Family

## 2022-05-31 DIAGNOSIS — Z79899 Other long term (current) drug therapy: Secondary | ICD-10-CM

## 2022-05-31 DIAGNOSIS — E782 Mixed hyperlipidemia: Secondary | ICD-10-CM

## 2022-06-08 DIAGNOSIS — F3189 Other bipolar disorder: Secondary | ICD-10-CM | POA: Diagnosis not present

## 2022-06-08 DIAGNOSIS — F410 Panic disorder [episodic paroxysmal anxiety] without agoraphobia: Secondary | ICD-10-CM | POA: Diagnosis not present

## 2022-06-15 DIAGNOSIS — R931 Abnormal findings on diagnostic imaging of heart and coronary circulation: Secondary | ICD-10-CM | POA: Diagnosis not present

## 2022-06-15 DIAGNOSIS — E7841 Elevated Lipoprotein(a): Secondary | ICD-10-CM | POA: Diagnosis not present

## 2022-06-15 DIAGNOSIS — I25118 Atherosclerotic heart disease of native coronary artery with other forms of angina pectoris: Secondary | ICD-10-CM | POA: Diagnosis not present

## 2022-06-16 ENCOUNTER — Telehealth: Payer: Self-pay | Admitting: Cardiology

## 2022-06-16 ENCOUNTER — Other Ambulatory Visit: Payer: Self-pay

## 2022-06-16 LAB — LIPID PANEL
Chol/HDL Ratio: 1.8 ratio (ref 0.0–5.0)
Cholesterol, Total: 70 mg/dL — ABNORMAL LOW (ref 100–199)
HDL: 38 mg/dL — ABNORMAL LOW (ref >39)
LDL Chol Calc (NIH): 9 mg/dL (ref 0–99)
Triglycerides: 135 mg/dL (ref 0–149)
VLDL Cholesterol Cal: 23 mg/dL (ref 5–40)

## 2022-06-16 LAB — LIPOPROTEIN A (LPA): Lipoprotein (a): 106.1 nmol/L — ABNORMAL HIGH (ref <75.0)

## 2022-06-16 MED ORDER — ATORVASTATIN CALCIUM 40 MG PO TABS: 40.0000 mg | ORAL_TABLET | Freq: Every day | ORAL | 3 refills | Status: DC

## 2022-06-16 NOTE — Telephone Encounter (Signed)
Pt returning call for lab results  

## 2022-06-16 NOTE — Telephone Encounter (Signed)
Patient informed of results.  

## 2022-06-20 DIAGNOSIS — F4322 Adjustment disorder with anxiety: Secondary | ICD-10-CM | POA: Diagnosis not present

## 2022-06-20 DIAGNOSIS — F4312 Post-traumatic stress disorder, chronic: Secondary | ICD-10-CM | POA: Diagnosis not present

## 2022-07-05 ENCOUNTER — Telehealth: Payer: Self-pay | Admitting: Cardiology

## 2022-07-05 NOTE — Telephone Encounter (Signed)
Pt c/o medication issue:  1. Name of Medication:  Evolocumab (REPATHA) 140 MG/ML SOSY  2. How are you currently taking this medication (dosage and times per day)?   3. Are you having a reaction (difficulty breathing--STAT)?   4. What is your medication issue?   Patient states this medication increased from about $4 to $125. Would like to know if we have any coupons to assist with purchasing or if an alternative can be prescribed. Please advise.

## 2022-07-05 NOTE — Telephone Encounter (Signed)
Sent patient a Pharmacist, community message with link to Repatha copay cards and let him know if he needs anything to please let us know.  [email or web address]

## 2022-09-03 ENCOUNTER — Other Ambulatory Visit: Payer: Self-pay | Admitting: Family

## 2022-09-03 NOTE — Telephone Encounter (Signed)
Please schedule pt a follow up visit in 1 month.

## 2022-09-05 NOTE — Telephone Encounter (Signed)
Lvm2 sched  

## 2022-09-12 DIAGNOSIS — F4322 Adjustment disorder with anxiety: Secondary | ICD-10-CM | POA: Diagnosis not present

## 2022-09-12 DIAGNOSIS — F4312 Post-traumatic stress disorder, chronic: Secondary | ICD-10-CM | POA: Diagnosis not present

## 2022-09-27 ENCOUNTER — Ambulatory Visit: Payer: BC Managed Care – PPO | Admitting: Family

## 2022-09-27 VITALS — BP 120/69 | HR 67 | Temp 98.1°F | Resp 16 | Wt 205.0 lb

## 2022-09-27 DIAGNOSIS — F39 Unspecified mood [affective] disorder: Secondary | ICD-10-CM | POA: Diagnosis not present

## 2022-09-27 DIAGNOSIS — B009 Herpesviral infection, unspecified: Secondary | ICD-10-CM

## 2022-09-27 DIAGNOSIS — I10 Essential (primary) hypertension: Secondary | ICD-10-CM

## 2022-09-27 DIAGNOSIS — K2100 Gastro-esophageal reflux disease with esophagitis, without bleeding: Secondary | ICD-10-CM | POA: Diagnosis not present

## 2022-09-27 DIAGNOSIS — E785 Hyperlipidemia, unspecified: Secondary | ICD-10-CM

## 2022-09-27 LAB — COMPREHENSIVE METABOLIC PANEL
ALT: 20 U/L (ref 0–53)
AST: 18 U/L (ref 0–37)
Albumin: 4.3 g/dL (ref 3.5–5.2)
Alkaline Phosphatase: 38 U/L — ABNORMAL LOW (ref 39–117)
BUN: 13 mg/dL (ref 6–23)
CO2: 29 mEq/L (ref 19–32)
Calcium: 9.3 mg/dL (ref 8.4–10.5)
Chloride: 103 mEq/L (ref 96–112)
Creatinine, Ser: 0.96 mg/dL (ref 0.40–1.50)
GFR: 85.14 mL/min (ref >60.00)
Glucose, Bld: 107 mg/dL — ABNORMAL HIGH (ref 70–99)
Potassium: 4.7 mEq/L (ref 3.5–5.1)
Sodium: 141 mEq/L (ref 135–145)
Total Bilirubin: 0.5 mg/dL (ref 0.2–1.2)
Total Protein: 6.4 g/dL (ref 6.0–8.3)

## 2022-09-27 MED ORDER — AMLODIPINE BESYLATE 10 MG PO TABS: 10.0000 mg | ORAL_TABLET | Freq: Every day | ORAL | 1 refills | Status: DC

## 2022-09-27 MED ORDER — VALACYCLOVIR HCL 500 MG PO TABS: 500.0000 mg | ORAL_TABLET | Freq: Every day | ORAL | 1 refills | Status: DC

## 2022-09-27 MED ORDER — TADALAFIL 5 MG PO TABS: ORAL_TABLET | ORAL | 1 refills | Status: DC

## 2022-09-27 NOTE — Assessment & Plan Note (Addendum)
Lab Results  Component Value Date   CHOL 70 (L) 06/15/2022   HDL 38 (L) 06/15/2022   LDLCALC 9 06/15/2022   LDLDIRECT 97.0 12/27/2015   TRIG 135 06/15/2022   CHOLHDL 1.8 06/15/2022   Maintained on lipitor, repatha.  He is no longer taking tricor and I advised him OK to remain off of tricor as his triglycerides look good.

## 2022-09-27 NOTE — Assessment & Plan Note (Signed)
Stable on pantoprazole .

## 2022-09-27 NOTE — Assessment & Plan Note (Signed)
No breakouts on daily valtrex.  Continue same.

## 2022-09-27 NOTE — Progress Notes (Signed)
Subjective:   By signing my name below, I, Francisco Lopez, attest that this documentation has been prepared under the direction and in the presence of [name], NP. 09/27/2022   Patient ID: Francisco Lopez, male    DOB: 06-14-1960, 62 y.o.   MRN: [id number]  Chief Complaint  Patient presents with   Hypertension    Here for follow up    HPI Patient is in today for a follow-up appointment.   Blood pressure: His blood pressure is stable during this visit. He takes 10 mg amlodipine daily PO, 25 mg metoprolol succinate daily PO, and 160 mg valsartan to manage it.   Cholesterol: He receives a Repatha injection every two weeks. He takes 40 mg atorvastatin daily PO to help manage his cholesterol.   Urinary: He takes 5 mg tadalafil as needed for his ED and finds relief while taking it.   Valtrex: He takes 500 mg Valtrex daily PO and has not had any break outs while taking it.  Mood: His mood has been stable. He is still following up with psychiatry. He is no longer taking hydroxyzine for his anxiety.    Past Medical History:  Diagnosis Date   Anxiety    Atypical chest pain 08/01/2019   Basal cell carcinoma 2006   BPH with obstruction/lower urinary tract symptoms 10/19/2014   Carpal tunnel syndrome 02/07/2016   Chronic rhinitis 07/24/2016   Depression    Diastolic dysfunction 02/04/2015   Per 2D echo 8/16   ED (erectile dysfunction) of organic origin 09/16/2014   Elevated coronary artery calcium score 02/06/2020   Frozen shoulder 2018   left   Gastritis    GERD (gastroesophageal reflux disease) 12/27/2015   Herpes simplex type 1 antibody positive 01/28/2015   Herpes simplex type 2 infection 01/28/2015   History of chicken pox    History of obstructive sleep apnea 10/29/2015   HTN (hypertension) 08/05/2014   Hyperlipidemia    Hypertension    Left shoulder pain 02/20/2017   Mood disorder 08/25/2016   Preventative health care 09/16/2014    Past Surgical History:   Procedure Laterality Date   COLONOSCOPY     around 2015 [facility] in [place]   KNEE SURGERY Left 2000   meninscus repair   SHOULDER ARTHROSCOPY Left 01/2018   UPPER GASTROINTESTINAL ENDOSCOPY      Family History  Problem Relation Age of Onset   Diabetes Mother        type II   Anxiety disorder Mother    Hypertension Father    Alcohol abuse Father    Liver cancer Sister    Hemachromatosis Sister    COPD Sister        died from covid complication   Diabetes Mellitus II Sister    Hemachromatosis Brother    Liver cancer Brother    CAD Brother        smoker   CAD Brother        smoker   Colon cancer Neg Hx    Esophageal cancer Neg Hx    Stomach cancer Neg Hx    Rectal cancer Neg Hx     Social History   Socioeconomic History   Marital status: Divorced    Spouse name: Not on file   Number of children: Not on file   Years of education: Not on file   Highest education level: 8th grade  Occupational History   Not on file  Tobacco Use   Smoking status: Never  Smokeless tobacco: Never  Vaping Use   Vaping Use: Never used  Substance and Sexual Activity   Alcohol use: No   Drug use: No   Sexual activity: Yes  Other Topics Concern   Not on file  Social History Narrative   Separated   1 child (56 yr old daughter)- lives in [place]   Drives 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[employer], 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   Social Determinants of Health   Financial Resource Strain: Low Risk  (09/27/2022)   Overall Financial Resource Strain (CARDIA)    Difficulty of Paying Living Expenses: Not hard at all  Food Insecurity: No Food Insecurity (09/27/2022)   Hunger Vital Sign    Worried About Running Out of Food in the Last Year: Never true    Ran Out of Food in the Last Year: Never true  Transportation Needs: Unknown (09/27/2022)   PRAPARE - Administrator, Civil Service (Medical): Not on file    Lack of Transportation (Non-Medical): Patient declined  Physical Activity:  Insufficiently Active (09/27/2022)   Exercise Vital Sign    Days of Exercise per Week: 3 days    Minutes of Exercise per Session: 30 min  Stress: No Stress Concern Present (09/27/2022)   [employer] of Occupational Health - Occupational Stress Questionnaire    Feeling of Stress : Not at all  Social Connections: Not on file  Intimate Partner Violence: Not on file    Outpatient Medications Prior to Visit  Medication Sig Dispense Refill   aspirin EC 81 MG tablet Take 1 tablet (81 mg total) by mouth daily. Swallow whole. 90 tablet 3   atorvastatin (LIPITOR) 40 MG tablet Take 1 tablet (40 mg total) by mouth daily. 90 tablet 3   divalproex (DEPAKOTE) 250 MG DR tablet TAKE 2 TO 3 TABLETS BY MOUTH EVERY EVENING  12   Evolocumab (REPATHA) 140 MG/ML SOSY Inject 140 mg into the skin every 14 (fourteen) days. 2.1 mL 12   metoprolol succinate (TOPROL XL) 25 MG 24 hr tablet Take 1 tablet (25 mg total) by mouth daily. 90 tablet 3   Multiple Vitamins-Minerals (MENS MULTIVITAMIN PLUS PO) Take 1 tablet by mouth daily in the afternoon.     Omega-3 Fatty Acids (FISH OIL) 1000 MG CAPS Take 2 capsules (2,000 mg total) by mouth 2 (two) times daily. 180 capsule 3   pantoprazole (PROTONIX) 20 MG tablet TAKE 1 TABLET BY MOUTH EVERY DAY 30 tablet 5   QUEtiapine (SEROQUEL) 400 MG tablet Take 400 mg by mouth at bedtime.     valsartan (DIOVAN) 160 MG tablet TAKE 1 TABLET BY MOUTH EVERY DAY 90 tablet 1   amLODipine (NORVASC) 10 MG tablet Take 1 tablet by mouth once daily 90 tablet 3   fenofibrate (TRICOR) 145 MG tablet TAKE 1 TABLET (145 MG TOTAL) BY MOUTH DAILY. 30 tablet 5   hydrOXYzine (VISTARIL) 25 MG capsule Take 1 capsule (25 mg total) by mouth every 8 (eight) hours as needed. 30 capsule 1   tadalafil (CIALIS) 5 MG tablet Take one tablet once daily.  If needed may take 5 mg additional dose prior to intercourse 60 tablet 1   valACYclovir (VALTREX) 500 MG tablet TAKE 1 TABLET BY MOUTH EVERY DAY 90 tablet 1    meloxicam (MOBIC) 7.5 MG tablet TAKE 1 TABLET BY MOUTH ONCE DAILY AS NEEDED FOR PAIN 14 tablet 0   No facility-administered medications prior to visit.    No Known Allergies  ROS  See HPI    Objective:    Physical Exam Constitutional:      General: He is not in acute distress.    Appearance: Normal appearance.  HENT:     Head: Normocephalic and atraumatic.     Right Ear: External ear normal.     Left Ear: External ear normal.  Eyes:     Pupils: Pupils are equal, round, and reactive to light.  Cardiovascular:     Rate and Rhythm: Normal rate and regular rhythm.     Heart sounds: No murmur heard.    No gallop.  Pulmonary:     Effort: Pulmonary effort is normal. No respiratory distress.     Breath sounds: Normal breath sounds. No wheezing or rales.  Skin:    General: Skin is warm.  Neurological:     Mental Status: He is alert and oriented to person, place, and time.  Psychiatric:        Judgment: Judgment normal.     BP 120/69 (BP Location: Right Arm, Patient Position: Sitting, Cuff Size: Small)   Pulse 67   Temp 98.1 F (36.7 C) (Oral)   Resp 16   Wt 205 lb (93 kg)   SpO2 96%   BMI 29.41 kg/m  Wt Readings from Last 3 Encounters:  09/27/22 205 lb (93 kg)  04/04/22 202 lb (91.6 kg)  03/31/22 202 lb (91.6 kg)       Assessment & Plan:  Primary hypertension Assessment & Plan: BP at goal on metoprolol, valsartan and amlodipine.   Orders: -     Comprehensive metabolic panel  Mood disorder Assessment & Plan: Continues to follow with psychiatry and reports stable mood.    Gastroesophageal reflux disease with esophagitis without hemorrhage Assessment & Plan: Stable on pantoprazole .     Hyperlipidemia, unspecified hyperlipidemia type Assessment & Plan: Lab Results  Component Value Date   CHOL 70 (L) 06/15/2022   HDL 38 (L) 06/15/2022   LDLCALC 9 06/15/2022   LDLDIRECT 97.0 12/27/2015   TRIG 135 06/15/2022   CHOLHDL 1.8 06/15/2022   Maintained  on lipitor, repatha.  He is no longer taking tricor and I advised him OK to remain off of tricor as his triglycerides look good.    Herpes simplex type 2 infection Assessment & Plan: No breakouts on daily valtrex.  Continue same.    Other orders -     valACYclovir HCl; Take 1 tablet (500 mg total) by mouth daily.  Dispense: 90 tablet; Refill: 1 -     Tadalafil; Take one tablet once daily.  If needed may take 5 mg additional dose prior to intercourse  Dispense: 90 tablet; Refill: 1 -     amLODIPine Besylate; Take 1 tablet (10 mg total) by mouth daily.  Dispense: 90 tablet; Refill: 1    I, Lemont Fillers, NP, personally preformed the services described in this documentation.  All medical record entries made by the scribe were at my direction and in my presence.  I have reviewed the chart and discharge instructions (if applicable) and agree that the record reflects my personal performance and is accurate and complete. 09/27/2022  Lemont Fillers, NP  [facility] as a scribe for Lemont Fillers, NP.,have documented all relevant documentation on the behalf of Lemont Fillers, NP,as directed by  Lemont Fillers, NP while in the presence of Lemont Fillers, NP.

## 2022-09-27 NOTE — Assessment & Plan Note (Signed)
BP at goal on metoprolol, valsartan and amlodipine.

## 2022-09-27 NOTE — Assessment & Plan Note (Signed)
Continues to follow with psychiatry and reports stable mood.

## 2022-10-10 ENCOUNTER — Other Ambulatory Visit: Payer: Self-pay | Admitting: Gastroenterology

## 2022-10-19 ENCOUNTER — Telehealth: Payer: Self-pay

## 2022-10-19 ENCOUNTER — Other Ambulatory Visit (HOSPITAL_COMMUNITY): Payer: Self-pay

## 2022-10-19 NOTE — Telephone Encounter (Signed)
Pharmacy Patient Advocate Encounter  Prior Authorization for REPATHA has been approved.    Effective dates: 10/18/22 through 10/18/23      Received notification from [facility] that prior authorization for REPATHA is needed.    PA submitted on 10/19/22 Key [id number] Status is pending  Haze Rushing, CPhT Pharmacy Patient Advocate Specialist Direct Number: [phone] Fax: [phone]

## 2022-10-30 ENCOUNTER — Telehealth: Payer: Self-pay | Admitting: Cardiology

## 2022-10-30 NOTE — Telephone Encounter (Signed)
Pt c/o medication issue:  1. Name of Medication:  Evolocumab (REPATHA) 140 MG/ML SOSY  2. How are you currently taking this medication (dosage and times per day)?   3. Are you having a reaction (difficulty breathing--STAT)?   4. What is your medication issue?   Patient would like to know if a coupon can be sent to [place] at [employer]. He states his current coupon expired.

## 2022-10-30 NOTE — Telephone Encounter (Signed)
Left a voice mail telling patient that he would have to go online for a new card and I have sent him a mychart message with the website and to contact us with any further questions.

## 2022-12-19 DIAGNOSIS — F4322 Adjustment disorder with anxiety: Secondary | ICD-10-CM | POA: Diagnosis not present

## 2022-12-19 DIAGNOSIS — F4312 Post-traumatic stress disorder, chronic: Secondary | ICD-10-CM | POA: Diagnosis not present

## 2023-02-05 ENCOUNTER — Ambulatory Visit: Payer: BC Managed Care – PPO | Admitting: Family

## 2023-02-23 ENCOUNTER — Ambulatory Visit: Payer: BC Managed Care – PPO | Admitting: Family

## 2023-02-23 VITALS — BP 134/73 | HR 69 | Temp 98.4°F | Resp 16 | Wt 205.0 lb

## 2023-02-23 DIAGNOSIS — E785 Hyperlipidemia, unspecified: Secondary | ICD-10-CM | POA: Diagnosis not present

## 2023-02-23 DIAGNOSIS — Z23 Encounter for immunization: Secondary | ICD-10-CM

## 2023-02-23 DIAGNOSIS — F39 Unspecified mood [affective] disorder: Secondary | ICD-10-CM | POA: Diagnosis not present

## 2023-02-23 DIAGNOSIS — B009 Herpesviral infection, unspecified: Secondary | ICD-10-CM | POA: Diagnosis not present

## 2023-02-23 DIAGNOSIS — I10 Essential (primary) hypertension: Secondary | ICD-10-CM | POA: Diagnosis not present

## 2023-02-23 LAB — BASIC METABOLIC PANEL
BUN: 18 mg/dL (ref 6–23)
CO2: 26 meq/L (ref 19–32)
Calcium: 9.3 mg/dL (ref 8.4–10.5)
Chloride: 105 meq/L (ref 96–112)
Creatinine, Ser: 0.93 mg/dL (ref 0.40–1.50)
GFR: 88.19 mL/min (ref >60.00)
Glucose, Bld: 105 mg/dL — ABNORMAL HIGH (ref 70–99)
Potassium: 4.4 meq/L (ref 3.5–5.1)
Sodium: 140 meq/L (ref 135–145)

## 2023-02-23 NOTE — Assessment & Plan Note (Signed)
  No recent outbreaks on Valtrex. -Continue current regimen.

## 2023-02-23 NOTE — Assessment & Plan Note (Addendum)
Lab Results  Component Value Date   CHOL 70 (L) 06/15/2022   HDL 38 (L) 06/15/2022   LDLCALC 9 06/15/2022   LDLDIRECT 97.0 12/27/2015   TRIG 135 06/15/2022   CHOLHDL 1.8 06/15/2022   Lipids look amazing on Repatha/Atorvastatin which is being managed by Cardiology.

## 2023-02-23 NOTE — Assessment & Plan Note (Signed)
  Stable mood on Depakote and Seroquel. -Continue current regimen per psychiatry.

## 2023-02-23 NOTE — Addendum Note (Signed)
Addended by: Wilford Corner on: 02/23/2023 10:53 AM   Modules accepted: Orders

## 2023-02-23 NOTE — Progress Notes (Signed)
Subjective:     Patient ID: Francisco Lopez, male    DOB: 02/05/1961, 62 y.o.   MRN: [id number]  Chief Complaint  Patient presents with   Hypertension    Here for follow up    Hypertension    Discussed the use of AI scribe software for clinical note transcription with the patient, who gave verbal consent to proceed.  History of Present Illness   The patient, with a history of hypertension, hyperlipidemia, and psychiatric illness, presents for a routine follow-up. He reports feeling better than ever, with stable mood and no recent herpes simplex virus outbreaks. He has been taking amlodipine 10mg , metoprolol 25mg , valsartan 160mg , Lipitor 40mg , Repatha, Depakote, Seroquel, Cialis, and Valtrex. The patient has noticed significant improvement in his cholesterol levels since starting Repatha about 7-8 months ago, with his LDL dropping from 102 to 9. He also reports that Cialis has been helpful for both prostate issues and erectile dysfunction. He plans to receive his flu shot tomorrow.                                                                                                                                     Health Maintenance Due  Topic Date Due   DTaP/Tdap/Td (2 - Tdap) 09/05/2021    Past Medical History:  Diagnosis Date   Anxiety    Atypical chest pain 08/01/2019   Basal cell carcinoma 2006   BPH with obstruction/lower urinary tract symptoms 10/19/2014   Carpal tunnel syndrome 02/07/2016   Chronic rhinitis 07/24/2016   Depression    Diastolic dysfunction 02/04/2015   Per 2D echo 8/16   ED (erectile dysfunction) of organic origin 09/16/2014   Elevated coronary artery calcium score 02/06/2020   Frozen shoulder 2018   left   Gastritis    GERD (gastroesophageal reflux disease) 12/27/2015   Herpes simplex type 1 antibody positive 01/28/2015   Herpes simplex type 2 infection 01/28/2015   History of chicken pox    History of obstructive sleep apnea 10/29/2015   HTN  (hypertension) 08/05/2014   Hyperlipidemia    Hypertension    Left shoulder pain 02/20/2017   Mood disorder (HCC) 08/25/2016   Preventative health care 09/16/2014    Past Surgical History:  Procedure Laterality Date   COLONOSCOPY     around 2015 [facility] in [place]   KNEE SURGERY Left 2000   meninscus repair   SHOULDER ARTHROSCOPY Left 01/2018   UPPER GASTROINTESTINAL ENDOSCOPY      Family History  Problem Relation Age of Onset   Diabetes Mother        type II   Anxiety disorder Mother    Hypertension Father    Alcohol abuse Father    Liver cancer Sister    Hemachromatosis Sister    COPD Sister        died from covid complication   Diabetes Mellitus II Sister    Hemachromatosis  Brother    Liver cancer Brother    CAD Brother        smoker   CAD Brother        smoker   Colon cancer Neg Hx    Esophageal cancer Neg Hx    Stomach cancer Neg Hx    Rectal cancer Neg Hx     Social History   Socioeconomic History   Marital status: Divorced    Spouse name: Not on file   Number of children: Not on file   Years of education: Not on file   Highest education level: 8th grade  Occupational History   Not on file  Tobacco Use   Smoking status: Never   Smokeless tobacco: Never  Vaping Use   Vaping status: Never Used  Substance and Sexual Activity   Alcohol use: No   Drug use: No   Sexual activity: Yes  Other Topics Concern   Not on file  Social History Narrative   Separated   1 child (64 yr old daughter)- lives in [place]   Drives 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[employer], 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   Social Determinants of Health   Financial Resource Strain: Low Risk  (09/27/2022)   Overall Financial Resource Strain (CARDIA)    Difficulty of Paying Living Expenses: Not hard at all  Food Insecurity: No Food Insecurity (09/27/2022)   Hunger Vital Sign    Worried About Running Out of Food in the Last Year: Never true    Ran Out of Food in the Last Year: Never true   Transportation Needs: Unknown (09/27/2022)   PRAPARE - Administrator, Civil Service (Medical): Not on file    Lack of Transportation (Non-Medical): Patient declined  Physical Activity: Insufficiently Active (09/27/2022)   Exercise Vital Sign    Days of Exercise per Week: 3 days    Minutes of Exercise per Session: 30 min  Stress: No Stress Concern Present (09/27/2022)   [employer] of Occupational Health - Occupational Stress Questionnaire    Feeling of Stress : Not at all  Social Connections: Not on file  Intimate Partner Violence: Not on file    Outpatient Medications Prior to Visit  Medication Sig Dispense Refill   amLODipine (NORVASC) 10 MG tablet Take 1 tablet (10 mg total) by mouth daily. 90 tablet 1   aspirin EC 81 MG tablet Take 1 tablet (81 mg total) by mouth daily. Swallow whole. 90 tablet 3   atorvastatin (LIPITOR) 40 MG tablet Take 1 tablet (40 mg total) by mouth daily. 90 tablet 3   divalproex (DEPAKOTE) 250 MG DR tablet TAKE 2 TO 3 TABLETS BY MOUTH EVERY EVENING  12   Evolocumab (REPATHA) 140 MG/ML SOSY Inject 140 mg into the skin every 14 (fourteen) days. 2.1 mL 12   metoprolol succinate (TOPROL XL) 25 MG 24 hr tablet Take 1 tablet (25 mg total) by mouth daily. 90 tablet 3   Multiple Vitamins-Minerals (MENS MULTIVITAMIN PLUS PO) Take 1 tablet by mouth daily in the afternoon.     Omega-3 Fatty Acids (FISH OIL) 1000 MG CAPS Take 2 capsules (2,000 mg total) by mouth 2 (two) times daily. 180 capsule 3   pantoprazole (PROTONIX) 20 MG tablet TAKE 1 TABLET BY MOUTH EVERY DAY 90 tablet 0   QUEtiapine (SEROQUEL) 400 MG tablet Take 400 mg by mouth at bedtime.     tadalafil (CIALIS) 5 MG tablet Take one tablet once daily.  If needed may take  5 mg additional dose prior to intercourse 90 tablet 1   valACYclovir (VALTREX) 500 MG tablet Take 1 tablet (500 mg total) by mouth daily. 90 tablet 1   valsartan (DIOVAN) 160 MG tablet TAKE 1 TABLET BY MOUTH EVERY DAY 90 tablet 1    No facility-administered medications prior to visit.    No Known Allergies  ROS    See HPI  Objective:    Physical Exam Constitutional:      General: He is not in acute distress.    Appearance: He is well-developed.  HENT:     Head: Normocephalic and atraumatic.  Cardiovascular:     Rate and Rhythm: Normal rate and regular rhythm.     Heart sounds: No murmur heard. Pulmonary:     Effort: Pulmonary effort is normal. No respiratory distress.     Breath sounds: Normal breath sounds. No wheezing or rales.  Skin:    General: Skin is warm and dry.  Neurological:     Mental Status: He is alert and oriented to person, place, and time.  Psychiatric:        Behavior: Behavior normal.        Thought Content: Thought content normal.      BP 134/73 (BP Location: Right Arm, Patient Position: Sitting, Cuff Size: Small)   Pulse 69   Temp 98.4 F (36.9 C) (Oral)   Resp 16   Wt 205 lb (93 kg)   SpO2 97%   BMI 29.41 kg/m  Wt Readings from Last 3 Encounters:  02/23/23 205 lb (93 kg)  09/27/22 205 lb (93 kg)  04/04/22 202 lb (91.6 kg)       Assessment & Plan:   Problem List Items Addressed This Visit       Unprioritized   Mood disorder (HCC)     Stable mood on Depakote and Seroquel. -Continue current regimen per psychiatry.      Hyperlipidemia - Primary    Lab Results  Component Value Date   CHOL 70 (L) 06/15/2022   HDL 38 (L) 06/15/2022   LDLCALC 9 06/15/2022   LDLDIRECT 97.0 12/27/2015   TRIG 135 06/15/2022   CHOLHDL 1.8 06/15/2022   Lipids look amazing on Repatha/Atorvastatin which is being managed by Cardiology.       HTN (hypertension)     Well controlled on Amlodipine 10mg , Metoprolol 25mg , and Valsartan 160mg . -Continue current regimen.       Relevant Orders   Basic Metabolic Panel (BMET)   Herpes simplex type 2 infection     No recent outbreaks on Valtrex. -Continue current regimen.      General Health Maintenance -Administer Tdap  vaccine today. -Plan for influenza vaccine at external location. -Check kidney function today due to potential impact of medications. -Follow-up in six months.  I am having Francisco Lopez maintain his Multiple Vitamins-Minerals (MENS MULTIVITAMIN PLUS PO), divalproex, aspirin EC, Fish Oil, QUEtiapine, metoprolol succinate, Repatha, atorvastatin, valsartan, valACYclovir, tadalafil, amLODipine, and pantoprazole.  No orders of the defined types were placed in this encounter.

## 2023-02-23 NOTE — Patient Instructions (Signed)
VISIT SUMMARY:  During your recent visit, we discussed your ongoing conditions including hypertension, hyperlipidemia, depression, prostate and erectile dysfunction, and herpes simplex virus. You reported feeling better than ever, with stable mood and no recent herpes outbreaks. Your cholesterol levels have significantly improved since starting Repatha, and Cialis has been helpful for both prostate issues and erectile dysfunction. You plan to receive your flu shot tomorrow.  YOUR PLAN:  -HYPERTENSION: Your high blood pressure is well controlled with your current medications (Amlodipine, Metoprolol, and Valsartan). We will continue with this treatment plan.  -HYPERLIPIDEMIA: Your cholesterol levels have significantly improved with your current medications (Lipitor and Repatha). We will continue with this treatment plan.  -DEPRESSION: Your mood has been stable with your current medications (Depakote and Seroquel). We will continue with this treatment plan.  -PROSTATE AND ERECTILE DYSFUNCTION: You've noted improvement in both prostate issues and erectile dysfunction with daily Cialis. We will continue with this treatment plan.  -HERPES SIMPLEX VIRUS: You've had no recent outbreaks of herpes simplex virus while taking Valtrex. We will continue with this treatment plan.  -GENERAL HEALTH MAINTENANCE: We administered the Tdap vaccine during your visit. You plan to get your flu shot at an external location. We also checked your kidney function due to the potential impact of your medications. We will follow up in six months.  INSTRUCTIONS:  Please continue taking your medications as prescribed. Remember to get your flu shot tomorrow. We will check your kidney function today due to the potential impact of your medications. We will follow up in six months.

## 2023-02-23 NOTE — Assessment & Plan Note (Signed)
  Well controlled on Amlodipine 10mg , Metoprolol 25mg , and Valsartan 160mg . -Continue current regimen.

## 2023-02-26 ENCOUNTER — Ambulatory Visit: Payer: BC Managed Care – PPO | Admitting: Family

## 2023-03-13 DIAGNOSIS — F4312 Post-traumatic stress disorder, chronic: Secondary | ICD-10-CM | POA: Diagnosis not present

## 2023-03-13 DIAGNOSIS — F4322 Adjustment disorder with anxiety: Secondary | ICD-10-CM | POA: Diagnosis not present

## 2023-03-23 ENCOUNTER — Other Ambulatory Visit: Payer: Self-pay | Admitting: Family

## 2023-03-27 ENCOUNTER — Ambulatory Visit: Payer: BC Managed Care – PPO | Admitting: Family

## 2023-03-27 ENCOUNTER — Encounter: Payer: Self-pay | Admitting: Family

## 2023-03-27 VITALS — BP 152/90 | HR 69 | Temp 98.0°F | Resp 16 | Ht 70.5 in | Wt 207.0 lb

## 2023-03-27 DIAGNOSIS — Z125 Encounter for screening for malignant neoplasm of prostate: Secondary | ICD-10-CM

## 2023-03-27 DIAGNOSIS — Z0000 Encounter for general adult medical examination without abnormal findings: Secondary | ICD-10-CM

## 2023-03-27 DIAGNOSIS — I10 Essential (primary) hypertension: Secondary | ICD-10-CM

## 2023-03-27 DIAGNOSIS — E785 Hyperlipidemia, unspecified: Secondary | ICD-10-CM

## 2023-03-27 LAB — COMPREHENSIVE METABOLIC PANEL
ALT: 36 U/L (ref 0–53)
AST: 25 U/L (ref 0–37)
Albumin: 4.3 g/dL (ref 3.5–5.2)
Alkaline Phosphatase: 38 U/L — ABNORMAL LOW (ref 39–117)
BUN: 14 mg/dL (ref 6–23)
CO2: 27 meq/L (ref 19–32)
Calcium: 9.2 mg/dL (ref 8.4–10.5)
Chloride: 102 meq/L (ref 96–112)
Creatinine, Ser: 0.87 mg/dL (ref 0.40–1.50)
GFR: 92.62 mL/min (ref >60.00)
Glucose, Bld: 102 mg/dL — ABNORMAL HIGH (ref 70–99)
Potassium: 4.3 meq/L (ref 3.5–5.1)
Sodium: 138 meq/L (ref 135–145)
Total Bilirubin: 0.4 mg/dL (ref 0.2–1.2)
Total Protein: 6.7 g/dL (ref 6.0–8.3)

## 2023-03-27 LAB — LIPID PANEL
Cholesterol: 109 mg/dL (ref 0–200)
HDL: 41.2 mg/dL (ref >39.00)
LDL Cholesterol: 18 mg/dL (ref 0–99)
NonHDL: 67.34
Total CHOL/HDL Ratio: 3
Triglycerides: 248 mg/dL — ABNORMAL HIGH (ref 0.0–149.0)
VLDL: 49.6 mg/dL — ABNORMAL HIGH (ref 0.0–40.0)

## 2023-03-27 LAB — PSA: PSA: 1.33 ng/mL (ref 0.10–4.00)

## 2023-03-27 MED ORDER — METOPROLOL SUCCINATE ER 50 MG PO TB24: 50.0000 mg | ORAL_TABLET | Freq: Every day | ORAL | 1 refills | Status: DC

## 2023-03-27 NOTE — Progress Notes (Signed)
Subjective:     Patient ID: Francisco Lopez, male    DOB: Oct 24, 1960, 62 y.o.   MRN: [id number]  Chief Complaint  Patient presents with   Annual Exam    HPI  Discussed the use of AI scribe software for clinical note transcription with the patient, who gave verbal consent to proceed.  History of Present Illness          Patient presents today for complete physical.  Immunizations:tdap  Diet: reports diet needs improvement Exercise: walking in the afternoons Wt Readings from Last 3 Encounters:  03/27/23 207 lb (93.9 kg)  02/23/23 205 lb (93 kg)  09/27/22 205 lb (93 kg)  Colonoscopy: 2022 PSA:  Lab Results  Component Value Date   PSA 1.29 08/27/2017  Vision: up to date Dental: up to date  There are no preventive care reminders to display for this patient.  Past Medical History:  Diagnosis Date   Anxiety    Atypical chest pain 08/01/2019   Basal cell carcinoma 2006   BPH with obstruction/lower urinary tract symptoms 10/19/2014   Carpal tunnel syndrome 02/07/2016   Chronic rhinitis 07/24/2016   Depression    Diastolic dysfunction 02/04/2015   Per 2D echo 8/16   ED (erectile dysfunction) of organic origin 09/16/2014   Elevated coronary artery calcium score 02/06/2020   Frozen shoulder 2018   left   Gastritis    GERD (gastroesophageal reflux disease) 12/27/2015   Herpes simplex type 1 antibody positive 01/28/2015   Herpes simplex type 2 infection 01/28/2015   History of chicken pox    History of obstructive sleep apnea 10/29/2015   HTN (hypertension) 08/05/2014   Hyperlipidemia    Hypertension    Left shoulder pain 02/20/2017   Mood disorder (HCC) 08/25/2016   Preventative health care 09/16/2014    Past Surgical History:  Procedure Laterality Date   COLONOSCOPY     around 2015 [facility] in [place]   KNEE SURGERY Left 2000   meninscus repair   SHOULDER ARTHROSCOPY Left 01/2018   UPPER GASTROINTESTINAL ENDOSCOPY      Family History  Problem  Relation Age of Onset   Diabetes Mother        type II   Anxiety disorder Mother    Hypertension Father    Alcohol abuse Father    Liver cancer Sister    Hemachromatosis Sister    COPD Sister        died from covid complication   Diabetes Mellitus II Sister    Hemachromatosis Brother    Liver cancer Brother    CAD Brother        smoker   CAD Brother        smoker   CAD Paternal Grandmother    COPD Paternal Grandfather    Colon cancer Neg Hx    Esophageal cancer Neg Hx    Stomach cancer Neg Hx    Rectal cancer Neg Hx     Social History   Socioeconomic History   Marital status: Divorced    Spouse name: Not on file   Number of children: Not on file   Years of education: Not on file   Highest education level: 8th grade  Occupational History   Not on file  Tobacco Use   Smoking status: Never   Smokeless tobacco: Never  Vaping Use   Vaping status: Never Used  Substance and Sexual Activity   Alcohol use: No   Drug use: No   Sexual activity: Yes  Other Topics Concern   Not on file  Social History Narrative   Separated   1 child (77 yr old daughter)- lives in [place]   Drives 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[employer], 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   Social Determinants of Health   Financial Resource Strain: Low Risk  (09/27/2022)   Overall Financial Resource Strain (CARDIA)    Difficulty of Paying Living Expenses: Not hard at all  Food Insecurity: No Food Insecurity (09/27/2022)   Hunger Vital Sign    Worried About Running Out of Food in the Last Year: Never true    Ran Out of Food in the Last Year: Never true  Transportation Needs: Unknown (09/27/2022)   PRAPARE - Administrator, Civil Service (Medical): Not on file    Lack of Transportation (Non-Medical): Patient declined  Physical Activity: Insufficiently Active (09/27/2022)   Exercise Vital Sign    Days of Exercise per Week: 3 days    Minutes of Exercise per Session: 30 min  Stress: No Stress Concern Present  (09/27/2022)   [employer] of Occupational Health - Occupational Stress Questionnaire    Feeling of Stress : Not at all  Social Connections: Not on file  Intimate Partner Violence: Not on file    Outpatient Medications Prior to Visit  Medication Sig Dispense Refill   amLODipine (NORVASC) 10 MG tablet Take 1 tablet (10 mg total) by mouth daily. 90 tablet 1   aspirin EC 81 MG tablet Take 1 tablet (81 mg total) by mouth daily. Swallow whole. 90 tablet 3   atorvastatin (LIPITOR) 40 MG tablet Take 1 tablet (40 mg total) by mouth daily. 90 tablet 3   divalproex (DEPAKOTE) 250 MG DR tablet TAKE 2 TO 3 TABLETS BY MOUTH EVERY EVENING  12   Evolocumab (REPATHA) 140 MG/ML SOSY Inject 140 mg into the skin every 14 (fourteen) days. 2.1 mL 12   Multiple Vitamins-Minerals (MENS MULTIVITAMIN PLUS PO) Take 1 tablet by mouth daily in the afternoon.     Omega-3 Fatty Acids (FISH OIL) 1000 MG CAPS Take 2 capsules (2,000 mg total) by mouth 2 (two) times daily. 180 capsule 3   pantoprazole (PROTONIX) 20 MG tablet TAKE 1 TABLET BY MOUTH EVERY DAY 90 tablet 0   QUEtiapine (SEROQUEL) 400 MG tablet Take 400 mg by mouth at bedtime.     tadalafil (CIALIS) 5 MG tablet Take one tablet once daily.  If needed may take 5 mg additional dose prior to intercourse 90 tablet 1   valACYclovir (VALTREX) 500 MG tablet Take 1 tablet (500 mg total) by mouth daily. 90 tablet 1   valsartan (DIOVAN) 160 MG tablet TAKE 1 TABLET BY MOUTH EVERY DAY 90 tablet 1   metoprolol succinate (TOPROL XL) 25 MG 24 hr tablet Take 1 tablet (25 mg total) by mouth daily. 90 tablet 3   No facility-administered medications prior to visit.    No Known Allergies  Review of Systems  Constitutional:  Negative for weight loss.  HENT:  Negative for congestion and hearing loss.   Eyes:  Negative for blurred vision.  Respiratory:  Negative for cough.   Cardiovascular:  Negative for leg swelling.  Gastrointestinal:  Negative for constipation and  diarrhea.  Genitourinary:  Negative for frequency.  Musculoskeletal:  Negative for joint pain and myalgias.  Skin:  Negative for rash.  Neurological:  Negative for headaches.  Psychiatric/Behavioral:         Mood is stable, followed by Dr. Evelene Croon  Objective:    Physical Exam   BP (!) 152/90   Pulse 69   Temp 98 F (36.7 C) (Oral)   Resp 16   Ht 5' 10.5" (1.791 m)   Wt 207 lb (93.9 kg)   SpO2 97%   BMI 29.28 kg/m  Wt Readings from Last 3 Encounters:  03/27/23 207 lb (93.9 kg)  02/23/23 205 lb (93 kg)  09/27/22 205 lb (93 kg)   Physical Exam  Constitutional: He is oriented to person, place, and time. He appears well-developed and well-nourished. No distress.  HENT:  Head: Normocephalic and atraumatic.  Right Ear: Tympanic membrane and ear canal normal.  Left Ear: Tympanic membrane and ear canal normal.  Mouth/Throat: Oropharynx is clear and moist.  Eyes: Pupils are equal, round, and reactive to light. No scleral icterus.  Neck: Normal range of motion. No thyromegaly present.  Cardiovascular: Normal rate and regular rhythm.   No murmur heard. Pulmonary/Chest: Effort normal and breath sounds normal. No respiratory distress. He has no wheezes. He has no rales. He exhibits no tenderness.  Abdominal: Soft. Bowel sounds are normal. He exhibits no distension and no mass. There is no tenderness. There is no rebound and no guarding.  Musculoskeletal: He exhibits no edema.  Lymphadenopathy:    He has no cervical adenopathy.  Neurological: He is alert and oriented to person, place, and time. He has normal patellar reflexes. He exhibits normal muscle tone. Coordination normal.  Skin: Skin is warm and dry.  Psychiatric: He has a normal mood and affect. His behavior is normal. Judgment and thought content normal.           Assessment & Plan:       Assessment & Plan:   Problem List Items Addressed This Visit       Unprioritized   Preventative health care - Primary     Continue to work on healthy diet and regular exercise.  Colo up to date. Declines covid vaccine. Other vaccines up to date.       Hyperlipidemia    Maintained on repatha per cardiology.       Relevant Medications   metoprolol succinate (TOPROL XL) 50 MG 24 hr tablet   Other Relevant Orders   Comp Met (CMET)   Lipid panel   HTN (hypertension)    Uncontrolled. Will increase toprol xl from 25mg  to 50mg  once daily. Continue valsartan. He has an upcoming appointment with cardiology for BP recheck. Also, he will send me his updated readings in 1 week via mychart.       Relevant Medications   metoprolol succinate (TOPROL XL) 50 MG 24 hr tablet   Other Visit Diagnoses     Screening for prostate cancer       Relevant Orders   PSA       I have discontinued Dimitriy Spooner's metoprolol succinate. I am also having him start on metoprolol succinate. Additionally, I am having him maintain his Multiple Vitamins-Minerals (MENS MULTIVITAMIN PLUS PO), divalproex, aspirin EC, Fish Oil, QUEtiapine, Repatha, atorvastatin, valACYclovir, tadalafil, amLODipine, pantoprazole, and valsartan.  Meds ordered this encounter  Medications   metoprolol succinate (TOPROL XL) 50 MG 24 hr tablet    Sig: Take 1 tablet (50 mg total) by mouth daily. Take with or immediately following a meal.    Dispense:  90 tablet    Refill:  1    Order Specific Question:   Supervising Provider    Answer:   Danise Edge A [[id number]]

## 2023-03-27 NOTE — Patient Instructions (Signed)
Please increase your toprol xl from 25mg  to 50mg  once daily. Check blood pressure once daily for 1 week and send me your readings via mychart.

## 2023-03-27 NOTE — Assessment & Plan Note (Addendum)
Uncontrolled. Will increase toprol xl from 25mg  to 50mg  once daily. Continue valsartan. He has an upcoming appointment with cardiology for BP recheck. Also, he will send me his updated readings in 1 week via mychart.

## 2023-03-27 NOTE — Assessment & Plan Note (Signed)
Continue to work on healthy diet and regular exercise.  Colo up to date. Declines covid vaccine. Other vaccines up to date.

## 2023-03-27 NOTE — Assessment & Plan Note (Signed)
Maintained on repatha per cardiology.

## 2023-03-30 ENCOUNTER — Ambulatory Visit: Payer: BC Managed Care – PPO | Admitting: Family

## 2023-04-02 ENCOUNTER — Other Ambulatory Visit: Payer: Self-pay

## 2023-04-03 ENCOUNTER — Ambulatory Visit: Payer: BC Managed Care – PPO | Admitting: Cardiology

## 2023-04-03 NOTE — Progress Notes (Unsigned)
Cardiology Office Note:    Date:  04/04/2023   ID:  Francisco Lopez, DOB [birth year], MRN [id number]  PCP:  [name], NP  Cardiologist:  Norman Herrlich, MD    Referring MD: [name], NP    ASSESSMENT:    1. Coronary artery disease of native artery of native heart with stable angina pectoris (HCC)   2. Elevated coronary artery calcium score   3. Elevated lipoprotein(a)   4. Primary hypertension   5. Mixed hyperlipidemia    PLAN:    In order of problems listed above:  He continues to do well with CAD no anginal discomfort continue aspirin beta-blocker and current lipid-lowering with high intensity statin and Repatha with elevated LP(a) BP at target I asked him to use good technique and if he frequently gets numbers greater than 140 to contact me currently he is taking both an ARB and calcium channel blocker. Continue high intensity statin Repatha   Next appointment: 1 year   Medication Adjustments/Labs and Tests Ordered: Current medicines are reviewed at length with the patient today.  Concerns regarding medicines are outlined above.  Orders Placed This Encounter  Procedures   EKG 12-Lead   No orders of the defined types were placed in this encounter.    History of Present Illness:    Francisco Lopez is a 62 y.o. male with a hx of CAD elevated coronary calcium score hypertension hyperlipidemia with elevated lipoprotein a last seen 04/04/2022.He had a cardiac CTA reported 02/21/2022 coronary calcium score of 920/90th percentile and moderate 50 to 69% stenosis of the right coronary artery.  FFR for right coronary artery normal.  Compliance with diet, lifestyle and medications: Yes  He continues to do well he walks approximately 45 minutes a day 6000 7000 steps careful with his diet Home blood pressure runs 130s to 140s systolic has no side effects from his lipid-lowering medications and has had no anginal discomfort edema shortness of breath palpitation or  syncope  His last lipid profile had an LDL of less than 25 I am going to have him reduce his statin to every other day continue Repatha Past Medical History:  Diagnosis Date   Anxiety    Basal cell carcinoma 2006   BPH with obstruction/lower urinary tract symptoms 10/19/2014   Carpal tunnel syndrome 02/07/2016   Chronic rhinitis 07/24/2016   Depression    Diastolic dysfunction 02/04/2015   Per 2D echo 8/16   ED (erectile dysfunction) of organic origin 09/16/2014   Elevated coronary artery calcium score 02/06/2020   Family history of early CAD 09/23/2021   Frozen shoulder 2018   left   Gastritis    GERD (gastroesophageal reflux disease) 12/27/2015   Herpes simplex type 1 antibody positive 01/28/2015   Herpes simplex type 2 infection 01/28/2015   History of chicken pox    History of obstructive sleep apnea 10/29/2015   HTN (hypertension) 08/05/2014   Hyperlipidemia    Left shoulder pain 02/20/2017   Mood disorder (HCC) 08/25/2016   Osteoarthritis of right hand 03/31/2022   Preventative health care 09/16/2014   Right inguinal pain 10/25/2020    Current Medications: Current Meds  Medication Sig   amLODipine (NORVASC) 10 MG tablet Take 1 tablet (10 mg total) by mouth daily.   aspirin EC 81 MG tablet Take 1 tablet (81 mg total) by mouth daily. Swallow whole.   atorvastatin (LIPITOR) 40 MG tablet Take 1 tablet (40 mg total) by mouth daily.   divalproex (DEPAKOTE ER) 500 MG 24  Cardiology Office Note:    Date:  04/04/2023   ID:  Francisco Lopez, DOB [birth year], MRN [id number]  PCP:  [name], NP  Cardiologist:  Norman Herrlich, MD    Referring MD: [name], NP    ASSESSMENT:    1. Coronary artery disease of native artery of native heart with stable angina pectoris (HCC)   2. Elevated coronary artery calcium score   3. Elevated lipoprotein(a)   4. Primary hypertension   5. Mixed hyperlipidemia    PLAN:    In order of problems listed above:  He continues to do well with CAD no anginal discomfort continue aspirin beta-blocker and current lipid-lowering with high intensity statin and Repatha with elevated LP(a) BP at target I asked him to use good technique and if he frequently gets numbers greater than 140 to contact me currently he is taking both an ARB and calcium channel blocker. Continue high intensity statin Repatha   Next appointment: 1 year   Medication Adjustments/Labs and Tests Ordered: Current medicines are reviewed at length with the patient today.  Concerns regarding medicines are outlined above.  Orders Placed This Encounter  Procedures   EKG 12-Lead   No orders of the defined types were placed in this encounter.    History of Present Illness:    Francisco Lopez is a 62 y.o. male with a hx of CAD elevated coronary calcium score hypertension hyperlipidemia with elevated lipoprotein a last seen 04/04/2022.He had a cardiac CTA reported 02/21/2022 coronary calcium score of 920/90th percentile and moderate 50 to 69% stenosis of the right coronary artery.  FFR for right coronary artery normal.  Compliance with diet, lifestyle and medications: Yes  He continues to do well he walks approximately 45 minutes a day 6000 7000 steps careful with his diet Home blood pressure runs 130s to 140s systolic has no side effects from his lipid-lowering medications and has had no anginal discomfort edema shortness of breath palpitation or  syncope  His last lipid profile had an LDL of less than 25 I am going to have him reduce his statin to every other day continue Repatha Past Medical History:  Diagnosis Date   Anxiety    Basal cell carcinoma 2006   BPH with obstruction/lower urinary tract symptoms 10/19/2014   Carpal tunnel syndrome 02/07/2016   Chronic rhinitis 07/24/2016   Depression    Diastolic dysfunction 02/04/2015   Per 2D echo 8/16   ED (erectile dysfunction) of organic origin 09/16/2014   Elevated coronary artery calcium score 02/06/2020   Family history of early CAD 09/23/2021   Frozen shoulder 2018   left   Gastritis    GERD (gastroesophageal reflux disease) 12/27/2015   Herpes simplex type 1 antibody positive 01/28/2015   Herpes simplex type 2 infection 01/28/2015   History of chicken pox    History of obstructive sleep apnea 10/29/2015   HTN (hypertension) 08/05/2014   Hyperlipidemia    Left shoulder pain 02/20/2017   Mood disorder (HCC) 08/25/2016   Osteoarthritis of right hand 03/31/2022   Preventative health care 09/16/2014   Right inguinal pain 10/25/2020    Current Medications: Current Meds  Medication Sig   amLODipine (NORVASC) 10 MG tablet Take 1 tablet (10 mg total) by mouth daily.   aspirin EC 81 MG tablet Take 1 tablet (81 mg total) by mouth daily. Swallow whole.   atorvastatin (LIPITOR) 40 MG tablet Take 1 tablet (40 mg total) by mouth daily.   divalproex (DEPAKOTE ER) 500 MG 24  filling pressure, aortic valve sclerosis, trivial MR/TR, normal RVSP.  Transthoracic echocardiography.  M-mode, complete 2D, spectral Doppler, and color Doppler.  Birthdate:  Patient birthdate: 1960-12-21.  Age:  Patient is 62 yr old.  Sex:  Gender: male. BMI: 26.9 kg/m^2.  Blood pressure:     140/84  Patient status: Outpatient.  Study date:  Study date: 02/03/2015. Study time: 11:13 AM.  Location:  Echo laboratory.  -------------------------------------------------------------------  ------------------------------------------------------------------- Left ventricle:  The cavity size was normal. There was mild concentric hypertrophy. Systolic  function was normal. The estimated ejection fraction was in the range of 60% to 65%. Wall motion was normal; there were no regional wall motion abnormalities. Doppler parameters are consistent with abnormal left ventricular relaxation (grade 1 diastolic dysfunction). The E/e&' ratio is between 8-15, suggesting indeterminate LV filling pressure.  ------------------------------------------------------------------- Aortic valve:  Trileaflet. Sclerosis without stenosis.  Doppler: There was no regurgitation.  ------------------------------------------------------------------- Aorta:  Aortic root: The aortic root was normal in size. Ascending aorta: The ascending aorta was normal in size.  ------------------------------------------------------------------- Mitral valve:   Calcified annulus.  Doppler:  There was trivial regurgitation.  ------------------------------------------------------------------- Left atrium:  Moderately dilated at 47 ml/m2.  ------------------------------------------------------------------- Atrial septum:  No defect or patent foramen ovale was identified.  ------------------------------------------------------------------- Right ventricle:  The cavity size was mildly dilated. Systolic function was normal.  ------------------------------------------------------------------- Pulmonic valve:    The valve appears to be grossly normal. Doppler:  There was no significant regurgitation.  ------------------------------------------------------------------- Tricuspid valve:   Doppler:  There was trivial regurgitation.  ------------------------------------------------------------------- Pulmonary artery:   The main pulmonary artery was normal-sized.  ------------------------------------------------------------------- Right atrium:  The atrium was mildly dilated.  ------------------------------------------------------------------- Pericardium:  There was no  pericardial effusion.  ------------------------------------------------------------------- Systemic veins: Inferior vena cava: The vessel was normal in size. The respirophasic diameter changes were in the normal range (>= 50%), consistent with normal central venous pressure.  ------------------------------------------------------------------- Measurements  Left ventricle                           Value        Reference LV ID, ED, PLAX chordal          (L)     41    mm     43 - 52 LV ID, ES, PLAX chordal                  25.1  mm     23 - 38 LV fx shortening, PLAX chordal           39    %      >=29 LV PW thickness, ED                      11.7  mm     --------- IVS/LV PW ratio, ED                      0.88         <=1.3 LV ejection fraction, 1-p A4C            64    %      --------- LV end-diastolic volume, 2-p             190   ml     --------- LV end-systolic volume, 2-p              70    ml     ---------  Cardiology Office Note:    Date:  04/04/2023   ID:  Francisco Lopez, DOB [birth year], MRN [id number]  PCP:  [name], NP  Cardiologist:  Norman Herrlich, MD    Referring MD: [name], NP    ASSESSMENT:    1. Coronary artery disease of native artery of native heart with stable angina pectoris (HCC)   2. Elevated coronary artery calcium score   3. Elevated lipoprotein(a)   4. Primary hypertension   5. Mixed hyperlipidemia    PLAN:    In order of problems listed above:  He continues to do well with CAD no anginal discomfort continue aspirin beta-blocker and current lipid-lowering with high intensity statin and Repatha with elevated LP(a) BP at target I asked him to use good technique and if he frequently gets numbers greater than 140 to contact me currently he is taking both an ARB and calcium channel blocker. Continue high intensity statin Repatha   Next appointment: 1 year   Medication Adjustments/Labs and Tests Ordered: Current medicines are reviewed at length with the patient today.  Concerns regarding medicines are outlined above.  Orders Placed This Encounter  Procedures   EKG 12-Lead   No orders of the defined types were placed in this encounter.    History of Present Illness:    Francisco Lopez is a 62 y.o. male with a hx of CAD elevated coronary calcium score hypertension hyperlipidemia with elevated lipoprotein a last seen 04/04/2022.He had a cardiac CTA reported 02/21/2022 coronary calcium score of 920/90th percentile and moderate 50 to 69% stenosis of the right coronary artery.  FFR for right coronary artery normal.  Compliance with diet, lifestyle and medications: Yes  He continues to do well he walks approximately 45 minutes a day 6000 7000 steps careful with his diet Home blood pressure runs 130s to 140s systolic has no side effects from his lipid-lowering medications and has had no anginal discomfort edema shortness of breath palpitation or  syncope  His last lipid profile had an LDL of less than 25 I am going to have him reduce his statin to every other day continue Repatha Past Medical History:  Diagnosis Date   Anxiety    Basal cell carcinoma 2006   BPH with obstruction/lower urinary tract symptoms 10/19/2014   Carpal tunnel syndrome 02/07/2016   Chronic rhinitis 07/24/2016   Depression    Diastolic dysfunction 02/04/2015   Per 2D echo 8/16   ED (erectile dysfunction) of organic origin 09/16/2014   Elevated coronary artery calcium score 02/06/2020   Family history of early CAD 09/23/2021   Frozen shoulder 2018   left   Gastritis    GERD (gastroesophageal reflux disease) 12/27/2015   Herpes simplex type 1 antibody positive 01/28/2015   Herpes simplex type 2 infection 01/28/2015   History of chicken pox    History of obstructive sleep apnea 10/29/2015   HTN (hypertension) 08/05/2014   Hyperlipidemia    Left shoulder pain 02/20/2017   Mood disorder (HCC) 08/25/2016   Osteoarthritis of right hand 03/31/2022   Preventative health care 09/16/2014   Right inguinal pain 10/25/2020    Current Medications: Current Meds  Medication Sig   amLODipine (NORVASC) 10 MG tablet Take 1 tablet (10 mg total) by mouth daily.   aspirin EC 81 MG tablet Take 1 tablet (81 mg total) by mouth daily. Swallow whole.   atorvastatin (LIPITOR) 40 MG tablet Take 1 tablet (40 mg total) by mouth daily.   divalproex (DEPAKOTE ER) 500 MG 24  filling pressure, aortic valve sclerosis, trivial MR/TR, normal RVSP.  Transthoracic echocardiography.  M-mode, complete 2D, spectral Doppler, and color Doppler.  Birthdate:  Patient birthdate: 1960-12-21.  Age:  Patient is 62 yr old.  Sex:  Gender: male. BMI: 26.9 kg/m^2.  Blood pressure:     140/84  Patient status: Outpatient.  Study date:  Study date: 02/03/2015. Study time: 11:13 AM.  Location:  Echo laboratory.  -------------------------------------------------------------------  ------------------------------------------------------------------- Left ventricle:  The cavity size was normal. There was mild concentric hypertrophy. Systolic  function was normal. The estimated ejection fraction was in the range of 60% to 65%. Wall motion was normal; there were no regional wall motion abnormalities. Doppler parameters are consistent with abnormal left ventricular relaxation (grade 1 diastolic dysfunction). The E/e&' ratio is between 8-15, suggesting indeterminate LV filling pressure.  ------------------------------------------------------------------- Aortic valve:  Trileaflet. Sclerosis without stenosis.  Doppler: There was no regurgitation.  ------------------------------------------------------------------- Aorta:  Aortic root: The aortic root was normal in size. Ascending aorta: The ascending aorta was normal in size.  ------------------------------------------------------------------- Mitral valve:   Calcified annulus.  Doppler:  There was trivial regurgitation.  ------------------------------------------------------------------- Left atrium:  Moderately dilated at 47 ml/m2.  ------------------------------------------------------------------- Atrial septum:  No defect or patent foramen ovale was identified.  ------------------------------------------------------------------- Right ventricle:  The cavity size was mildly dilated. Systolic function was normal.  ------------------------------------------------------------------- Pulmonic valve:    The valve appears to be grossly normal. Doppler:  There was no significant regurgitation.  ------------------------------------------------------------------- Tricuspid valve:   Doppler:  There was trivial regurgitation.  ------------------------------------------------------------------- Pulmonary artery:   The main pulmonary artery was normal-sized.  ------------------------------------------------------------------- Right atrium:  The atrium was mildly dilated.  ------------------------------------------------------------------- Pericardium:  There was no  pericardial effusion.  ------------------------------------------------------------------- Systemic veins: Inferior vena cava: The vessel was normal in size. The respirophasic diameter changes were in the normal range (>= 50%), consistent with normal central venous pressure.  ------------------------------------------------------------------- Measurements  Left ventricle                           Value        Reference LV ID, ED, PLAX chordal          (L)     41    mm     43 - 52 LV ID, ES, PLAX chordal                  25.1  mm     23 - 38 LV fx shortening, PLAX chordal           39    %      >=29 LV PW thickness, ED                      11.7  mm     --------- IVS/LV PW ratio, ED                      0.88         <=1.3 LV ejection fraction, 1-p A4C            64    %      --------- LV end-diastolic volume, 2-p             190   ml     --------- LV end-systolic volume, 2-p              70    ml     ---------  Cardiology Office Note:    Date:  04/04/2023   ID:  Francisco Lopez, DOB [birth year], MRN [id number]  PCP:  [name], NP  Cardiologist:  Norman Herrlich, MD    Referring MD: [name], NP    ASSESSMENT:    1. Coronary artery disease of native artery of native heart with stable angina pectoris (HCC)   2. Elevated coronary artery calcium score   3. Elevated lipoprotein(a)   4. Primary hypertension   5. Mixed hyperlipidemia    PLAN:    In order of problems listed above:  He continues to do well with CAD no anginal discomfort continue aspirin beta-blocker and current lipid-lowering with high intensity statin and Repatha with elevated LP(a) BP at target I asked him to use good technique and if he frequently gets numbers greater than 140 to contact me currently he is taking both an ARB and calcium channel blocker. Continue high intensity statin Repatha   Next appointment: 1 year   Medication Adjustments/Labs and Tests Ordered: Current medicines are reviewed at length with the patient today.  Concerns regarding medicines are outlined above.  Orders Placed This Encounter  Procedures   EKG 12-Lead   No orders of the defined types were placed in this encounter.    History of Present Illness:    Francisco Lopez is a 62 y.o. male with a hx of CAD elevated coronary calcium score hypertension hyperlipidemia with elevated lipoprotein a last seen 04/04/2022.He had a cardiac CTA reported 02/21/2022 coronary calcium score of 920/90th percentile and moderate 50 to 69% stenosis of the right coronary artery.  FFR for right coronary artery normal.  Compliance with diet, lifestyle and medications: Yes  He continues to do well he walks approximately 45 minutes a day 6000 7000 steps careful with his diet Home blood pressure runs 130s to 140s systolic has no side effects from his lipid-lowering medications and has had no anginal discomfort edema shortness of breath palpitation or  syncope  His last lipid profile had an LDL of less than 25 I am going to have him reduce his statin to every other day continue Repatha Past Medical History:  Diagnosis Date   Anxiety    Basal cell carcinoma 2006   BPH with obstruction/lower urinary tract symptoms 10/19/2014   Carpal tunnel syndrome 02/07/2016   Chronic rhinitis 07/24/2016   Depression    Diastolic dysfunction 02/04/2015   Per 2D echo 8/16   ED (erectile dysfunction) of organic origin 09/16/2014   Elevated coronary artery calcium score 02/06/2020   Family history of early CAD 09/23/2021   Frozen shoulder 2018   left   Gastritis    GERD (gastroesophageal reflux disease) 12/27/2015   Herpes simplex type 1 antibody positive 01/28/2015   Herpes simplex type 2 infection 01/28/2015   History of chicken pox    History of obstructive sleep apnea 10/29/2015   HTN (hypertension) 08/05/2014   Hyperlipidemia    Left shoulder pain 02/20/2017   Mood disorder (HCC) 08/25/2016   Osteoarthritis of right hand 03/31/2022   Preventative health care 09/16/2014   Right inguinal pain 10/25/2020    Current Medications: Current Meds  Medication Sig   amLODipine (NORVASC) 10 MG tablet Take 1 tablet (10 mg total) by mouth daily.   aspirin EC 81 MG tablet Take 1 tablet (81 mg total) by mouth daily. Swallow whole.   atorvastatin (LIPITOR) 40 MG tablet Take 1 tablet (40 mg total) by mouth daily.   divalproex (DEPAKOTE ER) 500 MG 24  filling pressure, aortic valve sclerosis, trivial MR/TR, normal RVSP.  Transthoracic echocardiography.  M-mode, complete 2D, spectral Doppler, and color Doppler.  Birthdate:  Patient birthdate: 1960-12-21.  Age:  Patient is 62 yr old.  Sex:  Gender: male. BMI: 26.9 kg/m^2.  Blood pressure:     140/84  Patient status: Outpatient.  Study date:  Study date: 02/03/2015. Study time: 11:13 AM.  Location:  Echo laboratory.  -------------------------------------------------------------------  ------------------------------------------------------------------- Left ventricle:  The cavity size was normal. There was mild concentric hypertrophy. Systolic  function was normal. The estimated ejection fraction was in the range of 60% to 65%. Wall motion was normal; there were no regional wall motion abnormalities. Doppler parameters are consistent with abnormal left ventricular relaxation (grade 1 diastolic dysfunction). The E/e&' ratio is between 8-15, suggesting indeterminate LV filling pressure.  ------------------------------------------------------------------- Aortic valve:  Trileaflet. Sclerosis without stenosis.  Doppler: There was no regurgitation.  ------------------------------------------------------------------- Aorta:  Aortic root: The aortic root was normal in size. Ascending aorta: The ascending aorta was normal in size.  ------------------------------------------------------------------- Mitral valve:   Calcified annulus.  Doppler:  There was trivial regurgitation.  ------------------------------------------------------------------- Left atrium:  Moderately dilated at 47 ml/m2.  ------------------------------------------------------------------- Atrial septum:  No defect or patent foramen ovale was identified.  ------------------------------------------------------------------- Right ventricle:  The cavity size was mildly dilated. Systolic function was normal.  ------------------------------------------------------------------- Pulmonic valve:    The valve appears to be grossly normal. Doppler:  There was no significant regurgitation.  ------------------------------------------------------------------- Tricuspid valve:   Doppler:  There was trivial regurgitation.  ------------------------------------------------------------------- Pulmonary artery:   The main pulmonary artery was normal-sized.  ------------------------------------------------------------------- Right atrium:  The atrium was mildly dilated.  ------------------------------------------------------------------- Pericardium:  There was no  pericardial effusion.  ------------------------------------------------------------------- Systemic veins: Inferior vena cava: The vessel was normal in size. The respirophasic diameter changes were in the normal range (>= 50%), consistent with normal central venous pressure.  ------------------------------------------------------------------- Measurements  Left ventricle                           Value        Reference LV ID, ED, PLAX chordal          (L)     41    mm     43 - 52 LV ID, ES, PLAX chordal                  25.1  mm     23 - 38 LV fx shortening, PLAX chordal           39    %      >=29 LV PW thickness, ED                      11.7  mm     --------- IVS/LV PW ratio, ED                      0.88         <=1.3 LV ejection fraction, 1-p A4C            64    %      --------- LV end-diastolic volume, 2-p             190   ml     --------- LV end-systolic volume, 2-p              70    ml     ---------  Cardiology Office Note:    Date:  04/04/2023   ID:  Francisco Lopez, DOB [birth year], MRN [id number]  PCP:  [name], NP  Cardiologist:  Norman Herrlich, MD    Referring MD: [name], NP    ASSESSMENT:    1. Coronary artery disease of native artery of native heart with stable angina pectoris (HCC)   2. Elevated coronary artery calcium score   3. Elevated lipoprotein(a)   4. Primary hypertension   5. Mixed hyperlipidemia    PLAN:    In order of problems listed above:  He continues to do well with CAD no anginal discomfort continue aspirin beta-blocker and current lipid-lowering with high intensity statin and Repatha with elevated LP(a) BP at target I asked him to use good technique and if he frequently gets numbers greater than 140 to contact me currently he is taking both an ARB and calcium channel blocker. Continue high intensity statin Repatha   Next appointment: 1 year   Medication Adjustments/Labs and Tests Ordered: Current medicines are reviewed at length with the patient today.  Concerns regarding medicines are outlined above.  Orders Placed This Encounter  Procedures   EKG 12-Lead   No orders of the defined types were placed in this encounter.    History of Present Illness:    Francisco Lopez is a 62 y.o. male with a hx of CAD elevated coronary calcium score hypertension hyperlipidemia with elevated lipoprotein a last seen 04/04/2022.He had a cardiac CTA reported 02/21/2022 coronary calcium score of 920/90th percentile and moderate 50 to 69% stenosis of the right coronary artery.  FFR for right coronary artery normal.  Compliance with diet, lifestyle and medications: Yes  He continues to do well he walks approximately 45 minutes a day 6000 7000 steps careful with his diet Home blood pressure runs 130s to 140s systolic has no side effects from his lipid-lowering medications and has had no anginal discomfort edema shortness of breath palpitation or  syncope  His last lipid profile had an LDL of less than 25 I am going to have him reduce his statin to every other day continue Repatha Past Medical History:  Diagnosis Date   Anxiety    Basal cell carcinoma 2006   BPH with obstruction/lower urinary tract symptoms 10/19/2014   Carpal tunnel syndrome 02/07/2016   Chronic rhinitis 07/24/2016   Depression    Diastolic dysfunction 02/04/2015   Per 2D echo 8/16   ED (erectile dysfunction) of organic origin 09/16/2014   Elevated coronary artery calcium score 02/06/2020   Family history of early CAD 09/23/2021   Frozen shoulder 2018   left   Gastritis    GERD (gastroesophageal reflux disease) 12/27/2015   Herpes simplex type 1 antibody positive 01/28/2015   Herpes simplex type 2 infection 01/28/2015   History of chicken pox    History of obstructive sleep apnea 10/29/2015   HTN (hypertension) 08/05/2014   Hyperlipidemia    Left shoulder pain 02/20/2017   Mood disorder (HCC) 08/25/2016   Osteoarthritis of right hand 03/31/2022   Preventative health care 09/16/2014   Right inguinal pain 10/25/2020    Current Medications: Current Meds  Medication Sig   amLODipine (NORVASC) 10 MG tablet Take 1 tablet (10 mg total) by mouth daily.   aspirin EC 81 MG tablet Take 1 tablet (81 mg total) by mouth daily. Swallow whole.   atorvastatin (LIPITOR) 40 MG tablet Take 1 tablet (40 mg total) by mouth daily.   divalproex (DEPAKOTE ER) 500 MG 24

## 2023-04-04 ENCOUNTER — Encounter: Payer: Self-pay | Admitting: Cardiology

## 2023-04-04 ENCOUNTER — Ambulatory Visit: Payer: BC Managed Care – PPO | Attending: Cardiology | Admitting: Cardiology

## 2023-04-04 VITALS — BP 140/70 | HR 69 | Ht 70.0 in | Wt 208.8 lb

## 2023-04-04 DIAGNOSIS — I25118 Atherosclerotic heart disease of native coronary artery with other forms of angina pectoris: Secondary | ICD-10-CM

## 2023-04-04 DIAGNOSIS — E7841 Elevated Lipoprotein(a): Secondary | ICD-10-CM | POA: Diagnosis not present

## 2023-04-04 DIAGNOSIS — R931 Abnormal findings on diagnostic imaging of heart and coronary circulation: Secondary | ICD-10-CM

## 2023-04-04 DIAGNOSIS — I10 Essential (primary) hypertension: Secondary | ICD-10-CM | POA: Diagnosis not present

## 2023-04-04 DIAGNOSIS — E782 Mixed hyperlipidemia: Secondary | ICD-10-CM

## 2023-04-04 MED ORDER — ATORVASTATIN CALCIUM 40 MG PO TABS: 40.0000 mg | ORAL_TABLET | ORAL | 3 refills | Status: DC

## 2023-04-04 NOTE — Addendum Note (Signed)
Addended by: Roxanne Mins I on: 04/04/2023 08:55 AM   Modules accepted: Orders

## 2023-04-04 NOTE — Patient Instructions (Addendum)
Medication Instructions:  Your physician has recommended you make the following change in your medication:   START: Lipitor 40 mg every other day  *If you need a refill on your cardiac medications before your next appointment, please call your pharmacy*   Lab Work: None If you have labs (blood work) drawn today and your tests are completely normal, you will receive your results only by: MyChart Message (if you have MyChart) OR A paper copy in the mail If you have any lab test that is abnormal or we need to change your treatment, we will call you to review the results.   Testing/Procedures: None   Follow-Up: At [facility], you and your health needs are our priority.  As part of our continuing mission to provide you with exceptional heart care, we have created designated Provider Care Teams.  These Care Teams include your primary Cardiologist (physician) and Advanced Practice Providers (APPs -  Physician Assistants and Nurse Practitioners) who all work together to provide you with the care you need, when you need it.  We recommend signing up for the patient portal called "MyChart".  Sign up information is provided on this After Visit Summary.  MyChart is used to connect with patients for Virtual Visits (Telemedicine).  Patients are able to view lab/test results, encounter notes, upcoming appointments, etc.  Non-urgent messages can be sent to your provider as well.   To learn more about what you can do with MyChart, go to [email or web address].    Your next appointment:   1 year(s)  Provider:   Norman Herrlich, MD    Other Instructions Notify if systolic blood pressures are often > 140    [facility]  Tips to measure your blood pressure correctly  To determine whether you have hypertension, a medical professional will take a blood pressure reading. How you prepare for the test, the position of your arm, and other factors can change a blood pressure reading by 10% or  more. That could be enough to hide high blood pressure, start you on a drug you don't really need, or lead your doctor to incorrectly adjust your medications. National and international guidelines offer specific instructions for measuring blood pressure. If a doctor, nurse, or medical assistant isn't doing it right, don't hesitate to ask him or her to get with the guidelines. Here's what you can do to ensure a correct reading:  Don't drink a caffeinated beverage or smoke during the 30 minutes before the test.  Sit quietly for five minutes before the test begins.  During the measurement, sit in a chair with your feet on the floor and your arm supported so your elbow is at about heart level.  The inflatable part of the cuff should completely cover at least 80% of your upper arm, and the cuff should be placed on bare skin, not over a shirt.  Don't talk during the measurement.  Have your blood pressure measured twice, with a brief break in between. If the readings are different by 5 points or more, have it done a third time. There are times to break these rules. If you sometimes feel lightheaded when getting out of bed in the morning or when you stand after sitting, you should have your blood pressure checked while seated and then while standing to see if it falls from one position to the next. Because blood pressure varies throughout the day, your doctor will rarely diagnose hypertension on the basis of a single reading. Instead, he or she  will want to confirm the measurements on at least two occasions, usually within a few weeks of one another. The exception to this rule is if you have a blood pressure reading of 180/110 mm Hg or higher. A result this high usually calls for prompt treatment. It's also a good idea to have your blood pressure measured in both arms at least once, since the reading in one arm (usually the right) may be higher than that in the left. A 2014 study in The American Journal of  Medicine of nearly 3,400 people found average arm- to-arm differences in systolic blood pressure of about 5 points. The higher number should be used to make treatment decisions. In 2017, new guidelines from the American Heart Association, the [employer] of Cardiology, and nine other health organizations lowered the diagnosis of high blood pressure to 130/80 mm Hg or higher for all adults. The guidelines also redefined the various blood pressure categories to now include normal, elevated, Stage 1 hypertension, Stage 2 hypertension, and hypertensive crisis (see "Blood pressure categories"). Blood pressure categories  Blood pressure category SYSTOLIC (upper number)  DIASTOLIC (lower number)  Normal Less than 120 mm Hg and Less than 80 mm Hg  Elevated 120-129 mm Hg and Less than 80 mm Hg  High blood pressure: Stage 1 hypertension 130-139 mm Hg or 80-89 mm Hg  High blood pressure: Stage 2 hypertension 140 mm Hg or higher or 90 mm Hg or higher  Hypertensive crisis (consult your doctor immediately) Higher than 180 mm Hg and/or Higher than 120 mm Hg  Source: American Heart Association and American Stroke Association. For more on getting your blood pressure under control, buy Controlling Your Blood Pressure, a Special Health Report from [facility].

## 2023-04-13 ENCOUNTER — Other Ambulatory Visit: Payer: Self-pay | Admitting: Family

## 2023-04-16 ENCOUNTER — Other Ambulatory Visit: Payer: Self-pay | Admitting: Cardiology

## 2023-04-16 DIAGNOSIS — I25118 Atherosclerotic heart disease of native coronary artery with other forms of angina pectoris: Secondary | ICD-10-CM

## 2023-04-16 DIAGNOSIS — R931 Abnormal findings on diagnostic imaging of heart and coronary circulation: Secondary | ICD-10-CM

## 2023-04-16 DIAGNOSIS — E7841 Elevated Lipoprotein(a): Secondary | ICD-10-CM

## 2023-05-09 ENCOUNTER — Other Ambulatory Visit: Payer: Self-pay | Admitting: Family

## 2023-05-12 ENCOUNTER — Other Ambulatory Visit: Payer: Self-pay | Admitting: Family

## 2023-06-01 DIAGNOSIS — F410 Panic disorder [episodic paroxysmal anxiety] without agoraphobia: Secondary | ICD-10-CM | POA: Diagnosis not present

## 2023-06-01 DIAGNOSIS — F3189 Other bipolar disorder: Secondary | ICD-10-CM | POA: Diagnosis not present

## 2023-06-09 ENCOUNTER — Other Ambulatory Visit: Payer: Self-pay | Admitting: Cardiology

## 2023-06-18 ENCOUNTER — Ambulatory Visit: Payer: Self-pay | Admitting: Family

## 2023-06-18 NOTE — Telephone Encounter (Signed)
 Copied from CRM [id number]. Topic: Clinical - Medication Refill >> Jun 18, 2023 10:51 AM Viola FALCON wrote: Most Recent Primary Care Visit:  Provider: O'SULLIVAN, MELISSA  Department: [facility]  Visit Type: PHYSICAL  Date: 03/27/2023  Medication: Patient needs med for congestion/lungs filled with mucus  Has the patient contacted their pharmacy? No (Agent: If no, request that the patient contact the pharmacy for the refill. If patient does not wish to contact the pharmacy document the reason why and proceed with request.) (Agent: If yes, when and what did the pharmacy advise?)  Is this the correct pharmacy for this prescription? Yes If no, delete pharmacy and type the correct one.  This is the patient's preferred pharmacy:   [id number] - [place], [place] - [address] [place] [address] Phone: [phone] Fax: [phone]    Has the prescription been filled recently? No  Is the patient out of the medication? Yes  Has the patient been seen for an appointment in the last year OR does the patient have an upcoming appointment? Yes  Can we respond through MyChart? No  Agent: Please be advised that Rx refills may take up to 3 business days. We ask that you follow-up with your pharmacy.   Chief Complaint: Cough, chest congestion Symptoms: chest congestion Frequency: constant x 4 days Pertinent Negatives: Patient denies chest pain Disposition: [] ED /[] Urgent Care (no appt availability in office) / [x] Appointment(In office/virtual)/ []  [facility]/ [] Home Care/ [] Refused Recommended Disposition /[] [facility]/ []  Follow-up with PCP Additional Notes: Patient reports cough and congestion x 4 days unrelieved with OTC medication. Patient reports that the congestion is in his chest and he can feel rattling in chest that worse when trying to lay down. Pt reports fever and chills x 3 days, states that last night he has to  changes clothes because he was sweaty with chills. Appt scheduled for 01/07, care advice provided.      Reason for Disposition  [1] Fever returns after gone for over 24 hours AND [2] symptoms worse or not improved  Answer Assessment - Initial Assessment Questions 1. ONSET: When did the cough begin?      4 days ago  2. SEVERITY: How bad is the cough today?      Feels bad, can feel chest congestion  3. SPUTUM: Describe the color of your sputum (none, dry cough; clear, white, yellow, green)     Nothing coming up, If so only very little, and its white   4. HEMOPTYSIS: Are you coughing up any blood? If so ask: How much? (flecks, streaks, tablespoons, etc.) No     5. DIFFICULTY BREATHING: Are you having difficulty breathing? If Yes, ask: How bad is it? (e.g., mild, moderate, severe)    - MILD: No SOB at rest, mild SOB with walking, speaks normally in sentences, can lie down, no retractions, pulse < 100.    - MODERATE: SOB at rest, SOB with minimal exertion and prefers to sit, cannot lie down flat, speaks in phrases, mild retractions, audible wheezing, pulse 100-120.    - SEVERE: Very SOB at rest, speaks in single words, struggling to breathe, sitting hunched forward, retractions, pulse > 120      No difficulty breathing  6. FEVER: Do you have a fever? If Yes, ask: What is your temperature, how was it measured, and when did it start?     Had a fever last night, but was cold and  sweaty.  7. CARDIAC HISTORY: Do you have any history of heart disease? (e.g., heart attack, congestive heart failure)      No history of heart failure  8. LUNG HISTORY: Do you have any history of lung disease?  (e.g., pulmonary embolus, asthma, emphysema)     No history  9. PE RISK FACTORS: Do you have a history of blood clots? (or: recent major surgery, recent prolonged travel, bedridden)     No history  10. OTHER SYMPTOMS: Do you have any other symptoms? (e.g., runny nose, wheezing,  chest pain)       Unable to lay down  11. PREGNANCY: Is there any chance you are pregnant? When was your last menstrual period?       N/A  12. TRAVEL: Have you traveled out of the country in the last month? (e.g., travel history, exposures)       Unsure of possible exposure, no travel  Protocols used: Cough - Acute Non-Productive-A-AH

## 2023-06-18 NOTE — Telephone Encounter (Signed)
 Appt scheduled

## 2023-06-19 ENCOUNTER — Ambulatory Visit: Payer: BC Managed Care – PPO | Admitting: Family

## 2023-06-19 ENCOUNTER — Other Ambulatory Visit (HOSPITAL_BASED_OUTPATIENT_CLINIC_OR_DEPARTMENT_OTHER): Payer: Self-pay

## 2023-06-19 ENCOUNTER — Ambulatory Visit: Payer: BC Managed Care – PPO | Admitting: Family Medicine

## 2023-06-19 ENCOUNTER — Ambulatory Visit (HOSPITAL_BASED_OUTPATIENT_CLINIC_OR_DEPARTMENT_OTHER)
Admission: RE | Admit: 2023-06-19 | Discharge: 2023-06-19 | Disposition: A | Discharge status: Home / Self Care | Payer: BC Managed Care – PPO | Source: Ambulatory Visit | Attending: Family | Admitting: Family

## 2023-06-19 ENCOUNTER — Encounter: Payer: Self-pay | Admitting: Family

## 2023-06-19 VITALS — BP 139/78 | HR 68 | Temp 98.5°F | Resp 16 | Ht 70.0 in | Wt 203.0 lb

## 2023-06-19 DIAGNOSIS — R059 Cough, unspecified: Secondary | ICD-10-CM | POA: Diagnosis not present

## 2023-06-19 DIAGNOSIS — J209 Acute bronchitis, unspecified: Secondary | ICD-10-CM | POA: Insufficient documentation

## 2023-06-19 DIAGNOSIS — R062 Wheezing: Secondary | ICD-10-CM | POA: Diagnosis not present

## 2023-06-19 MED ORDER — ALBUTEROL SULFATE HFA 108 (90 BASE) MCG/ACT IN AERS
2.0000 | INHALATION_SPRAY | Freq: Four times a day (QID) | RESPIRATORY_TRACT | 0 refills | Status: DC | PRN
  Filled 2023-06-19: qty 6.7, 25d supply, fill #0

## 2023-06-19 MED ORDER — PREDNISONE 10 MG PO TABS
ORAL_TABLET | ORAL | 0 refills | Status: AC
  Filled 2023-06-19: qty 20, 8d supply, fill #0

## 2023-06-19 MED ORDER — BENZONATATE 100 MG PO CAPS
100.0000 mg | ORAL_CAPSULE | Freq: Three times a day (TID) | ORAL | 0 refills | Status: DC | PRN
  Filled 2023-06-19: qty 20, 7d supply, fill #0

## 2023-06-19 MED ORDER — DOXYCYCLINE HYCLATE 100 MG PO TABS
100.0000 mg | ORAL_TABLET | Freq: Two times a day (BID) | ORAL | 0 refills | Status: DC
  Filled 2023-06-19: qty 14, 7d supply, fill #0

## 2023-06-19 NOTE — Progress Notes (Signed)
 Subjective:     Patient ID: Francisco Lopez, male    DOB: 1960-07-27, 63 y.o.   MRN: [id number]  Chief Complaint  Patient presents with   Cough    Patient complains of productive cough that started 06/14/23    Cough    Discussed the use of AI scribe software for clinical note transcription with the patient, who gave verbal consent to proceed.  History of Present Illness   The patient, with a history of Seroquel  use, presents with a persistent cough that started five days ago. The cough is associated with chest discomfort, described as feeling like 'somebody beat me.' The cough is worse at night, preventing sleep, and is productive. The patient also reports intermittent fever and congestion. The symptoms were severe enough to cause the patient to miss work and stay in bed. The patient denies recent COVID-19 testing but does not believe his symptoms are consistent with the virus. The patient also mentions exposure to others with similar symptoms.     There are no preventive care reminders to display for this patient.  Past Medical History:  Diagnosis Date   Anxiety    Basal cell carcinoma 2006   BPH with obstruction/lower urinary tract symptoms 10/19/2014   Carpal tunnel syndrome 02/07/2016   Chronic rhinitis 07/24/2016   Depression    Diastolic dysfunction 02/04/2015   Per 2D echo 8/16   ED (erectile dysfunction) of organic origin 09/16/2014   Elevated coronary artery calcium  score 02/06/2020   Family history of early CAD 09/23/2021   Frozen shoulder 2018   left   Gastritis    GERD (gastroesophageal reflux disease) 12/27/2015   Herpes simplex type 1 antibody positive 01/28/2015   Herpes simplex type 2 infection 01/28/2015   History of chicken pox    History of obstructive sleep apnea 10/29/2015   HTN (hypertension) 08/05/2014   Hyperlipidemia    Left shoulder pain 02/20/2017   Mood disorder (HCC) 08/25/2016   Osteoarthritis of right hand 03/31/2022   Preventative health  care 09/16/2014   Right inguinal pain 10/25/2020    Past Surgical History:  Procedure Laterality Date   COLONOSCOPY     around 2015 [facility] in [place]   KNEE SURGERY Left 2000   meninscus repair   SHOULDER ARTHROSCOPY Left 01/2018   UPPER GASTROINTESTINAL ENDOSCOPY      Family History  Problem Relation Age of Onset   Diabetes Mother        type II   Anxiety disorder Mother    Hypertension Father    Alcohol abuse Father    Liver cancer Sister    Hemachromatosis Sister    COPD Sister        died from covid complication   Diabetes Mellitus II Sister    Hemachromatosis Brother    Liver cancer Brother    CAD Brother        smoker   CAD Brother        smoker   CAD Paternal Grandmother    COPD Paternal Grandfather    Colon cancer Neg Hx    Esophageal cancer Neg Hx    Stomach cancer Neg Hx    Rectal cancer Neg Hx     Social History   Socioeconomic History   Marital status: Divorced    Spouse name: Not on file   Number of children: Not on file   Years of education: Not on file   Highest education level: 8th grade  Occupational History  Not on file  Tobacco Use   Smoking status: Never   Smokeless tobacco: Never  Vaping Use   Vaping status: Never Used  Substance and Sexual Activity   Alcohol use: No   Drug use: No   Sexual activity: Yes  Other Topics Concern   Not on file  Social History Narrative   Separated   1 child (61 yr old daughter)- lives in [place]   Drives 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[employer], 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   Social Drivers of Health   Financial Resource Strain: Low Risk  (09/27/2022)   Overall Financial Resource Strain (CARDIA)    Difficulty of Paying Living Expenses: Not hard at all  Food Insecurity: No Food Insecurity (09/27/2022)   Hunger Vital Sign    Worried About Running Out of Food in the Last Year: Never true    Ran Out of Food in the Last Year: Never true  Transportation Needs: Unknown (09/27/2022)   PRAPARE - Therapist, Art (Medical): Not on file    Lack of Transportation (Non-Medical): Patient declined  Physical Activity: Sufficiently Active (06/19/2023)   Exercise Vital Sign    Days of Exercise per Week: 7 days    Minutes of Exercise per Session: 30 min  Stress: No Stress Concern Present (06/19/2023)   [employer] of Occupational Health - Occupational Stress Questionnaire    Feeling of Stress : Not at all  Social Connections: Not on file  Intimate Partner Violence: Not on file    Outpatient Medications Prior to Visit  Medication Sig Dispense Refill   amLODipine  (NORVASC ) 10 MG tablet TAKE 1 TABLET BY MOUTH EVERY DAY 90 tablet 1   aspirin  EC 81 MG tablet Take 1 tablet (81 mg total) by mouth daily. Swallow whole. 90 tablet 3   atorvastatin  (LIPITOR) 40 MG tablet Take 1 tablet (40 mg total) by mouth every other day. 45 tablet 3   divalproex (DEPAKOTE ER) 500 MG 24 hr tablet Take 1,000 mg by mouth 2 (two) times daily.     Evolocumab  (REPATHA ) 140 MG/ML SOSY Inject 140 mg into the skin every 14 (fourteen) days. 6 mL 2   metoprolol  succinate (TOPROL  XL) 50 MG 24 hr tablet Take 1 tablet (50 mg total) by mouth daily. Take with or immediately following a meal. 90 tablet 1   Multiple Vitamins-Minerals (MENS MULTIVITAMIN PLUS PO) Take 1 tablet by mouth daily in the afternoon.     Omega-3 Fatty Acids (FISH OIL ) 1000 MG CAPS Take 2 capsules (2,000 mg total) by mouth 2 (two) times daily. 180 capsule 3   pantoprazole  (PROTONIX ) 20 MG tablet TAKE 1 TABLET BY MOUTH EVERY DAY 90 tablet 0   QUEtiapine  (SEROQUEL ) 400 MG tablet Take 400 mg by mouth at bedtime.     sertraline (ZOLOFT) 100 MG tablet Take 100 mg by mouth daily.     tadalafil  (CIALIS ) 5 MG tablet TAKE ONE TABLET ONCE DAILY. IF NEEDED MAY TAKE 5 MG ADDITIONAL DOSE PRIOR TO INTERCOURSE 90 tablet 1   tamsulosin (FLOMAX) 0.4 MG CAPS capsule Take 0.4 mg by mouth daily.     valACYclovir  (VALTREX ) 500 MG tablet TAKE 1 TABLET (500 MG TOTAL) BY  MOUTH DAILY. 90 tablet 1   valsartan  (DIOVAN ) 160 MG tablet TAKE 1 TABLET BY MOUTH EVERY DAY 90 tablet 1   No facility-administered medications prior to visit.    No Known Allergies  Review of Systems  Respiratory:  Positive for cough.  Objective:    Physical Exam Constitutional:      General: He is not in acute distress.    Appearance: He is well-developed.  HENT:     Head: Normocephalic and atraumatic.  Cardiovascular:     Rate and Rhythm: Normal rate and regular rhythm.     Heart sounds: No murmur heard. Pulmonary:     Effort: Pulmonary effort is normal. No respiratory distress.     Breath sounds: Wheezing present. No rales.  Skin:    General: Skin is warm and dry.  Neurological:     Mental Status: He is alert and oriented to person, place, and time.  Psychiatric:        Behavior: Behavior normal.        Thought Content: Thought content normal.      BP (!) 153/93 (BP Location: Left Arm, Patient Position: Sitting, Cuff Size: Normal)   Pulse 68   Temp 98.5 F (36.9 C) (Oral)   Resp 16   Ht 5' 10 (1.778 m)   Wt 203 lb (92.1 kg)   SpO2 97%   BMI 29.13 kg/m  Wt Readings from Last 3 Encounters:  06/19/23 203 lb (92.1 kg)  04/04/23 208 lb 12.8 oz (94.7 kg)  03/27/23 207 lb (93.9 kg)       Assessment & Plan:   Problem List Items Addressed This Visit       Unprioritized   Acute bronchitis with bronchospasm - Primary    Cough with congestion and intermittent fever for 5 days. Wheezing and chest tightness on exam. No recent COVID testing. -Start Doxycycline  due to potential interaction with Seroquel  and Z-Pak. -Start Prednisone  for inflammation. -Start inhaler. -Start Tessalon  for cough as needed. -Order chest X-ray to rule out pneumonia. -Administer nebulizer treatment in office today. -If no improvement in 3-4 days, patient to notify office.      Relevant Medications   predniSONE  (DELTASONE ) 10 MG tablet   albuterol  (VENTOLIN  HFA) 108 (90  Base) MCG/ACT inhaler   benzonatate  (TESSALON ) 100 MG capsule   Other Relevant Orders   DG Chest 2 View    I am having Francisco Lopez start on predniSONE , albuterol , benzonatate , and doxycycline . I am also having him maintain his Multiple Vitamins-Minerals (MENS MULTIVITAMIN PLUS PO), aspirin  EC, Fish Oil , QUEtiapine , pantoprazole , metoprolol  succinate, sertraline, tamsulosin, divalproex, atorvastatin , Repatha , tadalafil , amLODipine , valACYclovir , and valsartan .  Meds ordered this encounter  Medications   predniSONE  (DELTASONE ) 10 MG tablet    Sig: Take 4 tablets (40 mg total) by mouth daily for 2 days, THEN 3 tablets (30 mg total) daily for 2 days, THEN 2 tablets (20 mg total) daily for 2 days, THEN 1 tablet (10 mg total) daily for 2 days.    Dispense:  20 tablet    Refill:  0    Supervising Provider:   DOMENICA BLACKBIRD A [[id number]]   albuterol  (VENTOLIN  HFA) 108 (90 Base) MCG/ACT inhaler    Sig: Inhale 2 puffs into the lungs every 6 (six) hours as needed for wheezing or shortness of breath.    Dispense:  6.7 g    Refill:  0    Supervising Provider:   DOMENICA BLACKBIRD A [[id number]]   benzonatate  (TESSALON ) 100 MG capsule    Sig: Take 1 capsule (100 mg total) by mouth 3 (three) times daily as needed.    Dispense:  20 capsule    Refill:  0    Supervising Provider:   DOMENICA BLACKBIRD A [[id number]]   doxycycline  (VIBRA -TABS)  100 MG tablet    Sig: Take 1 tablet (100 mg total) by mouth 2 (two) times daily.    Dispense:  14 tablet    Refill:  0    Supervising Provider:   DOMENICA BLACKBIRD A [[id number]]

## 2023-06-19 NOTE — Assessment & Plan Note (Signed)
  Cough with congestion and intermittent fever for 5 days. Wheezing and chest tightness on exam. No recent COVID testing. -Start Doxycycline  due to potential interaction with Seroquel  and Z-Pak. -Start Prednisone  for inflammation. -Start inhaler. -Start Tessalon  for cough as needed. -Order chest X-ray to rule out pneumonia. -Administer nebulizer treatment in office today. -If no improvement in 3-4 days, patient to notify office.

## 2023-06-19 NOTE — Patient Instructions (Signed)
 VISIT SUMMARY:  You came in today with a persistent cough, chest discomfort, and other symptoms that have been troubling you for the past five days. You also reported intermittent fever and congestion, which have been severe enough to keep you from work and in bed. We discussed your symptoms and possible exposure to others with similar issues.  YOUR PLAN:  -ACUTE BRONCHITIS: Acute bronchitis is an inflammation of the bronchial tubes in the lungs, often causing cough, congestion, and sometimes fever. We are starting you on Doxycycline  to avoid interactions with your current medication, Seroquel . You will also take Prednisone  to reduce inflammation, use an inhaler, and take Tessalon  as needed for your cough. A chest X-ray has been ordered to rule out pneumonia, and you received a nebulizer treatment in the office today. If you do not see improvement in 3-4 days, please notify our office.  INSTRUCTIONS:  Please follow the medication regimen as prescribed: Doxycycline , Prednisone , inhaler, and Tessalon  as needed. Get a chest X-ray as ordered. If your symptoms do not improve in 3-4 days, contact our office immediately.

## 2023-08-03 DIAGNOSIS — F410 Panic disorder [episodic paroxysmal anxiety] without agoraphobia: Secondary | ICD-10-CM | POA: Diagnosis not present

## 2023-08-03 DIAGNOSIS — F3181 Bipolar II disorder: Secondary | ICD-10-CM | POA: Diagnosis not present

## 2023-08-17 ENCOUNTER — Ambulatory Visit: Admitting: Family

## 2023-08-17 VITALS — BP 140/75 | HR 55 | Temp 98.7°F | Resp 16 | Ht 70.0 in | Wt 207.0 lb

## 2023-08-17 DIAGNOSIS — N401 Enlarged prostate with lower urinary tract symptoms: Secondary | ICD-10-CM | POA: Diagnosis not present

## 2023-08-17 DIAGNOSIS — R739 Hyperglycemia, unspecified: Secondary | ICD-10-CM | POA: Diagnosis not present

## 2023-08-17 DIAGNOSIS — K2100 Gastro-esophageal reflux disease with esophagitis, without bleeding: Secondary | ICD-10-CM

## 2023-08-17 DIAGNOSIS — I10 Essential (primary) hypertension: Secondary | ICD-10-CM

## 2023-08-17 DIAGNOSIS — E785 Hyperlipidemia, unspecified: Secondary | ICD-10-CM | POA: Diagnosis not present

## 2023-08-17 DIAGNOSIS — N529 Male erectile dysfunction, unspecified: Secondary | ICD-10-CM

## 2023-08-17 DIAGNOSIS — N138 Other obstructive and reflux uropathy: Secondary | ICD-10-CM

## 2023-08-17 DIAGNOSIS — K529 Noninfective gastroenteritis and colitis, unspecified: Secondary | ICD-10-CM | POA: Insufficient documentation

## 2023-08-17 DIAGNOSIS — B009 Herpesviral infection, unspecified: Secondary | ICD-10-CM

## 2023-08-17 DIAGNOSIS — R197 Diarrhea, unspecified: Secondary | ICD-10-CM

## 2023-08-17 DIAGNOSIS — F39 Unspecified mood [affective] disorder: Secondary | ICD-10-CM | POA: Diagnosis not present

## 2023-08-17 LAB — BASIC METABOLIC PANEL
BUN: 18 mg/dL (ref 6–23)
CO2: 28 meq/L (ref 19–32)
Calcium: 9.8 mg/dL (ref 8.4–10.5)
Chloride: 103 meq/L (ref 96–112)
Creatinine, Ser: 0.85 mg/dL (ref 0.40–1.50)
GFR: 93.02 mL/min (ref >60.00)
Glucose, Bld: 106 mg/dL — ABNORMAL HIGH (ref 70–99)
Potassium: 4.4 meq/L (ref 3.5–5.1)
Sodium: 140 meq/L (ref 135–145)

## 2023-08-17 LAB — HEMOGLOBIN A1C: Hgb A1c MFr Bld: 6.5 % (ref 4.6–6.5)

## 2023-08-17 MED ORDER — VALSARTAN 320 MG PO TABS: 320.0000 mg | ORAL_TABLET | Freq: Every day | ORAL | 1 refills | Status: DC

## 2023-08-17 NOTE — Assessment & Plan Note (Signed)
 New.  Check stool studies, rec imodium prn and trial of dairy free. If studies negative and symptoms persist, plan referral to GI.

## 2023-08-17 NOTE — Assessment & Plan Note (Addendum)
 Fair response to Cialis- refer to urology.

## 2023-08-17 NOTE — Progress Notes (Signed)
 Subjective:     Patient ID: Francisco Lopez, male    DOB: 18-Jul-1960, 63 y.o.   MRN: [id number]  Chief Complaint  Patient presents with   Hypertension    Here for follow up   Hyperlipidemia    Here for follow up       Discussed the use of AI scribe software for clinical note transcription with the patient, who gave verbal consent to proceed.  History of Present Illness The patient presents with chronic loose stools and urinary issues.  He has been experiencing chronic loose stools for approximately six months, occurring about three times a week. The episodes are severe, with an urgent need to defecate, sometimes triggered by certain foods. Despite dietary changes, including reducing coffee and blueberries, the symptoms persist. He suspects a possible connection to dairy, as he uses cream in his coffee and skim milk with cereal, but is unsure if this is related. The loose stools are painful and impact his daily activities, especially while working as a [employer]. No weight loss has been noted despite initial concerns.  He experiences urinary urgency and nocturia, waking up around 1 AM to urinate, with only a small amount of urine passed. He is on Cialis 5 mg daily, Flomax, and finasteride for urinary symptoms, but reports that the effectiveness of Cialis has decreased over time. He has tried increasing the Cialis dose with minimal improvement.  He is currently taking amlodipine 10 mg, metoprolol XL 50 mg, and Diovan 160 mg for blood pressure management. He also takes Repatha for cholesterol, which has recently become more affordable for him.  He reports mood stability on Seroquel and Depakote, prescribed by his psychiatrist, Dr. Lafayette Dragon.  He has a family history of liver cancer, as his brother died from it. His mother had polio and diabetes, and he describes her as having been in significant pain. He also mentions a brother currently undergoing treatment for colon cancer.  He has had  multiple colds this year, which is unusual for him.  BP Readings from Last 3 Encounters:  08/17/23 (!) 140/75  06/19/23 139/78  04/04/23 (!) 140/70    Lab Results  Component Value Date   HGBA1C 5.6 06/26/2017       Health Maintenance Due  Topic Date Due   Pneumococcal Vaccine 54-9 Years old (1 of 2 - PCV) Never done    Past Medical History:  Diagnosis Date   Anxiety    Basal cell carcinoma 2006   BPH with obstruction/lower urinary tract symptoms 10/19/2014   Carpal tunnel syndrome 02/07/2016   Chronic rhinitis 07/24/2016   Depression    Diastolic dysfunction 02/04/2015   Per 2D echo 8/16   ED (erectile dysfunction) of organic origin 09/16/2014   Elevated coronary artery calcium score 02/06/2020   Family history of early CAD 09/23/2021   Frozen shoulder 2018   left   Gastritis    GERD (gastroesophageal reflux disease) 12/27/2015   Herpes simplex type 1 antibody positive 01/28/2015   Herpes simplex type 2 infection 01/28/2015   History of chicken pox    History of obstructive sleep apnea 10/29/2015   HTN (hypertension) 08/05/2014   Hyperlipidemia    Left shoulder pain 02/20/2017   Mood disorder (HCC) 08/25/2016   Osteoarthritis of right hand 03/31/2022   Preventative health care 09/16/2014   Right inguinal pain 10/25/2020    Past Surgical History:  Procedure Laterality Date   COLONOSCOPY     around 2015 [facility] in [place]  KNEE SURGERY Left 2000   meninscus repair   SHOULDER ARTHROSCOPY Left 01/2018   UPPER GASTROINTESTINAL ENDOSCOPY      Family History  Problem Relation Age of Onset   Diabetes Mother        type II   Anxiety disorder Mother    Hypertension Father    Alcohol abuse Father    Liver cancer Sister    Hemachromatosis Sister    COPD Sister        died from covid complication   Diabetes Mellitus II Sister    Hemachromatosis Brother    Liver cancer Brother    CAD Brother        smoker   CAD Brother        smoker   CAD  Paternal Grandmother    COPD Paternal Grandfather    Colon cancer Neg Hx    Esophageal cancer Neg Hx    Stomach cancer Neg Hx    Rectal cancer Neg Hx     Social History   Socioeconomic History   Marital status: Divorced    Spouse name: Not on file   Number of children: Not on file   Years of education: Not on file   Highest education level: 8th grade  Occupational History   Not on file  Tobacco Use   Smoking status: Never   Smokeless tobacco: Never  Vaping Use   Vaping status: Never Used  Substance and Sexual Activity   Alcohol use: No   Drug use: No   Sexual activity: Yes  Other Topics Concern   Not on file  Social History Narrative   Separated   1 child (66 yr old daughter)- lives in [place]   Drives 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[employer], 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   Social Drivers of Health   Financial Resource Strain: Low Risk  (09/27/2022)   Overall Financial Resource Strain (CARDIA)    Difficulty of Paying Living Expenses: Not hard at all  Food Insecurity: No Food Insecurity (09/27/2022)   Hunger Vital Sign    Worried About Running Out of Food in the Last Year: Never true    Ran Out of Food in the Last Year: Never true  Transportation Needs: Unknown (09/27/2022)   PRAPARE - Administrator, Civil Service (Medical): Not on file    Lack of Transportation (Non-Medical): Patient declined  Physical Activity: Sufficiently Active (06/19/2023)   Exercise Vital Sign    Days of Exercise per Week: 7 days    Minutes of Exercise per Session: 30 min  Stress: No Stress Concern Present (06/19/2023)   [employer] of Occupational Health - Occupational Stress Questionnaire    Feeling of Stress : Not at all  Social Connections: Not on file  Intimate Partner Violence: Not on file    Outpatient Medications Prior to Visit  Medication Sig Dispense Refill   albuterol (VENTOLIN HFA) 108 (90 Base) MCG/ACT inhaler Inhale 2 puffs into the lungs every 6 (six) hours as needed for  wheezing or shortness of breath. 6.7 g 0   amLODipine (NORVASC) 10 MG tablet TAKE 1 TABLET BY MOUTH EVERY DAY 90 tablet 1   aspirin EC 81 MG tablet Take 1 tablet (81 mg total) by mouth daily. Swallow whole. 90 tablet 3   atorvastatin (LIPITOR) 40 MG tablet Take 1 tablet (40 mg total) by mouth every other day. 45 tablet 3   divalproex (DEPAKOTE ER) 500 MG 24 hr tablet Take 1,000 mg by  mouth 2 (two) times daily.     Evolocumab (REPATHA) 140 MG/ML SOSY Inject 140 mg into the skin every 14 (fourteen) days. 6 mL 2   metoprolol succinate (TOPROL XL) 50 MG 24 hr tablet Take 1 tablet (50 mg total) by mouth daily. Take with or immediately following a meal. 90 tablet 1   Multiple Vitamins-Minerals (MENS MULTIVITAMIN PLUS PO) Take 1 tablet by mouth daily in the afternoon.     Omega-3 Fatty Acids (FISH OIL) 1000 MG CAPS Take 2 capsules (2,000 mg total) by mouth 2 (two) times daily. 180 capsule 3   pantoprazole (PROTONIX) 20 MG tablet TAKE 1 TABLET BY MOUTH EVERY DAY 90 tablet 0   QUEtiapine (SEROQUEL) 400 MG tablet Take 400 mg by mouth at bedtime.     sertraline (ZOLOFT) 100 MG tablet Take 100 mg by mouth daily.     tadalafil (CIALIS) 5 MG tablet TAKE ONE TABLET ONCE DAILY. IF NEEDED MAY TAKE 5 MG ADDITIONAL DOSE PRIOR TO INTERCOURSE 90 tablet 1   tamsulosin (FLOMAX) 0.4 MG CAPS capsule Take 0.4 mg by mouth daily.     valACYclovir (VALTREX) 500 MG tablet TAKE 1 TABLET (500 MG TOTAL) BY MOUTH DAILY. 90 tablet 1   benzonatate (TESSALON) 100 MG capsule Take 1 capsule (100 mg total) by mouth 3 (three) times daily as needed. 20 capsule 0   doxycycline (VIBRA-TABS) 100 MG tablet Take 1 tablet (100 mg total) by mouth 2 (two) times daily. 14 tablet 0   valsartan (DIOVAN) 160 MG tablet TAKE 1 TABLET BY MOUTH EVERY DAY 90 tablet 1   No facility-administered medications prior to visit.    No Known Allergies  ROS See HPI    Objective:    Physical Exam Constitutional:      General: He is not in acute  distress.    Appearance: He is well-developed.  HENT:     Head: Normocephalic and atraumatic.  Cardiovascular:     Rate and Rhythm: Normal rate and regular rhythm.     Heart sounds: No murmur heard. Pulmonary:     Effort: Pulmonary effort is normal. No respiratory distress.     Breath sounds: Normal breath sounds. No wheezing or rales.  Skin:    General: Skin is warm and dry.  Neurological:     Mental Status: He is alert and oriented to person, place, and time.  Psychiatric:        Behavior: Behavior normal.        Thought Content: Thought content normal.      BP (!) 140/75   Pulse (!) 55   Temp 98.7 F (37.1 C) (Oral)   Resp 16   Ht 5\' 10"  (1.778 m)   Wt 207 lb (93.9 kg)   SpO2 97%   BMI 29.70 kg/m  Wt Readings from Last 3 Encounters:  08/17/23 207 lb (93.9 kg)  06/19/23 203 lb (92.1 kg)  04/04/23 208 lb 12.8 oz (94.7 kg)       Assessment & Plan:   Problem List Items Addressed This Visit       High   BPH with obstruction/lower urinary tract symptoms   Has a good bit of nocturia despite flomax and cialis. Refer to Urology.      Relevant Orders   Ambulatory referral to Urology     Unprioritized   Mood disorder (HCC)   Maintained on seroquel and depakote. Stable, followed by psychiatry, Dr. Milagros Evener.       Hyperlipidemia  Lab Results  Component Value Date   CHOL 109 03/27/2023   HDL 41.20 03/27/2023   LDLCALC 18 03/27/2023   LDLDIRECT 97.0 12/27/2015   TRIG 248.0 (H) 03/27/2023   CHOLHDL 3 03/27/2023   Improved on Repatha which is being monitored by cardiology.       Relevant Medications   valsartan (DIOVAN) 320 MG tablet   HTN (hypertension)   BP Readings from Last 3 Encounters:  08/17/23 (!) 159/75  06/19/23 139/78  04/04/23 (!) 140/70         Relevant Medications   valsartan (DIOVAN) 320 MG tablet   Herpes simplex type 2 infection   Continues valtrex daily without breakouts.       GERD (gastroesophageal reflux disease)    Stable.  Continues pantoprazole.       ED (erectile dysfunction) of organic origin   Fair response to Cialis- refer to urology.       Relevant Orders   Ambulatory referral to Urology   Diarrhea - Primary   New.  Check stool studies, rec imodium prn and trial of dairy free. If studies negative and symptoms persist, plan referral to GI.       Relevant Orders   GI Profile, Stool, PCR   Other Visit Diagnoses       Hyperglycemia       Relevant Orders   HgB A1c   Basic Metabolic Panel (BMET)       I have discontinued Esau Hallstrom's valsartan, benzonatate, and doxycycline. I am also having him start on valsartan. Additionally, I am having him maintain his Multiple Vitamins-Minerals (MENS MULTIVITAMIN PLUS PO), aspirin EC, Fish Oil, QUEtiapine, pantoprazole, metoprolol succinate, sertraline, tamsulosin, divalproex, atorvastatin, Repatha, tadalafil, amLODipine, valACYclovir, and albuterol.  Meds ordered this encounter  Medications   valsartan (DIOVAN) 320 MG tablet    Sig: Take 1 tablet (320 mg total) by mouth daily.    Dispense:  90 tablet    Refill:  1    Supervising Provider:   Danise Edge A [[id number]]

## 2023-08-17 NOTE — Assessment & Plan Note (Signed)
 BP Readings from Last 3 Encounters:  08/17/23 (!) 159/75  06/19/23 139/78  04/04/23 (!) 140/70

## 2023-08-17 NOTE — Assessment & Plan Note (Signed)
 Stable.  Continues pantoprazole.

## 2023-08-17 NOTE — Assessment & Plan Note (Signed)
 Lab Results  Component Value Date   CHOL 109 03/27/2023   HDL 41.20 03/27/2023   LDLCALC 18 03/27/2023   LDLDIRECT 97.0 12/27/2015   TRIG 248.0 (H) 03/27/2023   CHOLHDL 3 03/27/2023   Improved on Repatha which is being monitored by cardiology.

## 2023-08-17 NOTE — Assessment & Plan Note (Addendum)
 Has a good bit of nocturia despite flomax and cialis. Refer to Urology.

## 2023-08-17 NOTE — Assessment & Plan Note (Signed)
 Continues valtrex daily without breakouts.

## 2023-08-17 NOTE — Patient Instructions (Addendum)
 VISIT SUMMARY:  During today's visit, we discussed your ongoing issues with chronic loose stools and urinary symptoms. We also reviewed your blood pressure, cholesterol management, and mood stability. Additionally, we talked about your family history and general health maintenance.  YOUR PLAN:  -CHRONIC DIARRHEA: Chronic diarrhea can be caused by various factors, including lactose intolerance, infections, or irritable bowel syndrome (IBS). We will conduct stool tests to rule out infections and recommend trying non-dairy creamer and almond milk. You can use Imodium for severe episodes. If symptoms persist after testing, we will refer you to a gastroenterologist.  -HYPERTENSION: Hypertension, or high blood pressure, requires careful management to prevent complications. Your blood pressure readings have been slightly elevated, so we will increase your valsartan dose to 320 mg. Please recheck your blood pressure in 1-2 weeks with a nurse, and we will perform follow-up blood work on the same day.  -BENIGN PROSTATIC HYPERPLASIA (BPH): BPH is an enlargement of the prostate gland that can cause urinary symptoms. Since your symptoms have worsened, we will refer you to a urologist for further evaluation and management.  -HYPERLIPIDEMIA: Hyperlipidemia is high cholesterol levels in the blood. You are currently managing this with Repatha, and it's good to hear that the medication has become more affordable for you.  -MOOD DISORDER: Your mood disorder is being well-managed with Seroquel and Depakote, as prescribed by your psychiatrist, Dr. Evelene Croon  -GENERAL HEALTH MAINTENANCE: We will assess your diabetes status and kidney function due to slightly elevated blood glucose levels. Routine labs and normal studies will be reviewed.  INSTRUCTIONS:  Please follow up in 1-2 weeks for a blood pressure recheck and follow-up blood work. Additionally, we will follow up in 3-4 months to review your blood pressure management  and any specialist referrals.

## 2023-08-17 NOTE — Assessment & Plan Note (Signed)
 Maintained on seroquel and depakote. Stable, followed by psychiatry, Dr. Milagros Evener.

## 2023-08-18 ENCOUNTER — Telehealth: Payer: Self-pay | Admitting: Family

## 2023-08-18 DIAGNOSIS — E119 Type 2 diabetes mellitus without complications: Secondary | ICD-10-CM | POA: Insufficient documentation

## 2023-08-18 NOTE — Telephone Encounter (Signed)
 Please advise pt that his sugar is showing that he has diabetes.  No need for medication at this time- treatment is healthy reduced carb diet, exercise and weight loss. Please keep appointment on 4/15 with me.

## 2023-08-20 ENCOUNTER — Other Ambulatory Visit

## 2023-08-20 DIAGNOSIS — R197 Diarrhea, unspecified: Secondary | ICD-10-CM | POA: Diagnosis not present

## 2023-08-20 DIAGNOSIS — K529 Noninfective gastroenteritis and colitis, unspecified: Secondary | ICD-10-CM | POA: Diagnosis not present

## 2023-08-20 NOTE — Telephone Encounter (Signed)
Patient notified of results and provider's comments.

## 2023-08-22 LAB — GI PROFILE, STOOL, PCR

## 2023-08-23 ENCOUNTER — Telehealth: Payer: Self-pay

## 2023-08-23 NOTE — Telephone Encounter (Signed)
 Copied from CRM [id number]. Topic: Referral - Question >> Aug 23, 2023  3:47 PM Elizebeth Brooking wrote: Reason for CRM: Patient called in stating that instead of the urologist in [place], he prefer for it to be sent to the urologist in [place], is requesting for the referral to be sent to that one.

## 2023-08-26 ENCOUNTER — Encounter: Payer: Self-pay | Admitting: Family

## 2023-08-29 ENCOUNTER — Ambulatory Visit: Admitting: Family

## 2023-08-29 DIAGNOSIS — E119 Type 2 diabetes mellitus without complications: Secondary | ICD-10-CM | POA: Diagnosis not present

## 2023-08-29 DIAGNOSIS — L02419 Cutaneous abscess of limb, unspecified: Secondary | ICD-10-CM | POA: Insufficient documentation

## 2023-08-29 DIAGNOSIS — K529 Noninfective gastroenteritis and colitis, unspecified: Secondary | ICD-10-CM

## 2023-08-29 MED ORDER — CEPHALEXIN 500 MG PO CAPS: 500.0000 mg | ORAL_CAPSULE | Freq: Three times a day (TID) | ORAL | 0 refills | Status: DC

## 2023-08-29 NOTE — Progress Notes (Signed)
 Subjective:     Patient ID: Francisco Lopez, male    DOB: 07/25/1960, 63 y.o.   MRN: [id number]  Chief Complaint  Patient presents with   Hypertension    Here for follow up, BP monitoring   Abscess    Complains of possible abscess, left axilla    Hypertension  Abscess    Discussed the use of AI scribe software for clinical note transcription with the patient, who gave verbal consent to proceed.  History of Present Illness  Francisco Lopez is a 63 year old male with newly diagnosed diabetes who presents for new diabetes visit.  He also reports persistent diarrhea and a new axillary "lump".   He experiences persistent diarrhea, with one to two loose stools per day, sometimes every other day. The diarrhea was particularly severe before his visit. He has not used Imodium for symptom relief. GI stool profile was negative.   He describes a new lump under his left arm that appeared a couple of nights ago. Initially, it was swollen and red, but it has since decreased in size and is now a "hard knot." He is concerned about the lump, noting it is the first time he has experienced something like this under his arm.  He has recently changed his eating habits since learning about his diabetes diagnosis.  He has started drinking diabetes-specific drinks, and lost weight, dropping from 206 to 199 pounds on his home scale. He is motivated to avoid the complications his mother experienced from her diabetes, including open heart surgery and insulin dependence. Both of his sisters and an aunt on his mother's side also have diabetes.      Health Maintenance Due  Topic Date Due   Pneumococcal Vaccine 38-25 Years old (1 of 2 - PCV) Never done   OPHTHALMOLOGY EXAM  Never done   Diabetic kidney evaluation - Urine ACR  Never done    Past Medical History:  Diagnosis Date   Anxiety    Basal cell carcinoma 2006   BPH with obstruction/lower urinary tract symptoms 10/19/2014   Carpal tunnel syndrome  02/07/2016   Chronic rhinitis 07/24/2016   Depression    Diastolic dysfunction 02/04/2015   Per 2D echo 8/16   ED (erectile dysfunction) of organic origin 09/16/2014   Elevated coronary artery calcium score 02/06/2020   Family history of early CAD 09/23/2021   Frozen shoulder 2018   left   Gastritis    GERD (gastroesophageal reflux disease) 12/27/2015   Herpes simplex type 1 antibody positive 01/28/2015   Herpes simplex type 2 infection 01/28/2015   History of chicken pox    History of obstructive sleep apnea 10/29/2015   HTN (hypertension) 08/05/2014   Hyperlipidemia    Left shoulder pain 02/20/2017   Mood disorder (HCC) 08/25/2016   Osteoarthritis of right hand 03/31/2022   Preventative health care 09/16/2014   Right inguinal pain 10/25/2020    Past Surgical History:  Procedure Laterality Date   COLONOSCOPY     around 2015 [facility] [place]   KNEE SURGERY Left 2000   meninscus repair   SHOULDER ARTHROSCOPY Left 01/2018   UPPER GASTROINTESTINAL ENDOSCOPY      Family History  Problem Relation Age of Onset   Diabetes Mother        type II   Anxiety disorder Mother    Hypertension Father    Alcohol abuse Father    Liver cancer Sister    Hemachromatosis Sister    COPD Sister  died from covid complication   Diabetes Mellitus II Sister    Hemachromatosis Brother    Liver cancer Brother    CAD Brother        smoker   CAD Brother        smoker   CAD Paternal Grandmother    COPD Paternal Grandfather    Colon cancer Neg Hx    Esophageal cancer Neg Hx    Stomach cancer Neg Hx    Rectal cancer Neg Hx     Social History   Socioeconomic History   Marital status: Divorced    Spouse name: Not on file   Number of children: Not on file   Years of education: Not on file   Highest education level: 8th grade  Occupational History   Not on file  Tobacco Use   Smoking status: Never   Smokeless tobacco: Never  Vaping Use   Vaping status: Never Used   Substance and Sexual Activity   Alcohol use: No   Drug use: No   Sexual activity: Yes  Other Topics Concern   Not on file  Social History Narrative   Separated   1 child (80 yr old daughter)- lives in [place]   Drives 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[employer], 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   Social Drivers of Health   Financial Resource Strain: Low Risk  (09/27/2022)   Overall Financial Resource Strain (CARDIA)    Difficulty of Paying Living Expenses: Not hard at all  Food Insecurity: No Food Insecurity (09/27/2022)   Hunger Vital Sign    Worried About Running Out of Food in the Last Year: Never true    Ran Out of Food in the Last Year: Never true  Transportation Needs: Unknown (09/27/2022)   PRAPARE - Administrator, Civil Service (Medical): Not on file    Lack of Transportation (Non-Medical): Patient declined  Physical Activity: Sufficiently Active (06/19/2023)   Exercise Vital Sign    Days of Exercise per Week: 7 days    Minutes of Exercise per Session: 30 min  Stress: No Stress Concern Present (06/19/2023)   [employer] of Occupational Health - Occupational Stress Questionnaire    Feeling of Stress : Not at all  Social Connections: Not on file  Intimate Partner Violence: Not on file    Outpatient Medications Prior to Visit  Medication Sig Dispense Refill   albuterol (VENTOLIN HFA) 108 (90 Base) MCG/ACT inhaler Inhale 2 puffs into the lungs every 6 (six) hours as needed for wheezing or shortness of breath. 6.7 g 0   amLODipine (NORVASC) 10 MG tablet TAKE 1 TABLET BY MOUTH EVERY DAY 90 tablet 1   aspirin EC 81 MG tablet Take 1 tablet (81 mg total) by mouth daily. Swallow whole. 90 tablet 3   atorvastatin (LIPITOR) 40 MG tablet Take 1 tablet (40 mg total) by mouth every other day. 45 tablet 3   divalproex (DEPAKOTE ER) 500 MG 24 hr tablet Take 1,000 mg by mouth 2 (two) times daily.     Evolocumab (REPATHA) 140 MG/ML SOSY Inject 140 mg into the skin every 14 (fourteen) days. 6 mL 2    metoprolol succinate (TOPROL XL) 50 MG 24 hr tablet Take 1 tablet (50 mg total) by mouth daily. Take with or immediately following a meal. 90 tablet 1   Multiple Vitamins-Minerals (MENS MULTIVITAMIN PLUS PO) Take 1 tablet by mouth daily in the afternoon.     Omega-3 Fatty Acids (FISH OIL) 1000  MG CAPS Take 2 capsules (2,000 mg total) by mouth 2 (two) times daily. 180 capsule 3   pantoprazole (PROTONIX) 20 MG tablet TAKE 1 TABLET BY MOUTH EVERY DAY 90 tablet 0   QUEtiapine (SEROQUEL) 400 MG tablet Take 400 mg by mouth at bedtime.     sertraline (ZOLOFT) 100 MG tablet Take 100 mg by mouth daily.     tadalafil (CIALIS) 5 MG tablet TAKE ONE TABLET ONCE DAILY. IF NEEDED MAY TAKE 5 MG ADDITIONAL DOSE PRIOR TO INTERCOURSE 90 tablet 1   tamsulosin (FLOMAX) 0.4 MG CAPS capsule Take 0.4 mg by mouth daily.     valACYclovir (VALTREX) 500 MG tablet TAKE 1 TABLET (500 MG TOTAL) BY MOUTH DAILY. 90 tablet 1   valsartan (DIOVAN) 320 MG tablet Take 1 tablet (320 mg total) by mouth daily. 90 tablet 1   No facility-administered medications prior to visit.    No Known Allergies  ROS See HPI    Objective:    Physical Exam Constitutional:      General: He is not in acute distress.    Appearance: He is well-developed.  HENT:     Head: Normocephalic and atraumatic.  Cardiovascular:     Rate and Rhythm: Normal rate and regular rhythm.     Heart sounds: No murmur heard. Pulmonary:     Effort: Pulmonary effort is normal. No respiratory distress.     Breath sounds: Normal breath sounds. No wheezing or rales.  Skin:    General: Skin is warm and dry.     Comments: Marble sized firm abscess noted left axilla, tender with surrounding erythema, very minimal central fluctuance  Neurological:     Mental Status: He is alert and oriented to person, place, and time.  Psychiatric:        Behavior: Behavior normal.        Thought Content: Thought content normal.    Diabetic Foot Exam - Simple   Simple Foot  Form Diabetic Foot exam was performed with the following findings: Yes 08/29/2023  3:28 PM  Visual Inspection No deformities, no ulcerations, no other skin breakdown bilaterally: Yes Sensation Testing Intact to touch and monofilament testing bilaterally: Yes Pulse Check Posterior Tibialis and Dorsalis pulse intact bilaterally: Yes Comments       There were no vitals taken for this visit. Wt Readings from Last 3 Encounters:  08/17/23 207 lb (93.9 kg)  06/19/23 203 lb (92.1 kg)  04/04/23 208 lb 12.8 oz (94.7 kg)       Assessment & Plan:   Problem List Items Addressed This Visit       Unprioritized   Controlled type 2 diabetes mellitus without complication, without long-term current use of insulin (HCC)    HbA1c of 6.5% with family history. Lifestyle changes have reduced weight. No medication needed currently. - Collect urine sample to check for microalbuminuria. - Recommend annual dilated eye exam for diabetic retinopathy monitoring. - Avoid walking barefoot - Perform annual foot exam to assess for neuropathy and vascular issues. - Recheck HbA1c in three months. - Advise on diabetic diet, emphasizing portion control and substitution of whole grains for refined carbohydrates.      Relevant Orders   Urine Microalbumin w/creat. ratio   Chronic diarrhea - Primary   Persistent diarrhea with negative stool study. Gastroenterology referral in progress. - Recommend over-the-counter Imodium for symptomatic relief.       Relevant Orders   Ambulatory referral to Gastroenterology   Axillary abscess   - Advise  warm compresses twice daily. - begin Keflex - Monitor for increase in size, which may necessitate drainage.      Relevant Medications   cephALEXin (KEFLEX) 500 MG capsule    I am having Ledell Pesch start on cephALEXin. I am also having him maintain his Multiple Vitamins-Minerals (MENS MULTIVITAMIN PLUS PO), aspirin EC, Fish Oil, QUEtiapine, pantoprazole, metoprolol  succinate, sertraline, tamsulosin, divalproex, atorvastatin, Repatha, tadalafil, amLODipine, valACYclovir, albuterol, and valsartan.  Meds ordered this encounter  Medications   cephALEXin (KEFLEX) 500 MG capsule    Sig: Take 1 capsule (500 mg total) by mouth 3 (three) times daily.    Dispense:  21 capsule    Refill:  0    Supervising Provider:   Danise Edge A [[id number]]

## 2023-08-29 NOTE — Assessment & Plan Note (Signed)
-   Advise warm compresses twice daily. - begin Keflex - Monitor for increase in size, which may necessitate drainage.

## 2023-08-29 NOTE — Telephone Encounter (Signed)
 Message read by Pryor Ochoa at 5:04PM on 08/28/2023.

## 2023-08-29 NOTE — Patient Instructions (Signed)
 VISIT SUMMARY:  Today, we discussed your persistent diarrhea and a new lump under your arm. We also reviewed your pre-diabetes management and recent lifestyle changes.  YOUR PLAN:  -CHRONIC DIARRHEA: Chronic diarrhea means having loose stools for an extended period. We recommend using over-the-counter Imodium to help manage your symptoms. You will also need to follow up with gastroenterology to schedule a colonoscopy for further evaluation.  -AXILLARY ABSCESS: An axillary abscess is a collection of pus under the skin in the armpit area. It has reduced in size and does not need immediate drainage. We have prescribed antibiotics and recommend applying warm compresses twice daily. Please monitor the lump, and if it increases in size, it may need to be drained.  -DIABETES: Your HbA1c is 6.5%, and you have a family history of diabetes. We will collect a urine sample to check for kidney issues and recommend an annual eye exam to monitor for diabetic retinopathy. We will also perform an annual foot exam to check for nerve and blood vessel problems. Your HbA1c will be rechecked in three months. Continue with your diabetic diet, focusing on portion control and choosing whole grains over refined carbohydrates.

## 2023-08-29 NOTE — Assessment & Plan Note (Signed)
  HbA1c of 6.5% with family history. Lifestyle changes have reduced weight. No medication needed currently. - Collect urine sample to check for microalbuminuria. - Recommend annual dilated eye exam for diabetic retinopathy monitoring. - Avoid walking barefoot - Perform annual foot exam to assess for neuropathy and vascular issues. - Recheck HbA1c in three months. - Advise on diabetic diet, emphasizing portion control and substitution of whole grains for refined carbohydrates.

## 2023-08-29 NOTE — Assessment & Plan Note (Signed)
 Persistent diarrhea with negative stool study. Gastroenterology referral in progress. - Recommend over-the-counter Imodium for symptomatic relief.

## 2023-08-30 LAB — MICROALBUMIN / CREATININE URINE RATIO
Creatinine,U: 165.5 mg/dL
Microalb Creat Ratio: 12.1 mg/g (ref 0.0–30.0)
Microalb, Ur: 2 mg/dL — ABNORMAL HIGH (ref 0.0–1.9)

## 2023-08-31 ENCOUNTER — Telehealth: Payer: Self-pay | Admitting: Family

## 2023-08-31 DIAGNOSIS — E119 Type 2 diabetes mellitus without complications: Secondary | ICD-10-CM

## 2023-08-31 NOTE — Telephone Encounter (Signed)
 Called patient but no answer, left voice mail for patient to call back.  Please transfer call back to office.

## 2023-08-31 NOTE — Telephone Encounter (Signed)
 Please advise pt that his urine is showing some protein.  Given his recent diabetes diagnosis, I would recommend that he add lisinopril 2.5 mg once daily for kidney prevention.  Repeat bmet in 1 week.

## 2023-09-03 MED ORDER — LISINOPRIL 2.5 MG PO TABS
2.5000 mg | ORAL_TABLET | Freq: Every day | ORAL | 3 refills | Status: DC
Start: 2023-09-03 — End: 2023-10-02

## 2023-09-03 NOTE — Addendum Note (Signed)
 Addended by: Wilford Corner on: 09/03/2023 02:07 PM   Modules accepted: Orders

## 2023-09-03 NOTE — Telephone Encounter (Signed)
 Patient notified of results and new medication. He verbalizing understanding

## 2023-09-03 NOTE — Telephone Encounter (Signed)
 Called patient but no answer, left voice mail for patient to call back. Please transfer

## 2023-09-04 NOTE — Telephone Encounter (Signed)
 Opened in error

## 2023-09-10 ENCOUNTER — Other Ambulatory Visit (INDEPENDENT_AMBULATORY_CARE_PROVIDER_SITE_OTHER)

## 2023-09-10 DIAGNOSIS — E119 Type 2 diabetes mellitus without complications: Secondary | ICD-10-CM

## 2023-09-11 ENCOUNTER — Encounter: Payer: Self-pay | Admitting: Family

## 2023-09-11 LAB — BASIC METABOLIC PANEL WITH GFR
BUN: 17 mg/dL (ref 6–23)
CO2: 25 meq/L (ref 19–32)
Calcium: 9.3 mg/dL (ref 8.4–10.5)
Chloride: 104 meq/L (ref 96–112)
Creatinine, Ser: 0.85 mg/dL (ref 0.40–1.50)
GFR: 92.97 mL/min (ref >60.00)
Glucose, Bld: 97 mg/dL (ref 70–99)
Potassium: 4.2 meq/L (ref 3.5–5.1)
Sodium: 138 meq/L (ref 135–145)

## 2023-09-14 DIAGNOSIS — F4312 Post-traumatic stress disorder, chronic: Secondary | ICD-10-CM | POA: Diagnosis not present

## 2023-09-14 DIAGNOSIS — F4322 Adjustment disorder with anxiety: Secondary | ICD-10-CM | POA: Diagnosis not present

## 2023-09-14 DIAGNOSIS — F4323 Adjustment disorder with mixed anxiety and depressed mood: Secondary | ICD-10-CM | POA: Diagnosis not present

## 2023-09-20 ENCOUNTER — Ambulatory Visit: Admitting: Urology

## 2023-09-20 ENCOUNTER — Encounter: Payer: Self-pay | Admitting: Urology

## 2023-09-20 VITALS — BP 133/69 | HR 58 | Ht 71.0 in | Wt 191.0 lb

## 2023-09-20 DIAGNOSIS — N138 Other obstructive and reflux uropathy: Secondary | ICD-10-CM

## 2023-09-20 DIAGNOSIS — N401 Enlarged prostate with lower urinary tract symptoms: Secondary | ICD-10-CM | POA: Diagnosis not present

## 2023-09-20 LAB — URINALYSIS, ROUTINE W REFLEX MICROSCOPIC
Bilirubin, UA: NEGATIVE
Glucose, UA: NEGATIVE
Leukocytes,UA: NEGATIVE
Nitrite, UA: NEGATIVE
Protein,UA: NEGATIVE
RBC, UA: NEGATIVE
Specific Gravity, UA: 1.015 (ref 1.005–1.030)
Urobilinogen, Ur: 0.2 mg/dL (ref 0.2–1.0)
pH, UA: 7 (ref 5.0–7.5)

## 2023-09-20 LAB — MICROSCOPIC EXAMINATION: Bacteria, UA: NONE SEEN

## 2023-09-20 MED ORDER — SOLIFENACIN SUCCINATE 10 MG PO TABS: 10.0000 mg | ORAL_TABLET | Freq: Every day | ORAL | 3 refills | Status: DC

## 2023-09-20 NOTE — Progress Notes (Signed)
 Assessment: 1. BPH with obstruction/lower urinary tract symptoms      Plan: Trial of vesicare 5mg  daily in addition to  current meds. FU 44mo for recheck Ipss and pvr on FU PSA prior to visit  Chief Complaint: voiding issues  History of Present Illness:  Francisco Lopez is a 63 y.o. male who is seen in consultation from [name], NP for evaluation of BPH/LUTS and ED. Patient previously seen by me- last visit 2021 He was initially seen in consultation from Margette Fast for evaluation of voiding and sexual dysfunction 09/2014.  Responded well to daily cialis and tamsulosin. Since his last visit with me he has seen urology at [facility].  Currently reports over last year worsening luts primarily OAB type symptoms (freq/urgency/rare UI/nocutria).  Patient also having frequent loose BM's.  Current ipss= 22  DRE today- 30gm benign feeling prostate      PSA DATA: 02/2015              0.52 07/2016             0.58 01/2018             1.08 02/2019  0.93 03/2020  0.88  10/2021  1.05  Past Medical History:  Past Medical History:  Diagnosis Date   Anxiety    Basal cell carcinoma 2006   BPH with obstruction/lower urinary tract symptoms 10/19/2014   Carpal tunnel syndrome 02/07/2016   Chronic rhinitis 07/24/2016   Depression    Diastolic dysfunction 02/04/2015   Per 2D echo 8/16   ED (erectile dysfunction) of organic origin 09/16/2014   Elevated coronary artery calcium score 02/06/2020   Family history of early CAD 09/23/2021   Frozen shoulder 2018   left   Gastritis    GERD (gastroesophageal reflux disease) 12/27/2015   Herpes simplex type 1 antibody positive 01/28/2015   Herpes simplex type 2 infection 01/28/2015   History of chicken pox    History of obstructive sleep apnea 10/29/2015   HTN (hypertension) 08/05/2014   Hyperlipidemia    Left shoulder pain 02/20/2017   Mood disorder (HCC) 08/25/2016   Osteoarthritis of right hand 03/31/2022    Preventative health care 09/16/2014   Right inguinal pain 10/25/2020    Past Surgical History:  Past Surgical History:  Procedure Laterality Date   COLONOSCOPY     around 2015 [facility] in [place]   KNEE SURGERY Left 2000   meninscus repair   SHOULDER ARTHROSCOPY Left 01/2018   UPPER GASTROINTESTINAL ENDOSCOPY      Allergies:  No Known Allergies  Family History:  Family History  Problem Relation Age of Onset   Diabetes Mother        type II   Anxiety disorder Mother    Hypertension Father    Alcohol abuse Father    Liver cancer Sister    Hemachromatosis Sister    COPD Sister        died from covid complication   Diabetes Mellitus II Sister    Hemachromatosis Brother    Liver cancer Brother    CAD Brother        smoker   CAD Brother        smoker   CAD Paternal Grandmother    COPD Paternal Grandfather    Colon cancer Neg Hx    Esophageal cancer Neg Hx    Stomach cancer Neg Hx    Rectal cancer Neg Hx     Social History:  Social History  Tobacco Use   Smoking status: Never   Smokeless tobacco: Never  Vaping Use   Vaping status: Never Used  Substance Use Topics   Alcohol use: No   Drug use: No    Review of symptoms:  Constitutional:  Negative for unexplained weight loss, night sweats, fever, chills ENT:  Negative for nose bleeds, sinus pain, painful swallowing CV:  Negative for chest pain, shortness of breath, exercise intolerance, palpitations, loss of consciousness Resp:  Negative for cough, wheezing, shortness of breath GI:  Negative for nausea, vomiting, diarrhea, bloody stools GU:  Positives noted in HPI; otherwise negative for gross hematuria, dysuria, urinary incontinence Neuro:  Negative for seizures, poor balance, limb weakness, slurred speech Psych:  Negative for lack of energy, depression, anxiety Endocrine:  Negative for polydipsia, polyuria, symptoms of hypoglycemia (dizziness, hunger, sweating) Hematologic:  Negative for anemia,  purpura, petechia, prolonged or excessive bleeding, use of anticoagulants  Allergic:  Negative for difficulty breathing or choking as a result of exposure to anything; no shellfish allergy; no allergic response (rash/itch) to materials, foods  Physical exam: BP 133/69   Pulse (!) 58   Ht 5\' 11"  (1.803 m)   Wt 191 lb (86.6 kg)   BMI 26.64 kg/m  GENERAL APPEARANCE:  Well appearing, well developed, well nourished, NAD GU:  Nl ext genitalia DRE:  nst, prostate small 20-30gm without n/i  Results: UA clear

## 2023-09-21 ENCOUNTER — Ambulatory Visit: Admitting: Urology

## 2023-09-25 ENCOUNTER — Ambulatory Visit: Payer: BC Managed Care – PPO | Admitting: Family

## 2023-10-02 ENCOUNTER — Ambulatory Visit: Admitting: Family

## 2023-10-02 VITALS — BP 136/74 | HR 58 | Temp 98.4°F | Resp 16 | Ht 71.0 in | Wt 187.0 lb

## 2023-10-02 DIAGNOSIS — B009 Herpesviral infection, unspecified: Secondary | ICD-10-CM

## 2023-10-02 DIAGNOSIS — E119 Type 2 diabetes mellitus without complications: Secondary | ICD-10-CM

## 2023-10-02 DIAGNOSIS — L02419 Cutaneous abscess of limb, unspecified: Secondary | ICD-10-CM

## 2023-10-02 DIAGNOSIS — N401 Enlarged prostate with lower urinary tract symptoms: Secondary | ICD-10-CM

## 2023-10-02 DIAGNOSIS — K2100 Gastro-esophageal reflux disease with esophagitis, without bleeding: Secondary | ICD-10-CM

## 2023-10-02 DIAGNOSIS — F3170 Bipolar disorder, currently in remission, most recent episode unspecified: Secondary | ICD-10-CM

## 2023-10-02 DIAGNOSIS — N138 Other obstructive and reflux uropathy: Secondary | ICD-10-CM

## 2023-10-02 DIAGNOSIS — I10 Essential (primary) hypertension: Secondary | ICD-10-CM

## 2023-10-02 DIAGNOSIS — F32A Depression, unspecified: Secondary | ICD-10-CM

## 2023-10-02 MED ORDER — LUMATEPERONE TOSYLATE 42 MG PO CAPS
42.0000 mg | ORAL_CAPSULE | Freq: Every day | ORAL | Status: DC
Start: 2023-10-02 — End: 2024-04-04

## 2023-10-02 MED ORDER — METOPROLOL SUCCINATE ER 50 MG PO TB24: 50.0000 mg | ORAL_TABLET | Freq: Every day | ORAL | 1 refills | Status: DC

## 2023-10-02 NOTE — Assessment & Plan Note (Signed)
 Valtrex  once daily, stable.

## 2023-10-02 NOTE — Assessment & Plan Note (Addendum)
 Lab Results  Component Value Date   HGBA1C 6.5 08/17/2023   HGBA1C 5.6 06/26/2017   HGBA1C 5.5 01/05/2016   Lab Results  Component Value Date   MICROALBUR 2.0 (H) 08/29/2023   LDLCALC 18 03/27/2023   CREATININE 0.85 09/10/2023   Continues diabetic diet. Encouraged pt to keep up the good work with diet/weight loss.

## 2023-10-02 NOTE — Patient Instructions (Signed)
 VISIT SUMMARY:  Today, you came in for a follow-up on your irritable bladder and chronic diarrhea. We discussed your current medications and their effectiveness, as well as your recent dietary changes and their impact on your symptoms. You have an upcoming appointment with a gastroenterologist to further address your chronic diarrhea. We also reviewed your blood pressure management, mood stabilization, and other health concerns.  YOUR PLAN:  -CHRONIC DIARRHEA: Chronic diarrhea is persistent, frequent loose or watery stools. You have identified that sugar alcohols and sugar-free candies trigger your symptoms, and you have made dietary changes that have helped. You have a gastroenterologist appointment on April 30th to further evaluate and manage this condition.  -OVERACTIVE BLADDER: Overactive bladder is a condition where you feel a sudden urge to urinate that is difficult to control. Your symptoms have improved with Vesicare , and you are advised to elevate your legs before sleep. Continue taking Vesicare  as prescribed.  -GERD: Gastroesophageal reflux disease (GERD) is a condition where stomach acid frequently flows back into the tube connecting your mouth and stomach. Your GERD is well-controlled with dietary changes, and you use pantoprazole  occasionally for triggers. Continue with your dietary modifications and use pantoprazole  as needed.  -HYPERTENSION: Hypertension is high blood pressure. Your blood pressure is well-controlled with your current medications, Toprol  and valsartan . You should discontinue lisinopril  as it is duplicative with valsartan . Continue taking Toprol  and valsartan  as prescribed.  -BIPOLAR DISORDER: Bipolar disorder is a mental health condition that causes extreme mood swings. Your mood is well-controlled with Depakote and Qulipta. You have discontinued Seroquel  due to its adverse effects. Continue taking Depakote and Qulipta as prescribed.  -HERPES SIMPLEX: Herpes simplex is  a viral infection that causes sores, usually around the mouth or genitals. Your condition is well-controlled with daily Valtrex . Continue taking Valtrex  daily as prescribed.  -WELLNESS VISIT: We discussed your recent weight loss and increased energy levels. Continue with your healthy diet and lifestyle changes. Schedule a diabetic eye exam and request a report.  INSTRUCTIONS:  Please attend your gastroenterologist appointment on April 30th. Continue with your current medications and dietary changes as discussed. Schedule a diabetic eye exam and request a report.

## 2023-10-02 NOTE — Assessment & Plan Note (Signed)
 Resolved

## 2023-10-02 NOTE — Assessment & Plan Note (Signed)
 Improved symptoms on vesicare  which is being managed by Urology.

## 2023-10-02 NOTE — Assessment & Plan Note (Signed)
 Stable with dietary change. Only uses pantoprazole  prn.

## 2023-10-02 NOTE — Progress Notes (Signed)
 Subjective:     Patient ID: Francisco Lopez, male    DOB: 03-02-1961, 63 y.o.   MRN: [id number]  Chief Complaint  Patient presents with   Hypertension    Here for follow up   Hyperlipidemia    Here for follow up    Hypertension  Hyperlipidemia    Discussed the use of AI scribe software for clinical note transcription with the patient, who gave verbal consent to proceed.  History of Present Illness  Francisco Lopez is a 63 year old male who presents for follow-up.  He was previously prescribed medication for an irritable or overactive bladder, which was also expected to help with his diarrhea. He has identified that sugar alcohols and sugar-free candies exacerbate his diarrhea, leading him to avoid these products. He has lost weight, dropping from 206 pounds to 187 pounds, and notes an increase in energy levels since making dietary changes. He has an upcoming appointment with a gastroenterologist on the 30th to address his chronic diarrhea.  He is currently taking Vesicare  for bladder symptoms and reports improvement, noting that he sometimes can go a night without needing to urinate.  He has transitioned from Seroquel  to Depakote for mood stabilization per psychiatry, which he reports has improved his mood. He also takes Qulipta 42 mg once daily, primarily at night, to help with sleep and calming. He no longer experiences the negative effects he felt with Seroquel , such as feeling groggy the next day.  He takes Valtrex  daily to prevent breakouts and reports it is effective. He is on Toprol  and Diovan  (valsartan  320 mg) for blood pressure management. He was previously on lisinopril  2.5 mg but has stopped it. He occasionally takes pantoprazole  for acid reflux, especially when consuming foods like pizza, but has noted significant improvement since switching to almond milk and avoiding dairy.   Health Maintenance Due  Topic Date Due   OPHTHALMOLOGY EXAM  Never done   Pneumococcal Vaccine  68-36 Years old (1 of 2 - PCV) Never done    Past Medical History:  Diagnosis Date   Anxiety    Basal cell carcinoma 2006   BPH with obstruction/lower urinary tract symptoms 10/19/2014   Carpal tunnel syndrome 02/07/2016   Chronic rhinitis 07/24/2016   Depression    Diastolic dysfunction 02/04/2015   Per 2D echo 8/16   ED (erectile dysfunction) of organic origin 09/16/2014   Elevated coronary artery calcium  score 02/06/2020   Family history of early CAD 09/23/2021   Frozen shoulder 2018   left   Gastritis    GERD (gastroesophageal reflux disease) 12/27/2015   Herpes simplex type 1 antibody positive 01/28/2015   Herpes simplex type 2 infection 01/28/2015   History of chicken pox    History of obstructive sleep apnea 10/29/2015   HTN (hypertension) 08/05/2014   Hyperlipidemia    Left shoulder pain 02/20/2017   Mood disorder (HCC) 08/25/2016   Osteoarthritis of right hand 03/31/2022   Preventative health care 09/16/2014   Right inguinal pain 10/25/2020    Past Surgical History:  Procedure Laterality Date   COLONOSCOPY     around 2015 [facility] in [place]   KNEE SURGERY Left 2000   meninscus repair   SHOULDER ARTHROSCOPY Left 01/2018   UPPER GASTROINTESTINAL ENDOSCOPY      Family History  Problem Relation Age of Onset   Diabetes Mother        type II   Anxiety disorder Mother    Hypertension Father    Alcohol abuse Father  Liver cancer Sister    Hemachromatosis Sister    COPD Sister        died from covid complication   Diabetes Mellitus II Sister    Hemachromatosis Brother    Liver cancer Brother    CAD Brother        smoker   CAD Brother        smoker   CAD Paternal Grandmother    COPD Paternal Grandfather    Colon cancer Neg Hx    Esophageal cancer Neg Hx    Stomach cancer Neg Hx    Rectal cancer Neg Hx     Social History   Socioeconomic History   Marital status: Divorced    Spouse name: Not on file   Number of children: Not on file    Years of education: Not on file   Highest education level: 8th grade  Occupational History   Not on file  Tobacco Use   Smoking status: Never   Smokeless tobacco: Never  Vaping Use   Vaping status: Never Used  Substance and Sexual Activity   Alcohol use: No   Drug use: No   Sexual activity: Yes  Other Topics Concern   Not on file  Social History Narrative   Separated   1 child (65 yr old daughter)- lives in [place]   Drives 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[employer], 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   Social Drivers of Health   Financial Resource Strain: Low Risk  (09/27/2022)   Overall Financial Resource Strain (CARDIA)    Difficulty of Paying Living Expenses: Not hard at all  Food Insecurity: No Food Insecurity (09/27/2022)   Hunger Vital Sign    Worried About Running Out of Food in the Last Year: Never true    Ran Out of Food in the Last Year: Never true  Transportation Needs: Unknown (09/27/2022)   PRAPARE - Administrator, Civil Service (Medical): Not on file    Lack of Transportation (Non-Medical): Patient declined  Physical Activity: Sufficiently Active (06/19/2023)   Exercise Vital Sign    Days of Exercise per Week: 7 days    Minutes of Exercise per Session: 30 min  Stress: No Stress Concern Present (06/19/2023)   [employer] of Occupational Health - Occupational Stress Questionnaire    Feeling of Stress : Not at all  Social Connections: Not on file  Intimate Partner Violence: Not on file    Outpatient Medications Prior to Visit  Medication Sig Dispense Refill   albuterol  (VENTOLIN  HFA) 108 (90 Base) MCG/ACT inhaler Inhale 2 puffs into the lungs every 6 (six) hours as needed for wheezing or shortness of breath. 6.7 g 0   amLODipine  (NORVASC ) 10 MG tablet TAKE 1 TABLET BY MOUTH EVERY DAY 90 tablet 1   aspirin  EC 81 MG tablet Take 1 tablet (81 mg total) by mouth daily. Swallow whole. 90 tablet 3   atorvastatin  (LIPITOR) 40 MG tablet Take 1 tablet (40 mg total) by mouth every  other day. 45 tablet 3   divalproex (DEPAKOTE ER) 500 MG 24 hr tablet Take 1,000 mg by mouth 2 (two) times daily.     Evolocumab  (REPATHA ) 140 MG/ML SOSY Inject 140 mg into the skin every 14 (fourteen) days. 6 mL 2   Multiple Vitamins-Minerals (MENS MULTIVITAMIN PLUS PO) Take 1 tablet by mouth daily in the afternoon.     Omega-3 Fatty Acids (FISH OIL ) 1000 MG CAPS Take 2 capsules (2,000 mg total) by mouth  2 (two) times daily. 180 capsule 3   pantoprazole  (PROTONIX ) 20 MG tablet TAKE 1 TABLET BY MOUTH EVERY DAY 90 tablet 0   sertraline (ZOLOFT) 100 MG tablet Take 100 mg by mouth daily.     solifenacin  (VESICARE ) 10 MG tablet Take 1 tablet (10 mg total) by mouth daily. 90 tablet 3   tadalafil  (CIALIS ) 5 MG tablet TAKE ONE TABLET ONCE DAILY. IF NEEDED MAY TAKE 5 MG ADDITIONAL DOSE PRIOR TO INTERCOURSE 90 tablet 1   tamsulosin (FLOMAX) 0.4 MG CAPS capsule Take 0.4 mg by mouth daily.     valACYclovir  (VALTREX ) 500 MG tablet TAKE 1 TABLET (500 MG TOTAL) BY MOUTH DAILY. 90 tablet 1   valsartan  (DIOVAN ) 320 MG tablet Take 1 tablet (320 mg total) by mouth daily. 90 tablet 1   lisinopril  (ZESTRIL ) 2.5 MG tablet Take 1 tablet (2.5 mg total) by mouth daily. 30 tablet 3   metoprolol  succinate (TOPROL  XL) 50 MG 24 hr tablet Take 1 tablet (50 mg total) by mouth daily. Take with or immediately following a meal. 90 tablet 1   cephALEXin  (KEFLEX ) 500 MG capsule Take 1 capsule (500 mg total) by mouth 3 (three) times daily. 21 capsule 0   QUEtiapine  (SEROQUEL ) 400 MG tablet Take 400 mg by mouth at bedtime.     No facility-administered medications prior to visit.    No Known Allergies  ROS See HPI    Objective:    Physical Exam Constitutional:      General: He is not in acute distress.    Appearance: He is well-developed.  HENT:     Head: Normocephalic and atraumatic.  Cardiovascular:     Rate and Rhythm: Normal rate and regular rhythm.     Heart sounds: No murmur heard. Pulmonary:     Effort:  Pulmonary effort is normal. No respiratory distress.     Breath sounds: Normal breath sounds. No wheezing or rales.  Skin:    General: Skin is warm and dry.  Neurological:     Mental Status: He is alert and oriented to person, place, and time.  Psychiatric:        Behavior: Behavior normal.        Thought Content: Thought content normal.      BP 136/74 (BP Location: Right Arm, Patient Position: Sitting, Cuff Size: Normal)   Pulse (!) 58   Temp 98.4 F (36.9 C) (Oral)   Resp 16   Ht 5\' 11"  (1.803 m)   Wt 187 lb (84.8 kg)   SpO2 98%   BMI 26.08 kg/m  Wt Readings from Last 3 Encounters:  10/02/23 187 lb (84.8 kg)  09/20/23 191 lb (86.6 kg)  08/17/23 207 lb (93.9 kg)       Assessment & Plan:   Problem List Items Addressed This Visit       High   BPH with obstruction/lower urinary tract symptoms - Primary   Improved symptoms on vesicare  which is being managed by Urology.        Unprioritized   HTN (hypertension)   BP Readings from Last 3 Encounters:  10/02/23 136/74  09/20/23 133/69  08/17/23 (!) 140/75   Maintained on toprol  xl, diovan .        Relevant Medications   metoprolol  succinate (TOPROL  XL) 50 MG 24 hr tablet   Herpes simplex type 2 infection   Valtrex  once daily, stable.       GERD (gastroesophageal reflux disease)   Stable with dietary change. Only  uses pantoprazole  prn.      Depression   Stable on depakote. Management per       Controlled type 2 diabetes mellitus without complication, without long-term current use of insulin (HCC)   Lab Results  Component Value Date   HGBA1C 6.5 08/17/2023   HGBA1C 5.6 06/26/2017   HGBA1C 5.5 01/05/2016   Lab Results  Component Value Date   MICROALBUR 2.0 (H) 08/29/2023   LDLCALC 18 03/27/2023   CREATININE 0.85 09/10/2023   Continues diabetic diet. Encouraged pt to keep up the good work with diet/weight loss.       Axillary abscess   Resolved.      Other Visit Diagnoses       Bipolar  affective disorder in remission (HCC)       Relevant Medications   lumateperone  tosylate (CAPLYTA ) 42 MG capsule       I have discontinued Francisco Lopez's QUEtiapine , cephALEXin , and lisinopril . I am also having him start on lumateperone  tosylate. Additionally, I am having him maintain his Multiple Vitamins-Minerals (MENS MULTIVITAMIN PLUS PO), aspirin  EC, Fish Oil , pantoprazole , sertraline, tamsulosin, divalproex, atorvastatin , Repatha , tadalafil , amLODipine , valACYclovir , albuterol , valsartan , solifenacin , and metoprolol  succinate.  Meds ordered this encounter  Medications   metoprolol  succinate (TOPROL  XL) 50 MG 24 hr tablet    Sig: Take 1 tablet (50 mg total) by mouth daily. Take with or immediately following a meal.    Dispense:  90 tablet    Refill:  1    Supervising Provider:   Randie Bustle A [[id number]]   lumateperone  tosylate (CAPLYTA ) 42 MG capsule    Sig: Take 1 capsule (42 mg total) by mouth daily.    Supervising Provider:   Randie Bustle A [phone]

## 2023-10-02 NOTE — Assessment & Plan Note (Signed)
 Stable on depakote. Management per

## 2023-10-02 NOTE — Assessment & Plan Note (Signed)
 BP Readings from Last 3 Encounters:  10/02/23 136/74  09/20/23 133/69  08/17/23 (!) 140/75   Maintained on toprol  xl, diovan .

## 2023-10-10 ENCOUNTER — Ambulatory Visit: Admitting: Gastroenterology

## 2023-10-10 ENCOUNTER — Encounter: Payer: Self-pay | Admitting: Gastroenterology

## 2023-10-10 ENCOUNTER — Other Ambulatory Visit (INDEPENDENT_AMBULATORY_CARE_PROVIDER_SITE_OTHER)

## 2023-10-10 VITALS — BP 132/70 | HR 58 | Ht 71.0 in | Wt 185.5 lb

## 2023-10-10 DIAGNOSIS — R197 Diarrhea, unspecified: Secondary | ICD-10-CM

## 2023-10-10 DIAGNOSIS — R152 Fecal urgency: Secondary | ICD-10-CM

## 2023-10-10 DIAGNOSIS — R143 Flatulence: Secondary | ICD-10-CM | POA: Diagnosis not present

## 2023-10-10 DIAGNOSIS — R1032 Left lower quadrant pain: Secondary | ICD-10-CM

## 2023-10-10 LAB — LIPASE: Lipase: 26 U/L (ref 11.0–59.0)

## 2023-10-10 LAB — CBC WITH DIFFERENTIAL/PLATELET
Basophils Absolute: 0 10*3/uL (ref 0.0–0.1)
Basophils Relative: 0.4 % (ref 0.0–3.0)
Eosinophils Absolute: 0 10*3/uL (ref 0.0–0.7)
Eosinophils Relative: 0.6 % (ref 0.0–5.0)
HCT: 45.8 % (ref 39.0–52.0)
Hemoglobin: 15.5 g/dL (ref 13.0–17.0)
Lymphocytes Relative: 44.3 % (ref 12.0–46.0)
Lymphs Abs: 2.8 10*3/uL (ref 0.7–4.0)
MCHC: 33.8 g/dL (ref 30.0–36.0)
MCV: 92.5 fl (ref 78.0–100.0)
Monocytes Absolute: 0.5 10*3/uL (ref 0.1–1.0)
Monocytes Relative: 7.6 % (ref 3.0–12.0)
Neutro Abs: 3 10*3/uL (ref 1.4–7.7)
Neutrophils Relative %: 47.1 % (ref 43.0–77.0)
Platelets: 131 10*3/uL — ABNORMAL LOW (ref 150.0–400.0)
RBC: 4.95 Mil/uL (ref 4.22–5.81)
RDW: 13.5 % (ref 11.5–15.5)
WBC: 6.3 10*3/uL (ref 4.0–10.5)

## 2023-10-10 LAB — SEDIMENTATION RATE: Sed Rate: 2 mm/h (ref 0–20)

## 2023-10-10 LAB — C-REACTIVE PROTEIN: CRP: <1 mg/dL (ref 0.5–20.0)

## 2023-10-10 NOTE — Patient Instructions (Signed)
 Recommend OTC probiotic VSL #3  ([employer] or [employer]) or online take 1 capsule twice daily for 1-2 months Continue to avoid dairy, caffeine, and fructose  Your provider has requested that you go to the basement level for lab work before leaving today. Press "B" on the elevator. The lab is located at the first door on the left as you exit the elevator.  You have been scheduled for a CT scan of the abdomen and pelvis at [facility], 1st floor Radiology. You are scheduled on 10/12/23 at 1:00pm. You should arrive 15 minutes prior to your appointment time for registration.    You may take any medications as prescribed with a small amount of water, if necessary. If you take any of the following medications: METFORMIN, GLUCOPHAGE, GLUCOVANCE, AVANDAMET, RIOMET, FORTAMET, ACTOPLUS MET, JANUMET, GLUMETZA or METAGLIP, you MAY be asked to HOLD this medication 48 hours AFTER the exam.   The purpose of you drinking the oral contrast is to aid in the visualization of your intestinal tract. The contrast solution may cause some diarrhea. Depending on your individual set of symptoms, you may also receive an intravenous injection of x-ray contrast/dye. Plan on being at [facility] for 45 minutes or longer, depending on the type of exam you are having performed.   If you have any questions regarding your exam or if you need to reschedule, you may call [facility] at [phone] between the hours of 8:00 am and 5:00 pm, Monday-Friday.   _______________________________________________________  If your blood pressure at your visit was 140/90 or greater, please contact your primary care physician to follow up on this.  _______________________________________________________  If you are age 63 or older, your body mass index should be between 23-30. Your Body mass index is 25.87 kg/m. If this is out of the aforementioned range listed, please consider follow up with your Primary Care Provider.  If you are  age 47 or younger, your body mass index should be between 19-25. Your Body mass index is 25.87 kg/m. If this is out of the aformentioned range listed, please consider follow up with your Primary Care Provider.   ________________________________________________________  The [facility] providers would like to encourage you to use MYCHART to communicate with providers for non-urgent requests or questions.  Due to long hold times on the telephone, sending your provider a message by [facility] may be a faster and more efficient way to get a response.  Please allow 48 business hours for a response.  Please remember that this is for non-urgent requests.  _______________________________________________________ Thank you for trusting me with your gastrointestinal care!   Dyanna Glasgow, RNP

## 2023-10-10 NOTE — Progress Notes (Signed)
 Chief Complaint:chronic diarrhea Primary GI Doctor:Dr. Karene Oto  HPI:  Francisco Lopez is a 63 y.o. male known to Dr. Karene Oto with a past medical history of HTN, HLD, mood disorder, depression, basal cell carcinoma, BPH, GERD with severe erosive esophagitis, and prediabetes.  Additional medical history as listed in PMH.  Last seen by Maureen Sour, NP on 10/28/2021.  ---After high-dose PPI the erosive esophagitis had resolved on last EGD June 2022.  For the most part his GERD symptoms are under control with only 20 mg of pantoprazole  daily .  He has had a few episodes of nocturnal regurgitation over the last year.   Francisco Lopez h as been following with Dr Karene Oto for GERD with severe reflux esophagitis. He had an EGD with empiric dilation in March 2022.  EGD at that time remarkable for gastritis and severe erosive esophagitis.  He was treated with high-dose PPI and antireflux measures .  Follow-up EGD in June 2022 showed esophagitis had resolved and there was near resolution of gastritis. Dose of Pantoprazole  was reduced to 40 mg a day.  He was seen in office October 2022, was still doing well and pantoprazole  was further decreased to 20 mg daily.     His colonoscopy in March 2022 was normal.  There was some erythema at ICV but biopsies negative.  10-year follow-up colonoscopy recommended   Interval History     Patient presents for main complaint of diarrhea over the past 2 months. Patient reports he was having 2-3 loose stools per day with urgency. Intermittent BRB with wiping due to irritation noted from hemorrhoids. He notes the stool is fluffy and floats on top of water. He also has a lot of gas. He has LLQ abdominal pain that is intermittent, but at times is severe enough he has to get up and move around. He rates it 7/10 on pain scale. No exposure. No recent travel. No viral illness. No new medications. No history of constipation. He did cut out caffeine, dairy, and fruits to see if it would help,  which it did help and also decreased his reflux. He has lost 20lbs in the past month but states he has significantly changed his diet due to prediabetes.     Patient recently saw urologist for over active bladder and prescribed solifenacin  succinate which he states has slowed down the diarrhea. Patient states currently he will have two episodes a week where he has diarrhea with urgency. 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     Patient has history of GERD and was taking pantoprazole  20mg  po daily, however states since he changed his diet and lost weight his symptoms have resolved.   Patients brother passed with liver CA from hemochromatosis. His sister had the hemochromatosis gene.   Wt Readings from Last 3 Encounters:  10/10/23 185 lb 8 oz (84.1 kg)  10/02/23 187 lb (84.8 kg)  09/20/23 191 lb (86.6 kg)     Past Medical History:  Diagnosis Date   Anxiety    Basal cell carcinoma 2006   BPH with obstruction/lower urinary tract symptoms 10/19/2014   Carpal tunnel syndrome 02/07/2016   Chronic rhinitis 07/24/2016   Depression    Diastolic dysfunction 02/04/2015   Per 2D echo 8/16   ED (erectile dysfunction) of organic origin 09/16/2014   Elevated coronary artery calcium  score 02/06/2020   Family history of early CAD 09/23/2021   Frozen shoulder 2018   left   Gastritis    GERD (gastroesophageal reflux disease) 12/27/2015  Herpes simplex type 1 antibody positive 01/28/2015   Herpes simplex type 2 infection 01/28/2015   History of chicken pox    History of obstructive sleep apnea 10/29/2015   HTN (hypertension) 08/05/2014   Hyperlipidemia    Left shoulder pain 02/20/2017   Mood disorder (HCC) 08/25/2016   Osteoarthritis of right hand 03/31/2022   Preventative health care 09/16/2014   Right inguinal pain 10/25/2020    Past Surgical History:  Procedure Laterality Date   COLONOSCOPY     around 2015 [facility] in [place]   KNEE SURGERY Left 2000   meninscus repair    SHOULDER ARTHROSCOPY Left 01/2018   UPPER GASTROINTESTINAL ENDOSCOPY      Current Outpatient Medications  Medication Sig Dispense Refill   albuterol  (VENTOLIN  HFA) 108 (90 Base) MCG/ACT inhaler Inhale 2 puffs into the lungs every 6 (six) hours as needed for wheezing or shortness of breath. 6.7 g 0   amLODipine  (NORVASC ) 10 MG tablet TAKE 1 TABLET BY MOUTH EVERY DAY 90 tablet 1   aspirin  EC 81 MG tablet Take 1 tablet (81 mg total) by mouth daily. Swallow whole. 90 tablet 3   atorvastatin  (LIPITOR) 40 MG tablet Take 1 tablet (40 mg total) by mouth every other day. 45 tablet 3   divalproex (DEPAKOTE ER) 500 MG 24 hr tablet Take 1,000 mg by mouth 2 (two) times daily.     Evolocumab  (REPATHA ) 140 MG/ML SOSY Inject 140 mg into the skin every 14 (fourteen) days. 6 mL 2   lumateperone  tosylate (CAPLYTA ) 42 MG capsule Take 1 capsule (42 mg total) by mouth daily.     metoprolol  succinate (TOPROL  XL) 50 MG 24 hr tablet Take 1 tablet (50 mg total) by mouth daily. Take with or immediately following a meal. 90 tablet 1   Multiple Vitamins-Minerals (MENS MULTIVITAMIN PLUS PO) Take 1 tablet by mouth daily in the afternoon.     Omega-3 Fatty Acids (FISH OIL ) 1000 MG CAPS Take 2 capsules (2,000 mg total) by mouth 2 (two) times daily. 180 capsule 3   pantoprazole  (PROTONIX ) 20 MG tablet TAKE 1 TABLET BY MOUTH EVERY DAY 90 tablet 0   sertraline (ZOLOFT) 100 MG tablet Take 100 mg by mouth daily.     solifenacin  (VESICARE ) 10 MG tablet Take 1 tablet (10 mg total) by mouth daily. 90 tablet 3   tadalafil  (CIALIS ) 5 MG tablet TAKE ONE TABLET ONCE DAILY. IF NEEDED Precious Gilchrest TAKE 5 MG ADDITIONAL DOSE PRIOR TO INTERCOURSE 90 tablet 1   tamsulosin (FLOMAX) 0.4 MG CAPS capsule Take 0.4 mg by mouth daily.     valACYclovir  (VALTREX ) 500 MG tablet TAKE 1 TABLET (500 MG TOTAL) BY MOUTH DAILY. 90 tablet 1   valsartan  (DIOVAN ) 320 MG tablet Take 1 tablet (320 mg total) by mouth daily. 90 tablet 1   No current facility-administered  medications for this visit.    Allergies as of 10/10/2023   (No Known Allergies)    Family History  Problem Relation Age of Onset   Diabetes Mother        type II   Anxiety disorder Mother    Hypertension Father    Alcohol abuse Father    Liver cancer Sister    Hemachromatosis Sister    COPD Sister        died from covid complication   Diabetes Mellitus II Sister    Hemachromatosis Brother    Liver cancer Brother    CAD Brother  smoker   CAD Brother        smoker   CAD Paternal Grandmother    COPD Paternal Grandfather    Colon cancer Neg Hx    Esophageal cancer Neg Hx    Stomach cancer Neg Hx    Rectal cancer Neg Hx     Review of Systems:    Constitutional: No weight loss, fever, chills, weakness or fatigue HEENT: Eyes: No change in vision               Ears, Nose, Throat:  No change in hearing or congestion Skin: No rash or itching Cardiovascular: No chest pain, chest pressure or palpitations   Respiratory: No SOB or cough Gastrointestinal: See HPI and otherwise negative Genitourinary: No dysuria or change in urinary frequency Neurological: No headache, dizziness or syncope Musculoskeletal: No new muscle or joint pain Hematologic: No bleeding or bruising Psychiatric: No history of depression or anxiety    Physical Exam:  Vital signs: BP 132/70   Pulse (!) 58   Ht 5\' 11"  (1.803 m)   Wt 185 lb 8 oz (84.1 kg)   SpO2 95%   BMI 25.87 kg/m   Constitutional:   Pleasant  male appears to be in NAD, Well developed, Well nourished, alert and cooperative Throat: Oral cavity and pharynx without inflammation, swelling or lesion.  Respiratory: Respirations even and unlabored. Lungs clear to auscultation bilaterally.   No wheezes, crackles, or rhonchi.  Cardiovascular: Normal S1, S2. Regular rate and rhythm. No peripheral edema, cyanosis or pallor.  Gastrointestinal:  Soft, nondistended, LLQ abdominal tenderness. No rebound or guarding. Normal bowel sounds. No  appreciable masses or hepatomegaly. Rectal:  Not performed.  Msk:  Symmetrical without gross deformities. Without edema, no deformity or joint abnormality.  Neurologic:  Alert and  oriented x4;  grossly normal neurologically.  Skin:   Dry and intact without significant lesions or rashes. Psychiatric: Oriented to person, place and time. Demonstrates good judgement and reason without abnormal affect or behaviors.  RELEVANT LABS AND IMAGING: CBC    Latest Ref Rng & Units 06/01/2020    4:22 PM 08/01/2019    1:53 PM 08/27/2017    7:51 AM  CBC  WBC 4.0 - 10.5 K/uL 7.0  6.7  5.4   Hemoglobin 13.0 - 17.0 g/dL 46.9  62.9  52.8   Hematocrit 39.0 - 52.0 % 46.2  46.0  45.1   Platelets 150.0 - 400.0 K/uL 162.0  145.0  150.0      CMP     Latest Ref Rng & Units 09/10/2023    2:48 PM 08/17/2023    9:09 AM 03/27/2023    1:44 PM  CMP  Glucose 70 - 99 mg/dL 97  413  244   BUN 6 - 23 mg/dL 17  18  14    Creatinine 0.40 - 1.50 mg/dL 0.10  2.72  5.36   Sodium 135 - 145 mEq/L 138  140  138   Potassium 3.5 - 5.1 mEq/L 4.2  4.4  4.3   Chloride 96 - 112 mEq/L 104  103  102   CO2 19 - 32 mEq/L 25  28  27    Calcium  8.4 - 10.5 mg/dL 9.3  9.8  9.2   Total Protein 6.0 - 8.3 g/dL   6.7   Total Bilirubin 0.2 - 1.2 mg/dL   0.4   Alkaline Phos 39 - 117 U/L   38   AST 0 - 37 U/L   25   ALT  0 - 53 U/L   36      Lab Results  Component Value Date   TSH 1.410 02/17/2020  08/20/2018 5 GI profile negative  Assessment: Encounter Diagnoses  Name Primary?   LLQ abdominal pain Yes   Diarrhea, unspecified type    Flatulence    Fecal urgency      63 year old male patient that presents for acute diarrhea with urgency.  GI profile was negative for viral, bacterial, or parasitic causes.  Due to the description of his stool and urgency symptoms we will go ahead and order lab work and stool test to rule out inflammatory disease and/or exocrine pancreatic insufficiency.  I will also order CT scan abdomen and pelvis to  evaluate the pain in the left lower quadrant.  Patient advised to start over-the-counter probiotic for the next 1 to 2 months and to continue to avoid food triggers.  If negative workup Daneil Beem consider adding antispasmodic like dicyclomine.  We discussed possibly doing colonoscopy with random biopsy if his symptoms continue and workup negative.His colonoscopy in March 2022 was normal.  There was some erythema at ICV but biopsies negative.  Plan: -check CBC, CRP, ESR, lipase today -Check Stool elastase, fecal calprotectin   -Order CT scan ABD/pelvis for LLQ abdominal pain  -Recommend OTC probiotic VSL #3  -continue with dietary modifcations- cutting out caffeine, dairy, and fructose.  Thank you for the courtesy of this consult. Please call me with any questions or concerns.   Berma Harts, FNP-C [facility] 10/10/2023, 9:42 AM  Cc: Dorrene Gaucher, NP

## 2023-10-12 ENCOUNTER — Ambulatory Visit (HOSPITAL_COMMUNITY)
Admission: RE | Admit: 2023-10-12 | Discharge: 2023-10-12 | Disposition: A | Discharge status: Home / Self Care | Source: Ambulatory Visit | Attending: Gastroenterology

## 2023-10-12 ENCOUNTER — Other Ambulatory Visit

## 2023-10-12 ENCOUNTER — Ambulatory Visit: Admitting: Urology

## 2023-10-12 DIAGNOSIS — R197 Diarrhea, unspecified: Secondary | ICD-10-CM | POA: Diagnosis not present

## 2023-10-12 DIAGNOSIS — N400 Enlarged prostate without lower urinary tract symptoms: Secondary | ICD-10-CM | POA: Diagnosis not present

## 2023-10-12 DIAGNOSIS — R152 Fecal urgency: Secondary | ICD-10-CM | POA: Diagnosis not present

## 2023-10-12 DIAGNOSIS — R143 Flatulence: Secondary | ICD-10-CM | POA: Diagnosis not present

## 2023-10-12 DIAGNOSIS — R1032 Left lower quadrant pain: Secondary | ICD-10-CM | POA: Diagnosis not present

## 2023-10-12 MED ORDER — SODIUM CHLORIDE (PF) 0.9 % IJ SOLN
INTRAMUSCULAR | Status: AC
  Filled 2023-10-12: qty 50

## 2023-10-12 MED ORDER — IOHEXOL 300 MG/ML  SOLN
100.0000 mL | Freq: Once | INTRAMUSCULAR | Status: AC | PRN
  Administered 2023-10-12: 100 mL via INTRAVENOUS

## 2023-10-15 LAB — CALPROTECTIN, FECAL: Calprotectin, Fecal: <5 ug/g (ref 0–120)

## 2023-10-19 LAB — PANCREATIC ELASTASE, FECAL: Pancreatic Elastase-1, Stool: >800 ug/g (ref >200)

## 2023-10-22 DIAGNOSIS — H52223 Regular astigmatism, bilateral: Secondary | ICD-10-CM | POA: Diagnosis not present

## 2023-10-22 DIAGNOSIS — H524 Presbyopia: Secondary | ICD-10-CM | POA: Diagnosis not present

## 2023-10-24 NOTE — Progress Notes (Signed)
 Agree with the assessment and plan as outlined by [facility], FNP-C.  Francisco Bottino, DO, [facility]

## 2023-11-06 ENCOUNTER — Other Ambulatory Visit (HOSPITAL_COMMUNITY): Payer: Self-pay

## 2023-11-06 ENCOUNTER — Telehealth: Payer: Self-pay

## 2023-11-06 NOTE — Telephone Encounter (Signed)
 Pharmacy Patient Advocate Encounter   Received notification from CoverMyMeds that prior authorization for REPATHA  is required/requested.   Insurance verification completed.   The patient is insured through [facility] .   Per test claim: PA required; PA submitted to above mentioned insurance via CoverMyMeds Key/confirmation #/EOC [id number] Status is pending

## 2023-11-07 ENCOUNTER — Other Ambulatory Visit (HOSPITAL_COMMUNITY): Payer: Self-pay

## 2023-11-07 NOTE — Telephone Encounter (Signed)
 Pharmacy Patient Advocate Encounter  Received notification from [employer] that Prior Authorization for repatha  has been APPROVED from 09/07/23 to 05/08/24. Spoke to pharmacy to process.Copay is $24.99.

## 2023-12-04 DIAGNOSIS — L821 Other seborrheic keratosis: Secondary | ICD-10-CM | POA: Diagnosis not present

## 2023-12-04 DIAGNOSIS — C44219 Basal cell carcinoma of skin of left ear and external auricular canal: Secondary | ICD-10-CM | POA: Diagnosis not present

## 2023-12-04 DIAGNOSIS — L570 Actinic keratosis: Secondary | ICD-10-CM | POA: Diagnosis not present

## 2023-12-04 DIAGNOSIS — L565 Disseminated superficial actinic porokeratosis (DSAP): Secondary | ICD-10-CM | POA: Diagnosis not present

## 2023-12-06 DIAGNOSIS — F4322 Adjustment disorder with anxiety: Secondary | ICD-10-CM | POA: Diagnosis not present

## 2023-12-06 DIAGNOSIS — F4312 Post-traumatic stress disorder, chronic: Secondary | ICD-10-CM | POA: Diagnosis not present

## 2023-12-19 ENCOUNTER — Other Ambulatory Visit: Payer: Self-pay

## 2023-12-19 DIAGNOSIS — N138 Other obstructive and reflux uropathy: Secondary | ICD-10-CM

## 2023-12-20 ENCOUNTER — Ambulatory Visit: Admitting: Urology

## 2023-12-26 ENCOUNTER — Other Ambulatory Visit: Payer: Self-pay

## 2023-12-26 ENCOUNTER — Other Ambulatory Visit

## 2023-12-26 DIAGNOSIS — N401 Enlarged prostate with lower urinary tract symptoms: Secondary | ICD-10-CM | POA: Diagnosis not present

## 2023-12-26 DIAGNOSIS — N138 Other obstructive and reflux uropathy: Secondary | ICD-10-CM

## 2023-12-27 LAB — PSA: Prostate Specific Ag, Serum: 1.3 ng/mL (ref 0.0–4.0)

## 2023-12-28 ENCOUNTER — Other Ambulatory Visit: Payer: Self-pay | Admitting: Cardiology

## 2023-12-28 DIAGNOSIS — R931 Abnormal findings on diagnostic imaging of heart and coronary circulation: Secondary | ICD-10-CM

## 2023-12-28 DIAGNOSIS — I25118 Atherosclerotic heart disease of native coronary artery with other forms of angina pectoris: Secondary | ICD-10-CM

## 2023-12-28 DIAGNOSIS — E7841 Elevated Lipoprotein(a): Secondary | ICD-10-CM

## 2024-01-02 ENCOUNTER — Encounter: Payer: Self-pay | Admitting: Urology

## 2024-01-02 ENCOUNTER — Ambulatory Visit: Admitting: Urology

## 2024-01-02 VITALS — BP 137/81 | HR 62 | Ht 69.0 in | Wt 184.0 lb

## 2024-01-02 DIAGNOSIS — R351 Nocturia: Secondary | ICD-10-CM

## 2024-01-02 DIAGNOSIS — N401 Enlarged prostate with lower urinary tract symptoms: Secondary | ICD-10-CM

## 2024-01-02 DIAGNOSIS — N138 Other obstructive and reflux uropathy: Secondary | ICD-10-CM | POA: Diagnosis not present

## 2024-01-02 LAB — URINALYSIS, ROUTINE W REFLEX MICROSCOPIC
Bilirubin, UA: NEGATIVE
Glucose, UA: NEGATIVE
Leukocytes,UA: NEGATIVE
Nitrite, UA: NEGATIVE
Protein,UA: NEGATIVE
RBC, UA: NEGATIVE
Specific Gravity, UA: 1.025 (ref 1.005–1.030)
Urobilinogen, Ur: 0.2 mg/dL (ref 0.2–1.0)
pH, UA: 6 (ref 5.0–7.5)

## 2024-01-02 LAB — MICROSCOPIC EXAMINATION

## 2024-01-02 LAB — BLADDER SCAN AMB NON-IMAGING

## 2024-01-02 MED ORDER — MIRABEGRON ER 50 MG PO TB24: 50.0000 mg | ORAL_TABLET | Freq: Every day | ORAL | 11 refills | Status: DC

## 2024-01-02 NOTE — Progress Notes (Signed)
 Assessment: 1. BPH with obstruction/lower urinary tract symptoms   2. Nocturia     Plan: I personally reviewed the patient's chart including provider notes, and lab results. Voiding diary x 3 days. Trial of Myrbetriq  50 mg nightly in place of solifenacin .  Prescription sent. Return to office in 6 weeks.  Chief Complaint: Chief Complaint  Patient presents with   Benign Prostatic Hypertrophy    HPI: Francisco Lopez is a 63 y.o. male who presents for continued evaluation of BPH with lower urinary tract symptoms and ED. He was previously followed by Dr. Shona and was last seen in April 2025. At that time, he was having increased lower urinary tract symptoms with increased frequency, urgency, nocturia, and rare urge incontinence. IPSS = 22 DRE demonstrated a 30 g benign feeling prostate. He was started on Vesicare  5 mg daily and continued on tamsulosin 0.4 mg daily as well as tadalafil  5 mg daily.  PSA DATA: 02/2015              0.52 07/2016             0.58 01/2018             1.08 02/2019             0.93 03/2020           0.88  10/2021             1.05 7/25   1.3  He returns today for follow-up.  He continues on tamsulosin and solifenacin  10 mg.  He initially felt like the solifenacin  improved his nocturia.  He has noted some improvement in his daytime urgency.  He continues with nocturia x 2 which is the most bothersome symptom for him.  No dysuria or gross hematuria. IPSS = 14/3.  Portions of the above documentation were copied from a prior visit for review purposes only.  Allergies: No Known Allergies  PMH: Past Medical History:  Diagnosis Date   Anxiety    Basal cell carcinoma 2006   BPH with obstruction/lower urinary tract symptoms 10/19/2014   Carpal tunnel syndrome 02/07/2016   Chronic rhinitis 07/24/2016   Depression    Diastolic dysfunction 02/04/2015   Per 2D echo 8/16   ED (erectile dysfunction) of organic origin 09/16/2014   Elevated coronary artery calcium   score 02/06/2020   Family history of early CAD 09/23/2021   Frozen shoulder 2018   left   Gastritis    GERD (gastroesophageal reflux disease) 12/27/2015   Herpes simplex type 1 antibody positive 01/28/2015   Herpes simplex type 2 infection 01/28/2015   History of chicken pox    History of obstructive sleep apnea 10/29/2015   HTN (hypertension) 08/05/2014   Hyperlipidemia    Left shoulder pain 02/20/2017   Mood disorder (HCC) 08/25/2016   Osteoarthritis of right hand 03/31/2022   Preventative health care 09/16/2014   Right inguinal pain 10/25/2020    PSH: Past Surgical History:  Procedure Laterality Date   COLONOSCOPY     around 2015 [facility] in [place]   KNEE SURGERY Left 2000   meninscus repair   SHOULDER ARTHROSCOPY Left 01/2018   UPPER GASTROINTESTINAL ENDOSCOPY      SH: Social History   Tobacco Use   Smoking status: Never   Smokeless tobacco: Never  Vaping Use   Vaping status: Never Used  Substance Use Topics   Alcohol use: No   Drug use: No    ROS: Constitutional:  Negative for fever,  chills, weight loss CV: Negative for chest pain, previous MI, hypertension Respiratory:  Negative for shortness of breath, wheezing, sleep apnea, frequent cough GI:  Negative for nausea, vomiting, bloody stool, GERD  PE: BP 137/81   Pulse 62   Ht 5' 9 (1.753 m)   Wt 184 lb (83.5 kg)   BMI 27.17 kg/m  GENERAL APPEARANCE:  Well appearing, well developed, well nourished, NAD HEENT:  Atraumatic, normocephalic, oropharynx clear NECK:  Supple without lymphadenopathy or thyromegaly ABDOMEN:  Soft, non-tender, no masses EXTREMITIES:  Moves all extremities well, without clubbing, cyanosis, or edema NEUROLOGIC:  Alert and oriented x 3, normal gait, CN II-XII grossly intact MENTAL STATUS:  appropriate BACK:  Non-tender to palpation, No CVAT SKIN:  Warm, dry, and intact   Results: U/A: 0-5 WBCs, 0-2 RBCs  PVR = 22 mL

## 2024-01-23 DIAGNOSIS — C44219 Basal cell carcinoma of skin of left ear and external auricular canal: Secondary | ICD-10-CM | POA: Diagnosis not present

## 2024-01-24 DIAGNOSIS — H2513 Age-related nuclear cataract, bilateral: Secondary | ICD-10-CM | POA: Diagnosis not present

## 2024-01-24 DIAGNOSIS — H43823 Vitreomacular adhesion, bilateral: Secondary | ICD-10-CM | POA: Diagnosis not present

## 2024-01-24 DIAGNOSIS — H11153 Pinguecula, bilateral: Secondary | ICD-10-CM | POA: Diagnosis not present

## 2024-01-24 DIAGNOSIS — H25013 Cortical age-related cataract, bilateral: Secondary | ICD-10-CM | POA: Diagnosis not present

## 2024-01-24 DIAGNOSIS — E119 Type 2 diabetes mellitus without complications: Secondary | ICD-10-CM | POA: Diagnosis not present

## 2024-01-24 LAB — HM DIABETES EYE EXAM

## 2024-01-25 DIAGNOSIS — F3181 Bipolar II disorder: Secondary | ICD-10-CM | POA: Diagnosis not present

## 2024-01-25 DIAGNOSIS — F410 Panic disorder [episodic paroxysmal anxiety] without agoraphobia: Secondary | ICD-10-CM | POA: Diagnosis not present

## 2024-02-14 ENCOUNTER — Ambulatory Visit: Admitting: Urology

## 2024-02-15 ENCOUNTER — Other Ambulatory Visit: Payer: Self-pay | Admitting: Family

## 2024-02-15 DIAGNOSIS — I10 Essential (primary) hypertension: Secondary | ICD-10-CM

## 2024-02-20 DIAGNOSIS — H52221 Regular astigmatism, right eye: Secondary | ICD-10-CM | POA: Diagnosis not present

## 2024-02-20 DIAGNOSIS — H2511 Age-related nuclear cataract, right eye: Secondary | ICD-10-CM | POA: Diagnosis not present

## 2024-02-20 DIAGNOSIS — H25811 Combined forms of age-related cataract, right eye: Secondary | ICD-10-CM | POA: Diagnosis not present

## 2024-02-21 DIAGNOSIS — F4322 Adjustment disorder with anxiety: Secondary | ICD-10-CM | POA: Diagnosis not present

## 2024-02-21 DIAGNOSIS — G4700 Insomnia, unspecified: Secondary | ICD-10-CM | POA: Diagnosis not present

## 2024-02-21 DIAGNOSIS — F4312 Post-traumatic stress disorder, chronic: Secondary | ICD-10-CM | POA: Diagnosis not present

## 2024-02-21 DIAGNOSIS — Z961 Presence of intraocular lens: Secondary | ICD-10-CM | POA: Diagnosis not present

## 2024-02-26 DIAGNOSIS — Z961 Presence of intraocular lens: Secondary | ICD-10-CM | POA: Diagnosis not present

## 2024-03-10 ENCOUNTER — Ambulatory Visit: Admitting: Urology

## 2024-03-26 ENCOUNTER — Ambulatory Visit: Admitting: Urology

## 2024-04-02 ENCOUNTER — Ambulatory Visit: Admitting: Family

## 2024-04-04 ENCOUNTER — Encounter: Payer: Self-pay | Admitting: Urology

## 2024-04-04 ENCOUNTER — Ambulatory Visit: Admitting: Urology

## 2024-04-04 VITALS — BP 158/90 | HR 65 | Ht 71.0 in | Wt 181.0 lb

## 2024-04-04 DIAGNOSIS — N401 Enlarged prostate with lower urinary tract symptoms: Secondary | ICD-10-CM

## 2024-04-04 DIAGNOSIS — N529 Male erectile dysfunction, unspecified: Secondary | ICD-10-CM | POA: Diagnosis not present

## 2024-04-04 DIAGNOSIS — N138 Other obstructive and reflux uropathy: Secondary | ICD-10-CM | POA: Diagnosis not present

## 2024-04-04 DIAGNOSIS — R351 Nocturia: Secondary | ICD-10-CM | POA: Diagnosis not present

## 2024-04-04 LAB — URINALYSIS, ROUTINE W REFLEX MICROSCOPIC
Glucose, UA: NEGATIVE
Leukocytes,UA: NEGATIVE
Nitrite, UA: NEGATIVE
Protein,UA: NEGATIVE
RBC, UA: NEGATIVE
Specific Gravity, UA: 1.015 (ref 1.005–1.030)
Urobilinogen, Ur: 0.2 mg/dL (ref 0.2–1.0)
pH, UA: 6.5 (ref 5.0–7.5)

## 2024-04-04 LAB — MICROSCOPIC EXAMINATION

## 2024-04-04 MED ORDER — TADALAFIL 5 MG PO TABS: ORAL_TABLET | ORAL | 3 refills | Status: AC

## 2024-04-04 NOTE — Progress Notes (Signed)
 Assessment: 1. BPH with obstruction/lower urinary tract symptoms   2. Nocturia   3. Organic impotence     Plan: Recommend that he complete the voiding diary x 3 days. Discontinue Myrbetriq . Continue tadalafil  5 mg daily. Will review voiding diary when available. Return to office in 6-8 weeks  Chief Complaint: Chief Complaint  Patient presents with   Benign Prostatic Hypertrophy    HPI: Francisco Lopez is a 63 y.o. male who presents for continued evaluation of BPH with lower urinary tract symptoms and ED. He was previously followed by Dr. Shona and was last seen in April 2025. At that time, he was having increased lower urinary tract symptoms with increased frequency, urgency, nocturia, and rare urge incontinence. IPSS = 22 DRE demonstrated a 30 g benign feeling prostate. He was started on Vesicare  5 mg daily and continued on tamsulosin 0.4 mg daily as well as tadalafil  5 mg daily.  PSA DATA: 02/2015              0.52 07/2016             0.58 01/2018             1.08 02/2019             0.93 03/2020           0.88  10/2021             1.05 7/25   1.3  At his visit in July 2025, he continued on tamsulosin and solifenacin  10 mg.  He initially felt like the solifenacin  improved his nocturia.  He  noted some improvement in his daytime urgency.  He continued with nocturia x 2 which is the most bothersome symptom for him.  No dysuria or gross hematuria. IPSS = 14/3. He was changed to Myrbetriq  50 mg daily.  He returns today for follow-up.  He is no longer taking tamsulosin.  He continues on tadalafil  5 mg daily.  He has not seen a significant change in his nocturia with Myrbetriq .  The nocturia remains the most bothersome symptom for him. IPSS = 16/3.  Portions of the above documentation were copied from a prior visit for review purposes only.  Allergies: No Known Allergies  PMH: Past Medical History:  Diagnosis Date   Anxiety    Basal cell carcinoma 2006   BPH with  obstruction/lower urinary tract symptoms 10/19/2014   Carpal tunnel syndrome 02/07/2016   Chronic rhinitis 07/24/2016   Depression    Diastolic dysfunction 02/04/2015   Per 2D echo 8/16   ED (erectile dysfunction) of organic origin 09/16/2014   Elevated coronary artery calcium  score 02/06/2020   Family history of early CAD 09/23/2021   Frozen shoulder 2018   left   Gastritis    GERD (gastroesophageal reflux disease) 12/27/2015   Herpes simplex type 1 antibody positive 01/28/2015   Herpes simplex type 2 infection 01/28/2015   History of chicken pox    History of obstructive sleep apnea 10/29/2015   HTN (hypertension) 08/05/2014   Hyperlipidemia    Left shoulder pain 02/20/2017   Mood disorder 08/25/2016   Osteoarthritis of right hand 03/31/2022   Preventative health care 09/16/2014   Right inguinal pain 10/25/2020    PSH: Past Surgical History:  Procedure Laterality Date   COLONOSCOPY     around 2015 [facility] in [place]   KNEE SURGERY Left 2000   meninscus repair   SHOULDER ARTHROSCOPY Left 01/2018   UPPER GASTROINTESTINAL ENDOSCOPY  SH: Social History   Tobacco Use   Smoking status: Never   Smokeless tobacco: Never  Vaping Use   Vaping status: Never Used  Substance Use Topics   Alcohol use: No   Drug use: No    ROS: Constitutional:  Negative for fever, chills, weight loss CV: Negative for chest pain, previous MI, hypertension Respiratory:  Negative for shortness of breath, wheezing, sleep apnea, frequent cough GI:  Negative for nausea, vomiting, bloody stool, GERD  PE: BP (!) 158/90   Pulse 65   Ht 5' 11 (1.803 m)   Wt 181 lb (82.1 kg)   BMI 25.24 kg/m  GENERAL APPEARANCE:  Well appearing, well developed, well nourished, NAD HEENT:  Atraumatic, normocephalic, oropharynx clear NECK:  Supple without lymphadenopathy or thyromegaly ABDOMEN:  Soft, non-tender, no masses EXTREMITIES:  Moves all extremities well, without clubbing, cyanosis, or  edema NEUROLOGIC:  Alert and oriented x 3, normal gait, CN II-XII grossly intact MENTAL STATUS:  appropriate BACK:  Non-tender to palpation, No CVAT SKIN:  Warm, dry, and intact  Results: U/A: 0-5 WBCs, 0-2 RBCs

## 2024-04-08 ENCOUNTER — Ambulatory Visit: Admitting: Cardiology

## 2024-04-08 ENCOUNTER — Ambulatory Visit: Admitting: Family

## 2024-04-08 ENCOUNTER — Encounter: Payer: Self-pay | Admitting: Family

## 2024-04-08 ENCOUNTER — Telehealth: Payer: Self-pay | Admitting: Family

## 2024-04-08 VITALS — BP 139/70 | HR 68 | Temp 98.5°F | Resp 16 | Ht 71.0 in | Wt 186.0 lb

## 2024-04-08 DIAGNOSIS — E785 Hyperlipidemia, unspecified: Secondary | ICD-10-CM | POA: Diagnosis not present

## 2024-04-08 DIAGNOSIS — E119 Type 2 diabetes mellitus without complications: Secondary | ICD-10-CM

## 2024-04-08 DIAGNOSIS — I2510 Atherosclerotic heart disease of native coronary artery without angina pectoris: Secondary | ICD-10-CM | POA: Diagnosis not present

## 2024-04-08 DIAGNOSIS — I10 Essential (primary) hypertension: Secondary | ICD-10-CM | POA: Diagnosis not present

## 2024-04-08 DIAGNOSIS — D696 Thrombocytopenia, unspecified: Secondary | ICD-10-CM | POA: Diagnosis not present

## 2024-04-08 DIAGNOSIS — F32A Depression, unspecified: Secondary | ICD-10-CM

## 2024-04-08 DIAGNOSIS — K2100 Gastro-esophageal reflux disease with esophagitis, without bleeding: Secondary | ICD-10-CM

## 2024-04-08 LAB — LIPID PANEL
Cholesterol: 96 mg/dL (ref <200)
HDL: 57 mg/dL (ref >=40)
LDL Cholesterol (Calc): 15 mg/dL
Non-HDL Cholesterol (Calc): 39 mg/dL (ref <130)
Total CHOL/HDL Ratio: 1.7 (calc) (ref <5.0)
Triglycerides: 153 mg/dL — ABNORMAL HIGH (ref <150)

## 2024-04-08 MED ORDER — METOPROLOL SUCCINATE ER 50 MG PO TB24: 50.0000 mg | ORAL_TABLET | Freq: Every day | ORAL | 1 refills | Status: AC

## 2024-04-08 MED ORDER — ASPIRIN 81 MG PO TBEC: 81.0000 mg | DELAYED_RELEASE_TABLET | Freq: Every day | ORAL | Status: AC

## 2024-04-08 MED ORDER — VALSARTAN 320 MG PO TABS: 320.0000 mg | ORAL_TABLET | Freq: Every day | ORAL | 0 refills | Status: AC

## 2024-04-08 MED ORDER — ATORVASTATIN CALCIUM 40 MG PO TABS: 40.0000 mg | ORAL_TABLET | ORAL | 3 refills | Status: AC

## 2024-04-08 MED ORDER — PANTOPRAZOLE SODIUM 20 MG PO TBEC: 20.0000 mg | DELAYED_RELEASE_TABLET | Freq: Every day | ORAL | 0 refills | Status: AC

## 2024-04-08 MED ORDER — AMLODIPINE BESYLATE 10 MG PO TABS: 10.0000 mg | ORAL_TABLET | Freq: Every day | ORAL | 1 refills | Status: AC

## 2024-04-08 NOTE — Assessment & Plan Note (Addendum)
 BP stable, continues amlodipine , metoprolol , valsartan .

## 2024-04-08 NOTE — Assessment & Plan Note (Addendum)
 Lab Results  Component Value Date   HGBA1C 6.5 08/17/2023   HGBA1C 5.6 06/26/2017   HGBA1C 5.5 01/05/2016   Lab Results  Component Value Date   MICROALBUR 2.0 (H) 08/29/2023   LDLCALC 18 03/27/2023   CREATININE 0.85 09/10/2023   Discussed DM diet.  On ARB for microalbuminuria.

## 2024-04-08 NOTE — Assessment & Plan Note (Signed)
 Non-obstructive CAD.  Denies recent anginal symptoms. Not currently on aspirin . Recommend restart 81mg .  Continues repatha , beta blocker, statin. Requesting referral to a new cardiologist. Referral placed.

## 2024-04-08 NOTE — Assessment & Plan Note (Signed)
 Stable on pantoprazole .

## 2024-04-08 NOTE — Assessment & Plan Note (Addendum)
 Lab Results  Component Value Date   CHOL 109 03/27/2023   HDL 41.20 03/27/2023   LDLCALC 18 03/27/2023   LDLDIRECT 97.0 12/27/2015   TRIG 248.0 (H) 03/27/2023   CHOLHDL 3 03/27/2023   Maintained on repatha /statin, update lipid panel.

## 2024-04-08 NOTE — Patient Instructions (Addendum)
 VISIT SUMMARY:  Today, you had a follow-up visit to manage your type 2 diabetes, coronary artery disease, and other health conditions. We discussed your current medications, symptoms, and made plans for further evaluations and adjustments to your treatment.  YOUR PLAN:  ATHEROSCLEROTIC HEART DISEASE OF NATIVE CORONARY ARTERY: You have narrowing in your coronary artery and are concerned about your heart health. -You will be referred to a new cardiologist at [facility] for further evaluation and management. -Restart taking low-dose aspirin  81 mg daily. -Monitor for symptoms such as chest pain, shortness of breath, or changes in stamina.  TYPE 2 DIABETES MELLITUS: You are managing your diabetes with dietary changes and your last A1c was 6.5%. -We will check your A1c level today. -Continue with dietary modifications, reducing carbohydrates and sweets. -Engage in regular cardiovascular exercise and maintain your current weight.  ESSENTIAL HYPERTENSION: Your blood pressure is well-controlled with your current medications. -Ensure your valsartan  prescription is updated and refilled. -Continue your current antihypertensive regimen.  HYPERLIPIDEMIA: You are on Repatha  and atorvastatin  for cholesterol management, but have concerns about the cost of Repatha . -We will update your cholesterol panel. -Discuss your Repatha  and atorvastatin  regimen with your new cardiologist.  GASTROESOPHAGEAL REFLUX DISEASE WITH ESOPHAGITIS: You are managing your reflux with pantoprazole  and dietary changes. -Continue taking pantoprazole . -Maintain dietary modifications, including avoiding milk.  THROMBOCYTOPENIA: You have had mild thrombocytopenia in the past. -We will recheck your platelet count with today's blood work.

## 2024-04-08 NOTE — Assessment & Plan Note (Signed)
 Maintained on depakote and a new med that he will send me via mychart.

## 2024-04-08 NOTE — Progress Notes (Signed)
 Subjective:     Patient ID: Francisco Lopez, male    DOB: 28-Aug-1960, 63 y.o.   MRN: [id number]  Chief Complaint  Patient presents with   Hypertension    Here for follow up    Hypertension    Discussed the use of AI scribe software for clinical note transcription with the patient, who gave verbal consent to proceed.  History of Present Illness  [name] is a 63 year old male with type 2 diabetes and coronary artery disease who presents for routine follow-up and management of his conditions. He manages type 2 diabetes, initially diagnosed with an A1c of 6.5%. His weight increased to 186 pounds after relaxing his diet.   He is concerned about coronary artery disease and has been on Repatha  for two years. He experiences occasional dizziness, balance issues, and hears his heartbeat loudly when lying down. His family history includes heart disease, with his mother deceased from it and his brother having had stents placed. He does not smoke.   He takes amlodipine , metoprolol , and valsartan  for blood pressure. He ran out of valsartan  320 mg and takes two 160 mg tablets instead. He also takes atorvastatin  and pantoprazole . He stopped drinking milk due to heartburn. He is on psychiatric medications, sertraline changed to a new medication recently, and works with Dr. Vincente for psychiatric care. His mood is stable, and he plans to go on a cruise. He received flu and pneumonia vaccinations. He reports shoulder pain, numbness in his right hand, and dizziness. He has a history of cataract surgery and is scheduled for another procedure in November. He switched pharmacies from [facility] to [facility] and is ensuring his medications are refilled appropriately.      Health Maintenance Due  Topic Date Due   HEMOGLOBIN A1C  02/17/2024    Past Medical History:  Diagnosis Date   Anxiety    Basal cell carcinoma 2006   BPH with obstruction/lower urinary tract symptoms 10/19/2014   Carpal tunnel syndrome  02/07/2016   Chronic rhinitis 07/24/2016   Depression    Diastolic dysfunction 02/04/2015   Per 2D echo 8/16   ED (erectile dysfunction) of organic origin 09/16/2014   Elevated coronary artery calcium  score 02/06/2020   Family history of early CAD 09/23/2021   Frozen shoulder 2018   left   Gastritis    GERD (gastroesophageal reflux disease) 12/27/2015   Herpes simplex type 1 antibody positive 01/28/2015   Herpes simplex type 2 infection 01/28/2015   History of chicken pox    History of obstructive sleep apnea 10/29/2015   HTN (hypertension) 08/05/2014   Hyperlipidemia    Left shoulder pain 02/20/2017   Mood disorder 08/25/2016   Osteoarthritis of right hand 03/31/2022   Preventative health care 09/16/2014   Right inguinal pain 10/25/2020    Past Surgical History:  Procedure Laterality Date   BASAL CELL CARCINOMA EXCISION  2025   LEFT EAR   CATARACT EXTRACTION Right 2025   COLONOSCOPY     around 2015 [facility] in [place]   KNEE SURGERY Left 2000   meninscus repair   SHOULDER ARTHROSCOPY Left 01/2018   UPPER GASTROINTESTINAL ENDOSCOPY      Family History  Problem Relation Age of Onset   Diabetes Mother        type II   Anxiety disorder Mother    Hypertension Father    Alcohol abuse Father    Liver cancer Sister    Hemachromatosis Sister    COPD Sister  died from covid complication   Diabetes Mellitus II Sister    Hemachromatosis Brother    Liver cancer Brother    CAD Brother        smoker   CAD Brother        smoker   CAD Paternal Grandmother    COPD Paternal Grandfather    Colon cancer Neg Hx    Esophageal cancer Neg Hx    Stomach cancer Neg Hx    Rectal cancer Neg Hx     Social History   Socioeconomic History   Marital status: Divorced    Spouse name: Not on file   Number of children: 1   Years of education: Not on file   Highest education level: 8th grade  Occupational History   Not on file  Tobacco Use   Smoking status: Never    Smokeless tobacco: Never  Vaping Use   Vaping status: Never Used  Substance and Sexual Activity   Alcohol use: No   Drug use: No   Sexual activity: Yes  Other Topics Concern   Not on file  Social History Narrative   Separated   1 child (25 yr old daughter)- lives in [place]   Drives 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[employer], 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   Social Drivers of Health   Financial Resource Strain: Patient Declined (04/08/2024)   Overall Financial Resource Strain (CARDIA)    Difficulty of Paying Living Expenses: Patient declined  Food Insecurity: Unknown (04/08/2024)   Hunger Vital Sign    Worried About Running Out of Food in the Last Year: Patient declined    Ran Out of Food in the Last Year: Not on file  Transportation Needs: Unknown (04/08/2024)   PRAPARE - Administrator, Civil Service (Medical): Not on file    Lack of Transportation (Non-Medical): Patient declined  Physical Activity: Sufficiently Active (06/19/2023)   Exercise Vital Sign    Days of Exercise per Week: 7 days    Minutes of Exercise per Session: 30 min  Stress: No Stress Concern Present (06/19/2023)   [employer] of Occupational Health - Occupational Stress Questionnaire    Feeling of Stress : Not at all  Social Connections: Unknown (04/08/2024)   Social Connection and Isolation Panel    Frequency of Communication with Friends and Family: Patient declined    Frequency of Social Gatherings with Friends and Family: Patient declined    Attends Religious Services: Patient declined    Database Administrator or Organizations: Patient declined    Attends Engineer, Structural: Not on file    Marital Status: Patient declined  Intimate Partner Violence: Not on file    Outpatient Medications Prior to Visit  Medication Sig Dispense Refill   divalproex (DEPAKOTE ER) 500 MG 24 hr tablet Take 1,000 mg by mouth 2 (two) times daily.     Evolocumab  (REPATHA ) 140 MG/ML SOSY INJECT 140 MG SUBCUTANEOUSLY  EVERY 14 DAYS 6 mL 3   Multiple Vitamins-Minerals (MENS MULTIVITAMIN PLUS PO) Take 1 tablet by mouth daily in the afternoon.     Omega-3 Fatty Acids (FISH OIL ) 1000 MG CAPS Take 2 capsules (2,000 mg total) by mouth 2 (two) times daily. 180 capsule 3   tadalafil  (CIALIS ) 5 MG tablet Take one tablet once daily.  If needed may take 5 mg additional dose prior to intercourse 90 tablet 3   valACYclovir  (VALTREX ) 500 MG tablet TAKE 1 TABLET (500 MG TOTAL) BY MOUTH DAILY. 90  tablet 1   albuterol  (VENTOLIN  HFA) 108 (90 Base) MCG/ACT inhaler Inhale 2 puffs into the lungs every 6 (six) hours as needed for wheezing or shortness of breath. 6.7 g 0   amLODipine  (NORVASC ) 10 MG tablet TAKE 1 TABLET BY MOUTH EVERY DAY 90 tablet 1   atorvastatin  (LIPITOR) 40 MG tablet Take 1 tablet (40 mg total) by mouth every other day. 45 tablet 3   metoprolol  succinate (TOPROL  XL) 50 MG 24 hr tablet Take 1 tablet (50 mg total) by mouth daily. Take with or immediately following a meal. 90 tablet 1   pantoprazole  (PROTONIX ) 20 MG tablet TAKE 1 TABLET BY MOUTH EVERY DAY 90 tablet 0   sertraline (ZOLOFT) 100 MG tablet Take 100 mg by mouth daily.     valsartan  (DIOVAN ) 320 MG tablet Take 1 tablet (320 mg total) by mouth daily. 90 tablet 0   No facility-administered medications prior to visit.    No Known Allergies  ROS    See HPI Objective:    Physical Exam Constitutional:      General: He is not in acute distress.    Appearance: He is well-developed.  HENT:     Head: Normocephalic and atraumatic.     Right Ear: Tympanic membrane and ear canal normal.     Left Ear: Tympanic membrane and ear canal normal.  Cardiovascular:     Rate and Rhythm: Normal rate and regular rhythm.     Heart sounds: No murmur heard.    Comments: Neg carotid bruit bilaterally Pulmonary:     Effort: Pulmonary effort is normal. No respiratory distress.     Breath sounds: Normal breath sounds. No wheezing or rales.  Skin:    General: Skin is  warm and dry.  Neurological:     Mental Status: He is alert and oriented to person, place, and time.  Psychiatric:        Behavior: Behavior normal.        Thought Content: Thought content normal.      BP 139/70 (BP Location: Right Arm, Patient Position: Sitting, Cuff Size: Normal)   Pulse 68   Temp 98.5 F (36.9 C) (Oral)   Resp 16   Ht 5' 11 (1.803 m)   Wt 186 lb (84.4 kg)   SpO2 99%   BMI 25.94 kg/m  Wt Readings from Last 3 Encounters:  04/08/24 186 lb (84.4 kg)  04/04/24 181 lb (82.1 kg)  01/02/24 184 lb (83.5 kg)       Assessment & Plan:   Problem List Items Addressed This Visit       Unprioritized   Hyperlipidemia   Lab Results  Component Value Date   CHOL 109 03/27/2023   HDL 41.20 03/27/2023   LDLCALC 18 03/27/2023   LDLDIRECT 97.0 12/27/2015   TRIG 248.0 (H) 03/27/2023   CHOLHDL 3 03/27/2023   Maintained on repatha /statin, update lipid panel.        Relevant Medications   aspirin  EC 81 MG tablet   metoprolol  succinate (TOPROL  XL) 50 MG 24 hr tablet   valsartan  (DIOVAN ) 320 MG tablet   amLODipine  (NORVASC ) 10 MG tablet   atorvastatin  (LIPITOR) 40 MG tablet   Other Relevant Orders   Lipid panel   HTN (hypertension)   BP stable, continues amlodipine , metoprolol , valsartan .        Relevant Medications   aspirin  EC 81 MG tablet   metoprolol  succinate (TOPROL  XL) 50 MG 24 hr tablet   valsartan  (DIOVAN ) 320  MG tablet   amLODipine  (NORVASC ) 10 MG tablet   atorvastatin  (LIPITOR) 40 MG tablet   GERD (gastroesophageal reflux disease)   Stable on pantoprazole .       Relevant Medications   pantoprazole  (PROTONIX ) 20 MG tablet   Depression   Maintained on depakote and a new med that he will send me via mychart.       Coronary artery disease involving native coronary artery of native heart without angina pectoris - Primary   Non-obstructive CAD.  Denies recent anginal symptoms. Not currently on aspirin . Recommend restart 81mg .  Continues repatha ,  beta blocker, statin. Requesting referral to a new cardiologist. Referral placed.       Relevant Medications   aspirin  EC 81 MG tablet   metoprolol  succinate (TOPROL  XL) 50 MG 24 hr tablet   valsartan  (DIOVAN ) 320 MG tablet   amLODipine  (NORVASC ) 10 MG tablet   atorvastatin  (LIPITOR) 40 MG tablet   Other Relevant Orders   Ambulatory referral to Cardiology   Controlled type 2 diabetes mellitus without complication, without long-term current use of insulin (HCC)   Lab Results  Component Value Date   HGBA1C 6.5 08/17/2023   HGBA1C 5.6 06/26/2017   HGBA1C 5.5 01/05/2016   Lab Results  Component Value Date   MICROALBUR 2.0 (H) 08/29/2023   LDLCALC 18 03/27/2023   CREATININE 0.85 09/10/2023   Discussed DM diet.  On ARB for microalbuminuria.       Relevant Medications   aspirin  EC 81 MG tablet   valsartan  (DIOVAN ) 320 MG tablet   atorvastatin  (LIPITOR) 40 MG tablet   Other Relevant Orders   HgB A1c   Comp Met (CMET)   Other Visit Diagnoses       Thrombocytopenia       Relevant Orders   CBC w/Diff       I have discontinued Sylvan Cendejas's sertraline and albuterol . I have also changed his amLODipine  and pantoprazole . Additionally, I am having him start on aspirin  EC. Lastly, I am having him maintain his Multiple Vitamins-Minerals (MENS MULTIVITAMIN PLUS PO), Fish Oil , divalproex, valACYclovir , Repatha , tadalafil , metoprolol  succinate, valsartan , and atorvastatin .  Meds ordered this encounter  Medications   aspirin  EC 81 MG tablet    Sig: Take 1 tablet (81 mg total) by mouth daily. Swallow whole.    Supervising Provider:   DOMENICA BLACKBIRD A [[id number]]   metoprolol  succinate (TOPROL  XL) 50 MG 24 hr tablet    Sig: Take 1 tablet (50 mg total) by mouth daily. Take with or immediately following a meal.    Dispense:  90 tablet    Refill:  1    Supervising Provider:   DOMENICA BLACKBIRD A [[id number]]   valsartan  (DIOVAN ) 320 MG tablet    Sig: Take 1 tablet (320 mg total) by mouth daily.     Dispense:  90 tablet    Refill:  0    Supervising Provider:   DOMENICA BLACKBIRD A [[id number]]   amLODipine  (NORVASC ) 10 MG tablet    Sig: Take 1 tablet (10 mg total) by mouth daily.    Dispense:  90 tablet    Refill:  1    Supervising Provider:   DOMENICA BLACKBIRD A [[id number]]   atorvastatin  (LIPITOR) 40 MG tablet    Sig: Take 1 tablet (40 mg total) by mouth every other day.    Dispense:  45 tablet    Refill:  3    Supervising Provider:   DOMENICA BLACKBIRD A [[id number]]   pantoprazole  (PROTONIX )  20 MG tablet    Sig: Take 1 tablet (20 mg total) by mouth daily.    Dispense:  90 tablet    Refill:  0    Please call [phone] to schedule an office visit for more refills.    Supervising Provider:   DOMENICA BLACKBIRD A [phone]

## 2024-04-08 NOTE — Telephone Encounter (Signed)
 Information abstracted from [facility]

## 2024-04-08 NOTE — Telephone Encounter (Signed)
 Please request shot record for Prevnar at [facility] on [address].

## 2024-04-09 ENCOUNTER — Ambulatory Visit: Payer: Self-pay | Admitting: Family

## 2024-04-09 LAB — CBC WITH DIFFERENTIAL/PLATELET
Basophils Absolute: 0 K/uL (ref 0.0–0.1)
Basophils Relative: 0.7 % (ref 0.0–3.0)
Eosinophils Absolute: 0 K/uL (ref 0.0–0.7)
Eosinophils Relative: 0.7 % (ref 0.0–5.0)
HCT: 43.5 % (ref 39.0–52.0)
Hemoglobin: 14.7 g/dL (ref 13.0–17.0)
Lymphocytes Relative: 45.6 % (ref 12.0–46.0)
Lymphs Abs: 2.8 K/uL (ref 0.7–4.0)
MCHC: 33.9 g/dL (ref 30.0–36.0)
MCV: 94.3 fl (ref 78.0–100.0)
Monocytes Absolute: 0.4 K/uL (ref 0.1–1.0)
Monocytes Relative: 6.7 % (ref 3.0–12.0)
Neutro Abs: 2.8 K/uL (ref 1.4–7.7)
Neutrophils Relative %: 46.3 % (ref 43.0–77.0)
Platelets: 134 K/uL — ABNORMAL LOW (ref 150.0–400.0)
RBC: 4.61 Mil/uL (ref 4.22–5.81)
RDW: 13.5 % (ref 11.5–15.5)
WBC: 6.1 K/uL (ref 4.0–10.5)

## 2024-04-09 LAB — HEMOGLOBIN A1C: Hgb A1c MFr Bld: 5.8 % (ref 4.6–6.5)

## 2024-04-09 LAB — COMPREHENSIVE METABOLIC PANEL WITH GFR
ALT: 13 U/L (ref 0–53)
AST: 13 U/L (ref 0–37)
Albumin: 4.4 g/dL (ref 3.5–5.2)
Alkaline Phosphatase: 39 U/L (ref 39–117)
BUN: 15 mg/dL (ref 6–23)
CO2: 26 meq/L (ref 19–32)
Calcium: 9.3 mg/dL (ref 8.4–10.5)
Chloride: 104 meq/L (ref 96–112)
Creatinine, Ser: 0.87 mg/dL (ref 0.40–1.50)
GFR: 91.95 mL/min (ref >60.00)
Glucose, Bld: 78 mg/dL (ref 70–99)
Potassium: 3.8 meq/L (ref 3.5–5.1)
Sodium: 140 meq/L (ref 135–145)
Total Bilirubin: 0.3 mg/dL (ref 0.2–1.2)
Total Protein: 6.5 g/dL (ref 6.0–8.3)

## 2024-04-17 DIAGNOSIS — H2512 Age-related nuclear cataract, left eye: Secondary | ICD-10-CM | POA: Diagnosis not present

## 2024-05-06 DIAGNOSIS — H2512 Age-related nuclear cataract, left eye: Secondary | ICD-10-CM | POA: Diagnosis not present

## 2024-05-07 DIAGNOSIS — Z961 Presence of intraocular lens: Secondary | ICD-10-CM | POA: Diagnosis not present

## 2024-05-16 ENCOUNTER — Encounter: Payer: Self-pay | Admitting: Urology

## 2024-05-16 ENCOUNTER — Ambulatory Visit: Admitting: Urology

## 2024-05-16 VITALS — BP 157/74 | HR 69 | Ht 70.0 in | Wt 184.0 lb

## 2024-05-16 DIAGNOSIS — N138 Other obstructive and reflux uropathy: Secondary | ICD-10-CM

## 2024-05-16 DIAGNOSIS — R351 Nocturia: Secondary | ICD-10-CM | POA: Diagnosis not present

## 2024-05-16 DIAGNOSIS — N529 Male erectile dysfunction, unspecified: Secondary | ICD-10-CM

## 2024-05-16 DIAGNOSIS — N401 Enlarged prostate with lower urinary tract symptoms: Secondary | ICD-10-CM

## 2024-05-16 LAB — URINALYSIS, ROUTINE W REFLEX MICROSCOPIC
Bilirubin, UA: NEGATIVE
Glucose, UA: NEGATIVE
Leukocytes,UA: NEGATIVE
Nitrite, UA: NEGATIVE
Protein,UA: NEGATIVE
RBC, UA: NEGATIVE
Specific Gravity, UA: 1.02 (ref 1.005–1.030)
Urobilinogen, Ur: 0.2 mg/dL (ref 0.2–1.0)
pH, UA: 7 (ref 5.0–7.5)

## 2024-05-16 LAB — MICROSCOPIC EXAMINATION: Bacteria, UA: NONE SEEN

## 2024-05-16 MED ORDER — TROSPIUM CHLORIDE 20 MG PO TABS: 20.0000 mg | ORAL_TABLET | Freq: Every day | ORAL | 5 refills | Status: DC

## 2024-05-16 NOTE — Progress Notes (Signed)
 Assessment: 1. BPH with obstruction/lower urinary tract symptoms   2. Nocturia   3. Organic impotence     Plan: Reviewed the patient's voiding diary today with results as noted below. Recommended that he increase his fluid intake earlier in the day. Continue tadalafil  5 mg daily. Trial of trospium  20 mg at bedtime.  Rx sent. Return to office in 8 weeks  Chief Complaint: Chief Complaint  Patient presents with   Benign Prostatic Hypertrophy    HPI: Francisco Lopez is a 63 y.o. male who presents for continued evaluation of BPH with lower urinary tract symptoms and ED. He was previously followed by Dr. Shona and was last seen in April 2025. At that time, he was having increased lower urinary tract symptoms with increased frequency, urgency, nocturia, and rare urge incontinence. IPSS = 22 DRE demonstrated a 30 g benign feeling prostate. He was started on Vesicare  5 mg daily and continued on tamsulosin 0.4 mg daily as well as tadalafil  5 mg daily.  PSA DATA: 02/2015              0.52 07/2016             0.58 01/2018             1.08 02/2019             0.93 03/2020           0.88  10/2021             1.05 7/25   1.3  At his visit in July 2025, he continued on tamsulosin and solifenacin  10 mg.  He initially felt like the solifenacin  improved his nocturia.  He noted some improvement in his daytime urgency.  He continued with nocturia x 2 which is the most bothersome symptom for him.  No dysuria or gross hematuria. IPSS = 14/3. He was changed to Myrbetriq  50 mg daily.  At his visit in October 2025, he was no longer taking tamsulosin.  He continued on tadalafil  5 mg daily.  He had not seen a significant change in his nocturia with Myrbetriq .  The nocturia remained the most bothersome symptom for him. IPSS = 16/3.  He returns today for follow-up.  He is currently on tadalafil  5 mg daily.  He has continued symptoms of nocturia 2-3 times.  He reports some decrease stream, primarily at night.   He does not have significant daytime symptoms. IPSS = 17/5.  Voiding diary: Voided volumes range from 120-600 mL. Decreased fluid intake:  720ml, 780 ml, 660 ml 24 hr output:  2100 ml, 1350 ml, 2280 ml Nocturnal output: 49%, 0%, 43%  Portions of the above documentation were copied from a prior visit for review purposes only.  Allergies: No Known Allergies  PMH: Past Medical History:  Diagnosis Date   Anxiety    Basal cell carcinoma 2006   BPH with obstruction/lower urinary tract symptoms 10/19/2014   Carpal tunnel syndrome 02/07/2016   Chronic rhinitis 07/24/2016   Depression    Diastolic dysfunction 02/04/2015   Per 2D echo 8/16   ED (erectile dysfunction) of organic origin 09/16/2014   Elevated coronary artery calcium  score 02/06/2020   Family history of early CAD 09/23/2021   Frozen shoulder 2018   left   Gastritis    GERD (gastroesophageal reflux disease) 12/27/2015   Herpes simplex type 1 antibody positive 01/28/2015   Herpes simplex type 2 infection 01/28/2015   History of chicken pox    History of  obstructive sleep apnea 10/29/2015   HTN (hypertension) 08/05/2014   Hyperlipidemia    Left shoulder pain 02/20/2017   Mood disorder 08/25/2016   Osteoarthritis of right hand 03/31/2022   Preventative health care 09/16/2014   Right inguinal pain 10/25/2020    PSH: Past Surgical History:  Procedure Laterality Date   BASAL CELL CARCINOMA EXCISION  2025   LEFT EAR   CATARACT EXTRACTION Right 2025   COLONOSCOPY     around 2015 [facility] in [place]   KNEE SURGERY Left 2000   meninscus repair   SHOULDER ARTHROSCOPY Left 01/2018   UPPER GASTROINTESTINAL ENDOSCOPY      SH: Social History   Tobacco Use   Smoking status: Never   Smokeless tobacco: Never  Vaping Use   Vaping status: Never Used  Substance Use Topics   Alcohol use: No   Drug use: No    ROS: Constitutional:  Negative for fever, chills, weight loss CV: Negative for chest pain,  previous MI, hypertension Respiratory:  Negative for shortness of breath, wheezing, sleep apnea, frequent cough GI:  Negative for nausea, vomiting, bloody stool, GERD  PE: BP (!) 157/74   Pulse 69   Ht 5' 10 (1.778 m)   Wt 184 lb (83.5 kg)   BMI 26.40 kg/m  GENERAL APPEARANCE:  Well appearing, well developed, well nourished, NAD HEENT:  Atraumatic, normocephalic, oropharynx clear NECK:  Supple without lymphadenopathy or thyromegaly ABDOMEN:  Soft, non-tender, no masses EXTREMITIES:  Moves all extremities well, without clubbing, cyanosis, or edema NEUROLOGIC:  Alert and oriented x 3, normal gait, CN II-XII grossly intact MENTAL STATUS:  appropriate BACK:  Non-tender to palpation, No CVAT SKIN:  Warm, dry, and intact  Results: U/A: 0-5 WBCs, 0-2 RBCs

## 2024-05-20 ENCOUNTER — Encounter: Payer: Self-pay | Admitting: *Deleted

## 2024-05-20 ENCOUNTER — Telehealth: Payer: Self-pay | Admitting: *Deleted

## 2024-05-20 DIAGNOSIS — R931 Abnormal findings on diagnostic imaging of heart and coronary circulation: Secondary | ICD-10-CM

## 2024-05-20 DIAGNOSIS — E7841 Elevated Lipoprotein(a): Secondary | ICD-10-CM

## 2024-05-20 DIAGNOSIS — I25118 Atherosclerotic heart disease of native coronary artery with other forms of angina pectoris: Secondary | ICD-10-CM

## 2024-05-20 MED ORDER — REPATHA 140 MG/ML ~~LOC~~ SOSY: 140.0000 mg | PREFILLED_SYRINGE | SUBCUTANEOUS | 1 refills | Status: AC

## 2024-05-20 NOTE — Telephone Encounter (Signed)
 Rx refill sent to pharmacy.

## 2024-05-22 DIAGNOSIS — F99 Mental disorder, not otherwise specified: Secondary | ICD-10-CM | POA: Diagnosis not present

## 2024-05-22 DIAGNOSIS — G4700 Insomnia, unspecified: Secondary | ICD-10-CM | POA: Diagnosis not present

## 2024-05-22 DIAGNOSIS — F4322 Adjustment disorder with anxiety: Secondary | ICD-10-CM | POA: Diagnosis not present

## 2024-05-22 DIAGNOSIS — F4312 Post-traumatic stress disorder, chronic: Secondary | ICD-10-CM | POA: Diagnosis not present

## 2024-06-30 ENCOUNTER — Ambulatory Visit

## 2024-07-17 ENCOUNTER — Encounter: Payer: Self-pay | Admitting: Urology

## 2024-07-17 ENCOUNTER — Ambulatory Visit: Admitting: Urology

## 2024-07-17 VITALS — BP 155/88 | HR 61 | Ht 70.0 in | Wt 179.0 lb

## 2024-07-17 DIAGNOSIS — N401 Enlarged prostate with lower urinary tract symptoms: Secondary | ICD-10-CM

## 2024-07-17 DIAGNOSIS — N529 Male erectile dysfunction, unspecified: Secondary | ICD-10-CM | POA: Diagnosis not present

## 2024-07-17 DIAGNOSIS — R351 Nocturia: Secondary | ICD-10-CM | POA: Diagnosis not present

## 2024-07-17 DIAGNOSIS — N138 Other obstructive and reflux uropathy: Secondary | ICD-10-CM | POA: Diagnosis not present

## 2024-07-17 LAB — URINALYSIS, ROUTINE W REFLEX MICROSCOPIC
Bilirubin, UA: NEGATIVE
Glucose, UA: NEGATIVE
Ketones, UA: NEGATIVE
Leukocytes,UA: NEGATIVE
Nitrite, UA: NEGATIVE
Protein,UA: NEGATIVE
RBC, UA: NEGATIVE
Specific Gravity, UA: 1.015 (ref 1.005–1.030)
Urobilinogen, Ur: 0.2 mg/dL (ref 0.2–1.0)
pH, UA: 7 (ref 5.0–7.5)

## 2024-07-17 MED ORDER — TROSPIUM CHLORIDE 20 MG PO TABS: 20.0000 mg | ORAL_TABLET | Freq: Every day | ORAL | 3 refills | Status: AC

## 2024-07-17 NOTE — Progress Notes (Signed)
 "  Assessment: 1. BPH with obstruction/lower urinary tract symptoms   2. Nocturia   3. Organic impotence     Plan: Continue tadalafil  5 mg daily. Continue trospium  20 mg at bedtime. Return to office in 6 months  Chief Complaint: Chief Complaint  Patient presents with   Benign Prostatic Hypertrophy    HPI: Francisco Lopez is a 64 y.o. male who presents for continued evaluation of BPH with lower urinary tract symptoms and ED. He was previously followed by Dr. Shona. At his visit in April 2025, he was having increased lower urinary tract symptoms with increased frequency, urgency, nocturia, and rare urge incontinence. IPSS = 22 DRE demonstrated a 30 g benign feeling prostate. He was started on Vesicare  5 mg daily and continued on tamsulosin 0.4 mg daily as well as tadalafil  5 mg daily.  PSA DATA: 02/2015              0.52 07/2016             0.58 01/2018             1.08 02/2019             0.93 03/2020           0.88  10/2021             1.05 7/25   1.3  At his visit in July 2025, he continued on tamsulosin and solifenacin  10 mg.  He initially felt like the solifenacin  improved his nocturia.  He noted some improvement in his daytime urgency.  He continued with nocturia x 2 which is the most bothersome symptom for him.  No dysuria or gross hematuria. IPSS = 14/3. He was changed to Myrbetriq  50 mg daily.  At his visit in October 2025, he was no longer taking tamsulosin.  He continued on tadalafil  5 mg daily.  He had not seen a significant change in his nocturia with Myrbetriq .  The nocturia remained the most bothersome symptom for him. IPSS = 16/3.  At his visit in December 2025, he continued on tadalafil  5 mg daily.  He continued with symptoms of nocturia 2-3 times.  He reported some decreased stream, primarily at night.  No significant daytime symptoms. IPSS = 17/5.  Voiding diary: Voided volumes range from 120-600 mL. Decreased fluid intake:  720ml, 780 ml, 660 ml 24 hr output:   2100 ml, 1350 ml, 2280 ml Nocturnal output: 49%, 0%, 43%  He was started on trospium  20 mg nightly in December 2025.  He returns today for follow-up.  He continues on tadalafil  5 mg daily and trospium  20 mg nightly.  His lower inner tract symptoms are improved.  He reports decreased urgency and decreased nocturia with trospium .  No side effects from the medication.  He is not having significant daytime symptoms.  He is overall pleased with his current voiding pattern.  No dysuria or gross hematuria. IPSS = 20/3.  Portions of the above documentation were copied from a prior visit for review purposes only.  Allergies: No Known Allergies  PMH: Past Medical History:  Diagnosis Date   Anxiety    Basal cell carcinoma 2006   BPH with obstruction/lower urinary tract symptoms 10/19/2014   Carpal tunnel syndrome 02/07/2016   Chronic rhinitis 07/24/2016   Depression    Diastolic dysfunction 02/04/2015   Per 2D echo 8/16   ED (erectile dysfunction) of organic origin 09/16/2014   Elevated coronary artery calcium  score 02/06/2020   Family history of  early CAD 09/23/2021   Frozen shoulder 2018   left   Gastritis    GERD (gastroesophageal reflux disease) 12/27/2015   Herpes simplex type 1 antibody positive 01/28/2015   Herpes simplex type 2 infection 01/28/2015   History of chicken pox    History of obstructive sleep apnea 10/29/2015   HTN (hypertension) 08/05/2014   Hyperlipidemia    Left shoulder pain 02/20/2017   Mood disorder 08/25/2016   Osteoarthritis of right hand 03/31/2022   Preventative health care 09/16/2014   Right inguinal pain 10/25/2020    PSH: Past Surgical History:  Procedure Laterality Date   BASAL CELL CARCINOMA EXCISION  2025   LEFT EAR   CATARACT EXTRACTION Right 2025   COLONOSCOPY     around 2015 [facility] in [place]   KNEE SURGERY Left 2000   meninscus repair   SHOULDER ARTHROSCOPY Left 01/2018   UPPER GASTROINTESTINAL ENDOSCOPY      SH: Social  History   Tobacco Use   Smoking status: Never   Smokeless tobacco: Never  Vaping Use   Vaping status: Never Used  Substance Use Topics   Alcohol use: No   Drug use: No    ROS: Constitutional:  Negative for fever, chills, weight loss CV: Negative for chest pain, previous MI, hypertension Respiratory:  Negative for shortness of breath, wheezing, sleep apnea, frequent cough GI:  Negative for nausea, vomiting, bloody stool, GERD  PE: BP (!) 155/88   Pulse 61   Ht 5' 10 (1.778 m)   Wt 179 lb (81.2 kg)   BMI 25.68 kg/m  GENERAL APPEARANCE:  Well appearing, well developed, well nourished, NAD HEENT:  Atraumatic, normocephalic, oropharynx clear NECK:  Supple without lymphadenopathy or thyromegaly ABDOMEN:  Soft, non-tender, no masses EXTREMITIES:  Moves all extremities well, without clubbing, cyanosis, or edema NEUROLOGIC:  Alert and oriented x 3, normal gait, CN II-XII grossly intact MENTAL STATUS:  appropriate BACK:  Non-tender to palpation, No CVAT SKIN:  Warm, dry, and intact  Results: U/A: Negative "

## 2024-07-29 ENCOUNTER — Ambulatory Visit: Admitting: Cardiology

## 2024-10-07 ENCOUNTER — Ambulatory Visit: Admitting: Family

## 2025-01-14 ENCOUNTER — Ambulatory Visit: Admitting: Urology
